# Patient Record
Sex: Male | Born: 1960 | Race: White | Hispanic: No | Smoking: Current some day smoker
Health system: Southern US, Community
[De-identification: ages and names within clinical notes are randomized; demographics above are authoritative.]

## PROBLEM LIST (undated history)

## (undated) DIAGNOSIS — J939 Pneumothorax, unspecified: Secondary | ICD-10-CM

## (undated) DIAGNOSIS — E039 Hypothyroidism, unspecified: Secondary | ICD-10-CM

## (undated) DIAGNOSIS — C3490 Malignant neoplasm of unspecified part of unspecified bronchus or lung: Secondary | ICD-10-CM

## (undated) DIAGNOSIS — E440 Moderate protein-calorie malnutrition: Secondary | ICD-10-CM

## (undated) DIAGNOSIS — F3181 Bipolar II disorder: Secondary | ICD-10-CM

## (undated) DIAGNOSIS — R0602 Shortness of breath: Secondary | ICD-10-CM

## (undated) HISTORY — PX: TURBINATE REDUCTION: SHX6157

## (undated) HISTORY — DX: Malignant neoplasm of unspecified part of unspecified bronchus or lung: C34.90

---

## 2005-11-12 ENCOUNTER — Emergency Department (HOSPITAL_COMMUNITY): Admission: EM | Admit: 2005-11-12 | Discharge: 2005-11-12 | Payer: Self-pay | Admitting: Family Medicine

## 2006-01-04 ENCOUNTER — Ambulatory Visit: Payer: Self-pay | Admitting: Family Medicine

## 2006-01-05 ENCOUNTER — Ambulatory Visit: Payer: Self-pay | Admitting: Nurse Practitioner

## 2006-01-05 ENCOUNTER — Ambulatory Visit: Payer: Self-pay | Admitting: *Deleted

## 2006-01-12 ENCOUNTER — Ambulatory Visit: Payer: Self-pay | Admitting: Family Medicine

## 2006-02-14 ENCOUNTER — Ambulatory Visit: Payer: Self-pay | Admitting: Family Medicine

## 2006-04-18 ENCOUNTER — Ambulatory Visit: Payer: Self-pay | Admitting: Family Medicine

## 2006-05-03 ENCOUNTER — Ambulatory Visit: Payer: Self-pay | Admitting: Family Medicine

## 2006-05-17 ENCOUNTER — Ambulatory Visit: Payer: Self-pay | Admitting: Family Medicine

## 2006-07-31 ENCOUNTER — Ambulatory Visit: Payer: Self-pay | Admitting: Family Medicine

## 2006-09-05 ENCOUNTER — Ambulatory Visit: Payer: Self-pay | Admitting: Family Medicine

## 2006-12-25 ENCOUNTER — Ambulatory Visit: Payer: Self-pay | Admitting: Family Medicine

## 2007-01-17 ENCOUNTER — Emergency Department (HOSPITAL_COMMUNITY): Admission: EM | Admit: 2007-01-17 | Discharge: 2007-01-17 | Payer: Self-pay | Admitting: Emergency Medicine

## 2007-09-05 ENCOUNTER — Encounter (INDEPENDENT_AMBULATORY_CARE_PROVIDER_SITE_OTHER): Payer: Self-pay | Admitting: *Deleted

## 2008-01-17 ENCOUNTER — Ambulatory Visit: Payer: Self-pay | Admitting: Internal Medicine

## 2012-11-25 ENCOUNTER — Inpatient Hospital Stay (HOSPITAL_COMMUNITY)
Admission: EM | Admit: 2012-11-25 | Discharge: 2012-12-01 | DRG: 181 | Disposition: A | Discharge status: Home Health | Payer: Medicaid Other | Source: Ambulatory Visit | Attending: Internal Medicine | Admitting: Internal Medicine

## 2012-11-25 ENCOUNTER — Emergency Department (HOSPITAL_COMMUNITY): Payer: Medicaid Other

## 2012-11-25 ENCOUNTER — Encounter (HOSPITAL_COMMUNITY): Payer: Self-pay | Admitting: Emergency Medicine

## 2012-11-25 DIAGNOSIS — J90 Pleural effusion, not elsewhere classified: Secondary | ICD-10-CM | POA: Diagnosis present

## 2012-11-25 DIAGNOSIS — F3181 Bipolar II disorder: Secondary | ICD-10-CM | POA: Diagnosis present

## 2012-11-25 DIAGNOSIS — Z720 Tobacco use: Secondary | ICD-10-CM | POA: Diagnosis present

## 2012-11-25 DIAGNOSIS — F17200 Nicotine dependence, unspecified, uncomplicated: Secondary | ICD-10-CM | POA: Diagnosis present

## 2012-11-25 DIAGNOSIS — F319 Bipolar disorder, unspecified: Secondary | ICD-10-CM | POA: Diagnosis present

## 2012-11-25 DIAGNOSIS — C3490 Malignant neoplasm of unspecified part of unspecified bronchus or lung: Secondary | ICD-10-CM | POA: Diagnosis present

## 2012-11-25 DIAGNOSIS — J910 Malignant pleural effusion: Principal | ICD-10-CM | POA: Diagnosis present

## 2012-11-25 DIAGNOSIS — K5900 Constipation, unspecified: Secondary | ICD-10-CM | POA: Diagnosis not present

## 2012-11-25 DIAGNOSIS — R0600 Dyspnea, unspecified: Secondary | ICD-10-CM | POA: Diagnosis present

## 2012-11-25 HISTORY — DX: Bipolar II disorder: F31.81

## 2012-11-25 LAB — COMPREHENSIVE METABOLIC PANEL
AST: 15 U/L (ref 0–37)
Albumin: 3.5 g/dL (ref 3.5–5.2)
Alkaline Phosphatase: 98 U/L (ref 39–117)
Chloride: 95 mEq/L — ABNORMAL LOW (ref 96–112)
Potassium: 4.3 mEq/L (ref 3.5–5.1)
Total Bilirubin: 0.4 mg/dL (ref 0.3–1.2)

## 2012-11-25 LAB — CBC
Platelets: 496 10*3/uL — ABNORMAL HIGH (ref 150–400)
RDW: 12.4 % (ref 11.5–15.5)
WBC: 9 10*3/uL (ref 4.0–10.5)

## 2012-11-25 LAB — APTT: aPTT: 29 seconds (ref 24–37)

## 2012-11-25 MED ORDER — ACETAMINOPHEN 650 MG RE SUPP: 650.0000 mg | Freq: Four times a day (QID) | RECTAL | Status: DC | PRN

## 2012-11-25 MED ORDER — SODIUM CHLORIDE 0.9 % IJ SOLN
3.0000 mL | Freq: Two times a day (BID) | INTRAMUSCULAR | Status: DC
  Administered 2012-11-25 – 2012-11-29 (×7): 3 mL via INTRAVENOUS

## 2012-11-25 MED ORDER — LAMOTRIGINE 150 MG PO TABS
150.0000 mg | ORAL_TABLET | Freq: Every day | ORAL | Status: DC
  Administered 2012-11-25 – 2012-11-30 (×6): 150 mg via ORAL
  Filled 2012-11-25 (×6): qty 1

## 2012-11-25 MED ORDER — KETOROLAC TROMETHAMINE 30 MG/ML IJ SOLN
10.0000 mg | Freq: Once | INTRAMUSCULAR | Status: AC
  Administered 2012-11-25: 9.9 mg via INTRAVENOUS
  Filled 2012-11-25: qty 1

## 2012-11-25 MED ORDER — PNEUMOCOCCAL VAC POLYVALENT 25 MCG/0.5ML IJ INJ
0.5000 mL | INJECTION | INTRAMUSCULAR | Status: AC
  Administered 2012-11-26: 0.5 mL via INTRAMUSCULAR
  Filled 2012-11-25: qty 0.5

## 2012-11-25 MED ORDER — PIPERACILLIN-TAZOBACTAM 3.375 G IVPB
3.3750 g | Freq: Three times a day (TID) | INTRAVENOUS | Status: DC
  Administered 2012-11-25: 3.375 g via INTRAVENOUS
  Filled 2012-11-25 (×3): qty 50

## 2012-11-25 MED ORDER — ONDANSETRON HCL 4 MG/2ML IJ SOLN: 4.0000 mg | Freq: Four times a day (QID) | INTRAMUSCULAR | Status: DC | PRN

## 2012-11-25 MED ORDER — NICOTINE 21 MG/24HR TD PT24
21.0000 mg | MEDICATED_PATCH | Freq: Every day | TRANSDERMAL | Status: DC
  Administered 2012-11-25 – 2012-11-28 (×4): 21 mg via TRANSDERMAL
  Filled 2012-11-25 (×6): qty 1

## 2012-11-25 MED ORDER — ALBUTEROL SULFATE (5 MG/ML) 0.5% IN NEBU: 2.5000 mg | INHALATION_SOLUTION | RESPIRATORY_TRACT | Status: DC | PRN

## 2012-11-25 MED ORDER — IOHEXOL 300 MG/ML  SOLN
100.0000 mL | Freq: Once | INTRAMUSCULAR | Status: AC | PRN
  Administered 2012-11-25: 100 mL via INTRAVENOUS

## 2012-11-25 MED ORDER — OXYCODONE HCL 5 MG PO TABS
5.0000 mg | ORAL_TABLET | ORAL | Status: DC | PRN
  Administered 2012-11-26 – 2012-12-01 (×10): 5 mg via ORAL
  Filled 2012-11-25 (×10): qty 1

## 2012-11-25 MED ORDER — IPRATROPIUM BROMIDE 0.02 % IN SOLN: 0.5000 mg | RESPIRATORY_TRACT | Status: DC | PRN

## 2012-11-25 MED ORDER — MIRTAZAPINE 45 MG PO TABS
45.0000 mg | ORAL_TABLET | Freq: Every day | ORAL | Status: DC
  Administered 2012-11-25 – 2012-11-30 (×6): 45 mg via ORAL
  Filled 2012-11-25 (×6): qty 1

## 2012-11-25 MED ORDER — ACETAMINOPHEN 325 MG PO TABS: 650.0000 mg | ORAL_TABLET | Freq: Four times a day (QID) | ORAL | Status: DC | PRN

## 2012-11-25 MED ORDER — MORPHINE SULFATE 2 MG/ML IJ SOLN: 2.0000 mg | INTRAMUSCULAR | Status: DC | PRN

## 2012-11-25 MED ORDER — ONDANSETRON HCL 4 MG PO TABS: 4.0000 mg | ORAL_TABLET | Freq: Four times a day (QID) | ORAL | Status: DC | PRN

## 2012-11-25 MED ORDER — SODIUM CHLORIDE 0.9 % IJ SOLN: 3.0000 mL | INTRAMUSCULAR | Status: DC | PRN

## 2012-11-25 MED ORDER — ASPIRIN 81 MG PO CHEW
324.0000 mg | CHEWABLE_TABLET | Freq: Once | ORAL | Status: AC
  Administered 2012-11-25: 324 mg via ORAL
  Filled 2012-11-25: qty 4

## 2012-11-25 MED ORDER — SODIUM CHLORIDE 0.9 % IV SOLN: 250.0000 mL | INTRAVENOUS | Status: DC | PRN

## 2012-11-25 NOTE — ED Notes (Signed)
Patient reports that he was trying to walk a short distance and became SOB, the chest pain has been around all this week but the associated SOB to day concerned him

## 2012-11-25 NOTE — H&P (Signed)
PCP:   No primary provider on file.   Chief Complaint:  Chest pain, cough, shortness of breath.   HPI: This is a 51 year old male, smoker, with history of recently diagnosed bipolar disorder, previous spontaneous right pneumothorax 04/2009, treated with chest tube at [facility], [place], [place], previous  ETOH abuse, quit about 4 years ago, presenting with intermittent right-sided chest pain, for the past week and a half, a cough, productive of yellowish phlegm, for one week, and progressive shortness of breath in the last week, which has got so bad, that since 11/23/12, he has had very poor effort tolerance, having to sit down frequently to rest, at work. He has become very fatigued, appetite has been poor in the last week, and he has lost about 4-5 lbs in that period of time. He denies fever or chills.   Allergies:  No Known Allergies    Past Medical History  Diagnosis Date  . Bipolar 2 disorder     No past surgical history on file.  Prior to Admission medications   Medication Sig Start Date End Date Taking? Authorizing Provider  lamoTRIgine (LAMICTAL) 150 MG tablet Take 150 mg by mouth at bedtime.   Yes Historical Provider, MD  mirtazapine (REMERON) 45 MG tablet Take 45 mg by mouth at bedtime.   Yes Historical Provider, MD  naproxen sodium (ANAPROX) 220 MG tablet Take 220 mg by mouth 2 (two) times daily with a meal.   Yes Historical Provider, MD    Social History: Patient is a Education administrator, single, has 2 offspring. He reports that he has been smoking.  He does not have any smokeless tobacco history on file. He reports that he does not drink alcohol or use illicit drugs.  Family History:  Mother is 22 years old, with breast cancer. Father is 64 years old with HTN.   Review of Systems:  As per HPI and chief complaint. Patent has fatigue, diminished appetite, weight loss, but no fever, chills, headache, blurred vision, difficulty in speaking, dysphagia. He has right-sided chest pain,  cough, shortness of breath, but denies orthopnea, paroxysmal nocturnal dyspnea, nausea, diaphoresis, abdominal pain, vomiting, diarrhea, belching, heartburn, hematemesis, melena, dysuria, nocturia, urinary frequency, hematochezia, lower extremity swelling, pain, or redness. The rest of the systems review is negative.  Physical Exam:  General:  Patient looks thin, does not appear to be in obvious acute distress. Alert, communicative, fully oriented, talking in complete sentences, not short of breath at rest.  HEENT:  No clinical pallor, no jaundice, no conjunctival injection or discharge. Hydration status is fair.  NECK:  Supple, JVP not seen, no carotid bruits, no palpable lymphadenopathy, no palpable goiter. CHEST:  Clinically clear to auscultation, no wheezes, no crackles. Breath sounds are absent in right base, and there is stony dullness to percussion.  HEART:  Sounds 1 and 2 heard, normal, regular, no murmurs. ABDOMEN:  Flat, soft, non-tender, no palpable organomegaly, no palpable masses, normal bowel sounds. GENITALIA:  Not examined. LOWER EXTREMITIES:  No pitting edema, palpable peripheral pulses. MUSCULOSKELETAL SYSTEM:  Unremarkable. No digital clubbing in upper extremities.  CENTRAL NERVOUS SYSTEM:  No focal neurologic deficit on gross examination.  Labs on Admission:  Results for orders placed during the hospital encounter of 11/25/12 (from the past 48 hour(s))  CBC     Status: Abnormal   Collection Time   11/25/12 10:01 AM      Component Value Range Comment   WBC 9.0  4.0 - 10.5 K/uL  RBC 4.88  4.22 - 5.81 MIL/uL    Hemoglobin 15.5  13.0 - 17.0 g/dL    HCT 16.1  09.6 - 04.5 %    MCV 92.4  78.0 - 100.0 fL    MCH 31.8  26.0 - 34.0 pg    MCHC 34.4  30.0 - 36.0 g/dL    RDW 40.9  81.1 - 91.4 %    Platelets 496 (*) 150 - 400 K/uL   COMPREHENSIVE METABOLIC PANEL     Status: Abnormal   Collection Time   11/25/12 10:01 AM      Component Value Range Comment   Sodium 134 (*) 135 -  145 mEq/L    Potassium 4.3  3.5 - 5.1 mEq/L    Chloride 95 (*) 96 - 112 mEq/L    CO2 26  19 - 32 mEq/L    Glucose, Bld 96  70 - 99 mg/dL    BUN 9  6 - 23 mg/dL    Creatinine, Ser 7.82  0.50 - 1.35 mg/dL    Calcium 9.5  8.4 - 95.6 mg/dL    Total Protein 7.2  6.0 - 8.3 g/dL    Albumin 3.5  3.5 - 5.2 g/dL    AST 15  0 - 37 U/L    ALT 12  0 - 53 U/L    Alkaline Phosphatase 98  39 - 117 U/L    Total Bilirubin 0.4  0.3 - 1.2 mg/dL    GFR calc non Af Amer >90  >90 mL/min    GFR calc Af Amer >90  >90 mL/min   PROTIME-INR     Status: Normal   Collection Time   11/25/12 10:01 AM      Component Value Range Comment   Prothrombin Time 12.4  11.6 - 15.2 seconds    INR 0.93  0.00 - 1.49   POCT I-STAT TROPONIN I     Status: Normal   Collection Time   11/25/12 10:42 AM      Component Value Range Comment   Troponin i, poc 0.01  0.00 - 0.08 ng/mL    Comment 3              Radiological Exams on Admission: *RADIOLOGY REPORT*  Clinical Data: Chest pain, shortness of breath  CHEST - 2 VIEW  Comparison: None.  Findings: Cardiomediastinal silhouette is unremarkable. There is  moderate to large right pleural effusion with right lower lobe  atelectasis atelectasis or infiltrate. Left lung is clear.  IMPRESSION:  Moderate to large right pleural effusion with right lower lobe  atelectasis or infiltrate.  Original Report Authenticated By: Natasha Mead, M.D.   *RADIOLOGY REPORT*  Clinical Data: Chest pain, shortness of breath  CT CHEST WITH CONTRAST  Technique: Multidetector CT imaging of the chest was performed  following the standard protocol during bolus administration of  intravenous contrast.  Contrast: OMNIPAQUE IOHEXOL 300 MG/ML SOLN  Comparison: Chest x-ray same day.  Findings: No destructive bony lesions are noted. Central airways  are patent. Bilateral apical large paraseptal emphysematous bullae  are noted. There is a large right pleural effusion. There is  significant atelectasis  in the right middle lobe partially right  upper lobe and right lower lobe.  There is some pleural thickening in the right midlung posteriorly  best seen in axial image 44. Malignant pleural effusion cannot be  excluded. Correlation with fluid analysis and cytology is  recommended. No definite enhancing lung mass is noted. There is  some low density in the right lower lobe centrally measures about 2  cm. Called to mass cannot be excluded. Further evaluation with  bronchoscopy or repeat CT examination after lung re-expansion is  recommended to exclude a subtle mass. A subcarinal lymph node  measures 2.4 x 1.7 cm. There is a right hilar lymph node measures  2.2 x 1.8 cm. Pretracheal lymph node measures 2.3 x 1.9 cm. There  is a right anterior mediastinal lymph node measures 1.9 x 1.5 cm.  A right paratracheal lymph node measures 1.6 x 1.4 cm. Small  pericardial effusion. There is a right anterior diaphragmatic  nodule or lymph node best seen in axial image 56 measures 1.1 cm.  Pathologic adenopathy or metastatic disease cannot be excluded.  No central pulmonary embolus is noted. Left lung is clear.  Minimal atelectasis noted in the left lower lobe.  IMPRESSION:  1. A large right pleural effusion is noted. There is some pleural  thickening in the right lung posteriorly. Highly suspicious for  malignant pleural effusion. Correlation with fluid analysis and  cytology is recommended. There is atelectasis of the right lower  lobe right middle lobe and partial atelectasis of the right upper  lobe.  2. There is mediastinal and right hilar adenopathy.  3. No definite enhancing lung mass is noted. There is some  central low density within the right lower lobe. A occult mass  cannot be excluded. Correlation with bronchoscopy or repeat CT scan  after lung re-expansion is recommended.  4. A right anterior diaphragmatic nodule or lymph node measures  1.1 cm.  Metastatic adenopathy cannot be  excluded.  Original Report Authenticated By: Natasha Mead, M.D.      Assessment/Plan Active Problems:    1. Pleural effusion, right: Etiology is unclear. Patient presented with a 10-day history of intermittent right sided chest pain, and progressive dyspnea, associated with a productive cough, fatigue, poor appetite and 4-5 lb weight loss. CXR demonstrated a large right pleural effusion, confirmed by follow up chest CT scan, and associated with pleural thickening in the right lung posteriorly, highly suspicious for malignant pleural effusion. There is atelectasis of the right lower Lobe, right middle lobe and partial atelectasis of the right upper Lobe, mediastinal and right hilar adenopathy and some central low density within the right lower lobe, although no enhancing lung mass is seen. Given patient's smoking history, there is a high index of suspicion for malignant disease. We shall consult interventional radiologist, to perform a diagnostic/therapeutic right thoracocentesis, with cell count, LDH, protein, Glucose, gram stain, as well as mycobacterial stain and cytology. Pulmonology consultation will be requested. Meanwhile, will cover patient empirically with Zosyn, in view of productive cough. 2. Tobacco abuse: Patient smokes about a pack of cigarettes per day. He has been counseled appropriately, and commenced on Nicoderm CQ patch.  3. Dyspnea: This is multi-factorial, secondary to right pleural effusion, possible chest infection and pre-existent COPD. Will manage as described in #1above, and utilize prn bronchodilators.  4. Bipolar 1 disorder: This appears stable at this time.   Further management will depend on clinical course.   Comment: Patient is FULL CODE.     Time Spent on Admission: 45 mins.   Jw Covin,CHRISTOPHER 11/25/2012, 3:09 PM

## 2012-11-25 NOTE — ED Provider Notes (Signed)
History     CSN: [id number]  Arrival date & time 11/25/12  1191   First MD Initiated Contact with Patient 11/25/12 0932      Chief Complaint  Patient presents with  . Chest Pain  . Shortness of Breath    (Consider location/radiation/quality/duration/timing/severity/associated sxs/prior treatment) HPI  The patient presents with concerns of chest pain, dyspnea, cough.  Symptoms actually began approximately one week ago with chest pain.  Since onset there has been pain intermittently in the right chest, right axilla.  The pain is sore dominant nonradiating.  There no clear alleviating or exacerbating factors.  Over the past 24 hours the patient has also developed dyspnea.  This is worse with exertion, better with rest.  He denies pleuritic pain.  He denies fever, chills.  Does endorse mild cough. The patient has a history of smoking.  He also has a history of prior spontaneous pneumothorax.  Past Medical History  Diagnosis Date  . Bipolar 2 disorder     No past surgical history on file.  No family history on file.  History  Substance Use Topics  . Smoking status: Current Every Day Smoker  . Smokeless tobacco: Not on file  . Alcohol Use:       Review of Systems  Constitutional:       Per HPI, otherwise negative  HENT:       Per HPI, otherwise negative  Eyes: Negative.   Respiratory:       Per HPI, otherwise negative  Cardiovascular:       Per HPI, otherwise negative  Gastrointestinal: Negative for vomiting.  Genitourinary: Negative.   Musculoskeletal:       Per HPI, otherwise negative  Skin: Negative.   Neurological: Negative for syncope.    Allergies  Review of patient's allergies indicates not on file.  Home Medications  No current outpatient prescriptions on file.  BP 107/77  Pulse 111  Temp 97.8 F (36.6 C) (Oral)  Resp 20  SpO2 100%  Physical Exam  Nursing note and vitals reviewed. Constitutional: He is oriented to person, place, and time. He  appears well-developed. No distress.  HENT:  Head: Normocephalic and atraumatic.  Eyes: Conjunctivae normal and EOM are normal.  Cardiovascular: Regular rhythm.  Tachycardia present.   Pulmonary/Chest: Effort normal. No stridor. No respiratory distress. He has decreased breath sounds in the right upper field, the right middle field and the right lower field.       Cigarette smell   Abdominal: He exhibits no distension.  Musculoskeletal: He exhibits no edema.  Neurological: He is alert and oriented to person, place, and time.  Skin: Skin is warm and dry.  Psychiatric: He has a normal mood and affect.    ED Course  Procedures (including critical care time)   Labs Reviewed  CBC  COMPREHENSIVE METABOLIC PANEL  PROTIME-INR   No results found.   No diagnosis found.  Cardiac: 121 - st, abnormal  O2- 96%ra, normal   Date: 11/25/2012  Rate: 113  Rhythm: sinus tachycardia  QRS Axis: normal  Intervals: normal  ST/T Wave abnormalities: nonspecific ST changes  Conduction Disutrbances:none  Narrative Interpretation:   Old EKG Reviewed: none available  ABNORMAL   I interpreted the CXR and discussed the findings with our radiologist.  Subsequent CT was also discussed.  I informed the patient of all results. MDM  This patient with a long smoking history presents with new chest pain, dyspnea.  On exam the patient is tachycardic, but  in no distress.  No after initial evaluation demonstrated a pleural effusion, subsequent CT was performed.  This was concerning for malignancy.  The patient was admitted for further E/M.  Gerhard Munch, MD 11/25/12 [phone]

## 2012-11-25 NOTE — Progress Notes (Signed)
ANTIBIOTIC CONSULT NOTE - INITIAL  Pharmacy Consult for Zosyn Indication: pneumonia  No Known Allergies  Patient Measurements: Height: 6\' 3"  (190.5 cm) (as reported to RN) Weight: 170 lb (77.111 kg) (as reported to RN) IBW/kg (Calculated) : 84.5    Vital Signs: Temp: 97.6 F (36.4 C) (12/08 1328) Temp src: Oral (12/08 1328) BP: 112/78 mmHg (12/08 1328) Pulse Rate: 94  (12/08 1328) Intake/Output from previous day:   Intake/Output from this shift:    Labs:  Basename 11/25/12 1001  WBC 9.0  HGB 15.5  PLT 496*  LABCREA --  CREATININE 0.90   Estimated Creatinine Clearance: 105.9 ml/min (by C-G formula based on Cr of 0.9). No results found for this basename: VANCOTROUGH:2,VANCOPEAK:2,VANCORANDOM:2,GENTTROUGH:2,GENTPEAK:2,GENTRANDOM:2,TOBRATROUGH:2,TOBRAPEAK:2,TOBRARND:2,AMIKACINPEAK:2,AMIKACINTROU:2,AMIKACIN:2, in the last 72 hours   Microbiology: No results found for this or any previous visit (from the past 720 hour(s)).  Medical History: Past Medical History  Diagnosis Date  . Bipolar 2 disorder     Medications:  Scheduled:    . [COMPLETED] aspirin  324 mg Oral Once  . [COMPLETED] ketorolac  9.9 mg Intravenous Once  . lamoTRIgine  150 mg Oral QHS  . mirtazapine  45 mg Oral QHS  . nicotine  21 mg Transdermal Daily  . sodium chloride  3 mL Intravenous Q12H   Infusions:   PRN: sodium chloride, acetaminophen, acetaminophen, albuterol, [COMPLETED] iohexol, ipratropium, morphine injection, ondansetron (ZOFRAN) IV, ondansetron, oxyCODONE, sodium chloride Assessment: .51 yo male presenting with intermittent right sided chest pain and productive cough x 1 week and progressive shortness of breath in the last week. Marland Kitchen Zosyn to be started empirically for pneumonia  Plan:  . Will start Zosyn 3.375Gm IV Q8h to be given over 4 hours . Will f/u lung fluid Cx and Scr  Dorethea Clan 11/25/2012,3:48 PM

## 2012-11-26 ENCOUNTER — Inpatient Hospital Stay (HOSPITAL_COMMUNITY): Payer: Medicaid Other

## 2012-11-26 DIAGNOSIS — R0989 Other specified symptoms and signs involving the circulatory and respiratory systems: Secondary | ICD-10-CM

## 2012-11-26 DIAGNOSIS — J90 Pleural effusion, not elsewhere classified: Secondary | ICD-10-CM

## 2012-11-26 DIAGNOSIS — F17200 Nicotine dependence, unspecified, uncomplicated: Secondary | ICD-10-CM

## 2012-11-26 LAB — COMPREHENSIVE METABOLIC PANEL
ALT: 9 U/L (ref 0–53)
BUN: 9 mg/dL (ref 6–23)
CO2: 27 mEq/L (ref 19–32)
Calcium: 9 mg/dL (ref 8.4–10.5)
Creatinine, Ser: 0.94 mg/dL (ref 0.50–1.35)
GFR calc Af Amer: >90 mL/min (ref >90)
GFR calc non Af Amer: >90 mL/min (ref >90)
Glucose, Bld: 89 mg/dL (ref 70–99)
Sodium: 138 mEq/L (ref 135–145)

## 2012-11-26 LAB — LACTATE DEHYDROGENASE, PLEURAL OR PERITONEAL FLUID: LD, Fluid: 377 U/L — ABNORMAL HIGH (ref 3–23)

## 2012-11-26 LAB — CBC
Hemoglobin: 15 g/dL (ref 13.0–17.0)
MCHC: 35 g/dL (ref 30.0–36.0)
WBC: 9.1 10*3/uL (ref 4.0–10.5)

## 2012-11-26 LAB — BODY FLUID CELL COUNT WITH DIFFERENTIAL
Eos, Fluid: 5 %
Lymphs, Fluid: 30 %
Neutrophil Count, Fluid: 4 % (ref 0–25)
Other Cells, Fluid: 0 %

## 2012-11-26 LAB — CHOLESTEROL, BODY FLUID

## 2012-11-26 LAB — GLUCOSE, SEROUS FLUID

## 2012-11-26 LAB — TSH: TSH: 0.826 u[IU]/mL (ref 0.350–4.500)

## 2012-11-26 MED ORDER — PIPERACILLIN-TAZOBACTAM 3.375 G IVPB
3.3750 g | Freq: Three times a day (TID) | INTRAVENOUS | Status: DC
  Administered 2012-11-26 – 2012-12-01 (×14): 3.375 g via INTRAVENOUS
  Filled 2012-11-26 (×15): qty 50

## 2012-11-26 NOTE — Progress Notes (Signed)
[facility] PROGRESS NOTE  Jay Watts UJW:[id number] DOB: July 22, 1961 DOA: 11/25/2012 PCP: No primary provider on file.  Assessment/Plan: Active Problems:  Pleural effusion, right colon for diagnostic/therapeutic thoracentesis today. Appreciate pulmonary assistance. Followup chest x-ray tomorrow to assess if further thoracentesis needed. Antibiotics continued for right now, although a lower suspicion for infectious with malignancy be more likely.  Tobacco abuse: Counseled  Bipolar 1 disorder: Stable  Dyspnea: Secondary to effusion   Code Status: Full Family Communication: Plan discussed with patient at the bedside Disposition Plan: Likely home possibly tomorrow   Consultants:  [facility]  Procedures:  Status post thoracentesis of right lung on 12/9 yielding 1.5 L of fluid  Antibiotics:  IV Zosyn day 2  HPI/Subjective: Patient seen prior to thoracentesis. He states his breathing is stable and he is comfortable on room air as long as he does not move. Even slight exertion such as replacing his SCDs leads to heavy wending when he is off of oxygen. No chest pain.  Objective: Filed Vitals:   11/25/12 1328 11/25/12 1547 11/25/12 1900 11/26/12 0500  BP: 112/78  103/71 101/72  Pulse: 94  89 87  Temp: 97.6 F (36.4 C)  98.1 F (36.7 C) 97.7 F (36.5 C)  TempSrc: Oral  Oral Oral  Resp: 20  18 18   Height:  6\' 3"  (1.905 m)    Weight:  77.111 kg (170 lb)    SpO2: 98%  97% 98%    Intake/Output Summary (Last 24 hours) at 11/26/12 1429 Last data filed at 11/26/12 0930  Gross per 24 hour  Intake    420 ml  Output    500 ml  Net    -80 ml   Filed Weights   11/25/12 1547  Weight: 77.111 kg (170 lb)    Exam:   General:  Alert and oriented x3, no acute distress, fatigue  Cardiovascular: Regular rate and rhythm, S1-S2  Respiratory: Clear to auscultation bilaterally with decreased breath sounds on right side  Abdomen: Soft, nontender,  nondistended, positive bowel sounds  Extremities: No clubbing or cyanosis or edema  Data Reviewed: Basic Metabolic Panel:  Lab 11/26/12 5621 11/25/12 1001  NA 138 134*  K 4.6 4.3  CL 101 95*  CO2 27 26  GLUCOSE 89 96  BUN 9 9  CREATININE 0.94 0.90  CALCIUM 9.0 9.5  MG -- --  PHOS -- --   Liver Function Tests:  Lab 11/26/12 0500 11/25/12 1001  AST 13 15  ALT 9 12  ALKPHOS 82 98  BILITOT 0.3 0.4  PROT 6.0 7.2  ALBUMIN 2.8* 3.5   CBC:  Lab 11/26/12 0500 11/25/12 1001  WBC 9.1 9.0  NEUTROABS -- --  HGB 15.0 15.5  HCT 42.8 45.1  MCV 92.4 92.4  PLT 448* 496*     Recent Results (from the past 240 hour(s))  MRSA PCR SCREENING     Status: Normal   Collection Time   11/25/12  6:31 PM      Component Value Range Status Comment   MRSA by PCR NEGATIVE  NEGATIVE Final      Studies: Dg Chest 2 View  11/25/2012    IMPRESSION: Moderate to large right pleural effusion with right lower lobe atelectasis or infiltrate.   Original Report Authenticated By: Natasha Mead, M.D.    Ct Chest W Contrast  11/25/2012  .  IMPRESSION:  1.  A large right pleural effusion is noted.  There is some pleural thickening in the right lung posteriorly.  Highly suspicious for malignant pleural effusion.  Correlation with fluid analysis and cytology is recommended.  There is atelectasis of the right lower lobe right middle lobe and partial atelectasis of the right upper lobe. 2.  There is mediastinal and right hilar adenopathy. 3.  No definite enhancing lung mass is noted.  There is some central low density within the right lower lobe.  A occult mass cannot be excluded. Correlation with bronchoscopy or repeat CT scan after lung re-expansion is recommended. 4.  A right anterior diaphragmatic nodule or lymph node measures 1.1 cm. Metastatic adenopathy cannot be excluded.   Original Report Authenticated By: Natasha Mead, M.D.     Scheduled Meds:   . lamoTRIgine  150 mg Oral QHS  . mirtazapine  45 mg Oral QHS   . nicotine  21 mg Transdermal Daily  . piperacillin-tazobactam (ZOSYN)  IV  3.375 g Intravenous Q8H  . [COMPLETED] pneumococcal 23 valent vaccine  0.5 mL Intramuscular Tomorrow-1000  . sodium chloride  3 mL Intravenous Q12H  . [DISCONTINUED] piperacillin-tazobactam (ZOSYN)  IV  3.375 g Intravenous Q8H   Continuous Infusions:   Active Problems:  Pleural effusion, right  Tobacco abuse  Bipolar 1 disorder  Dyspnea    Time spent: 20 minutes    [name]  [facility] Pager [phone]. If 8PM-8AM, please contact night-coverage at [email or web address], password [facility] 11/26/2012, 2:29 PM  LOS: 1 day

## 2012-11-26 NOTE — Procedures (Signed)
Thoracentesis Procedure Note  Pre-operative Diagnosis: Right sided pleural effusion  Post-operative Diagnosis: normal  Indications: Right sided pleural effusion  Procedure Details  Consent: Informed consent was obtained. Risks of the procedure were discussed including: infection, bleeding, pain, pneumothorax.  Under sterile conditions the patient was positioned. Betadine solution and sterile drapes were utilized.  1% plain lidocaine was used to anesthetize the 7 rib space. Fluid was obtained without any difficulties and minimal blood loss.  A dressing was applied to the wound and wound care instructions were provided.   Findings 1500 ml of clear pleural fluid was obtained. A sample was sent to Pathology for cytogenetics, flow, and cell counts, as well as for infection analysis.  Complications:  None; patient tolerated the procedure well.          Condition: stable  Plan A follow up chest x-ray was ordered. Bed Rest for 2 hours. Tylenol 650 mg. for pain.  U/S used in procedure.  Attending Attestation: I performed the procedure.  Alyson Reedy, M.D. [facility]monary[facility] Medicine. Pager: [phone]. After hours pager: [phone].

## 2012-11-26 NOTE — Consult Note (Signed)
PULMONARY  / CRITICAL CARE MEDICINE  Name: Jay Watts MRN: [id number] DOB: 1961/06/19    LOS: 1  REFERRING PROVIDER:  TR[facility]  CHIEF COMPLAINT:  Chest Pain  BRIEF PATIENT DESCRIPTION: 51 year old male smoker with PMH of spontaneous PTX in 2010 treated with a  Chest tube that was attributed to bleb rupture, presenting with two week history of CP on the right.  In the ED a CXR was performed followed by a chest CT that revealed a large right sided pleural effusion with compressive atelectasis of the right lung.  PCCM was called for a thora and evaluation of pleural fluid.  Chest pain was pleuritic in nature with no exertional component and was positional.  Minimal SOB.  LINES / TUBES: PIV  CULTURES: Pleural Fluid 12/9>>> Blood 12/8>>>  ANTIBIOTICS: Zosyn 12/8>>>  SIGNIFICANT EVENTS:  12/8>>>Chest CT with a large right sided pleural effusion.  PAST MEDICAL HISTORY :  Past Medical History  Diagnosis Date  . Bipolar 2 disorder    History reviewed. No pertinent past surgical history. Prior to Admission medications   Medication Sig Start Date End Date Taking? Authorizing Provider  lamoTRIgine (LAMICTAL) 150 MG tablet Take 150 mg by mouth at bedtime.   Yes Historical Provider, MD  mirtazapine (REMERON) 45 MG tablet Take 45 mg by mouth at bedtime.   Yes Historical Provider, MD  naproxen sodium (ANAPROX) 220 MG tablet Take 220 mg by mouth 2 (two) times daily with a meal.   Yes Historical Provider, MD   No Known Allergies  FAMILY HISTORY:  History reviewed. No pertinent family history. SOCIAL HISTORY:  reports that he has been smoking Cigarettes.  He has been smoking about 1 pack per day. He does not have any smokeless tobacco history on file. He reports that he does not drink alcohol or use illicit drugs.  REVIEW OF SYSTEMS:   Constitutional: Negative for fever, chills, weight loss, malaise/fatigue and diaphoresis.  HENT: Negative for hearing loss, ear pain, nosebleeds,  congestion, sore throat, neck pain, tinnitus and ear discharge.   Eyes: Negative for blurred vision, double vision, photophobia, pain, discharge and redness.  Respiratory: Negative for cough, hemoptysis, sputum production, shortness of breath, wheezing and stridor.   Cardiovascular: Negative for chest pain, palpitations, orthopnea, claudication, leg swelling and PND.  Gastrointestinal: Negative for heartburn, nausea, vomiting, abdominal pain, diarrhea, constipation, blood in stool and melena.  Genitourinary: Negative for dysuria, urgency, frequency, hematuria and flank pain.  Musculoskeletal: Negative for myalgias, back pain, joint pain and falls.  Skin: Negative for itching and rash.  Neurological: Negative for dizziness, tingling, tremors, sensory change, speech change, focal weakness, seizures, loss of consciousness, weakness and headaches.  Endo/Heme/Allergies: Negative for environmental allergies and polydipsia. Does not bruise/bleed easily.  INTERVAL HISTORY:   VITAL SIGNS: Temp:  [97.7 F (36.5 C)-98.1 F (36.7 C)] 97.7 F (36.5 C) (12/09 0500) Pulse Rate:  [87-89] 87  (12/09 0500) Resp:  [18] 18  (12/09 0500) BP: (101-103)/(71-72) 101/72 mmHg (12/09 0500) SpO2:  [97 %-98 %] 98 % (12/09 0500) Weight:  [77.111 kg (170 lb)] 77.111 kg (170 lb) (12/08 1547)  PHYSICAL EXAMINATION: General:  Thin tall, not chronically ill appearing. Neuro:  Alert, oriented and interactive. HEENT:  [place]/AT, PERRL, EOM-I and MMM. Neck:  Supple, -LAN and -thyromegally and -JVD. Cardiovascular:  RRR, Nl S1/S2, -M/R/G. Lungs:  No audible BS on the right, left lung with normal BS. Abdomen:  Soft, NT, ND and +BS. Musculoskeletal:  -edema and -tenderness. Skin:  Intact.  Lab 11/26/12 0500 11/25/12 1001  NA 138 134*  K 4.6 4.3  CL 101 95*  CO2 27 26  BUN 9 9  CREATININE 0.94 0.90  GLUCOSE 89 96    Lab 11/26/12 0500 11/25/12 1001  HGB 15.0 15.5  HCT 42.8 45.1  WBC 9.1 9.0  PLT 448* 496*   Dg  Chest 2 View  11/25/2012  *RADIOLOGY REPORT*  Clinical Data: Chest pain, shortness of breath  CHEST - 2 VIEW  Comparison: None.  Findings: Cardiomediastinal silhouette is unremarkable.  There is moderate to large right pleural effusion with right lower lobe atelectasis atelectasis or infiltrate.  Left lung is clear.  IMPRESSION: Moderate to large right pleural effusion with right lower lobe atelectasis or infiltrate.   Original Report Authenticated By: Natasha Mead, M.D.    Ct Chest W Contrast  11/25/2012  *RADIOLOGY REPORT*  Clinical Data: Chest pain, shortness of breath  CT CHEST WITH CONTRAST  Technique:  Multidetector CT imaging of the chest was performed following the standard protocol during bolus administration of intravenous contrast.  Contrast: OMNIPAQUE IOHEXOL 300 MG/ML  SOLN  Comparison: Chest x-ray same day.  Findings: No destructive bony lesions are noted.  Central airways are patent.  Bilateral apical large paraseptal emphysematous bullae are noted.  There is a large right pleural effusion.  There is significant atelectasis in the right middle lobe partially right upper lobe and right lower lobe.  There is some pleural thickening in the right midlung posteriorly best seen in axial image 44.  Malignant pleural effusion cannot be excluded.  Correlation with fluid analysis and cytology is recommended.  No definite enhancing lung mass is noted.  There is some low density in the right lower lobe centrally measures about 2 cm.  Called to mass cannot be excluded.  Further evaluation with bronchoscopy or repeat CT examination after lung re-expansion is recommended to exclude a subtle mass.  A subcarinal lymph node measures 2.4 x 1.7 cm.  There is a right hilar lymph node measures 2.2 x 1.8 cm.  Pretracheal lymph node measures 2.3 x 1.9 cm.  There is a right anterior mediastinal lymph node measures 1.9 x 1.5 cm. A right paratracheal lymph node measures 1.6 x 1.4 cm.  Small pericardial effusion.  There  is a right anterior diaphragmatic nodule or lymph node best seen in axial image 56 measures 1.1 cm. Pathologic adenopathy or metastatic disease cannot be excluded.  No central pulmonary embolus is noted.  Left lung is clear. Minimal atelectasis noted in the left lower lobe.  IMPRESSION:  1.  A large right pleural effusion is noted.  There is some pleural thickening in the right lung posteriorly.  Highly suspicious for malignant pleural effusion.  Correlation with fluid analysis and cytology is recommended.  There is atelectasis of the right lower lobe right middle lobe and partial atelectasis of the right upper lobe. 2.  There is mediastinal and right hilar adenopathy. 3.  No definite enhancing lung mass is noted.  There is some central low density within the right lower lobe.  A occult mass cannot be excluded. Correlation with bronchoscopy or repeat CT scan after lung re-expansion is recommended. 4.  A right anterior diaphragmatic nodule or lymph node measures 1.1 cm. Metastatic adenopathy cannot be excluded.   Original Report Authenticated By: Natasha Mead, M.D.     ASSESSMENT / PLAN:  51 year old male smoker with a large right sided pleural effusion with some evidence of loculation on the  anterior upper part of the the right chest cavity.  There are some nodes on CT that are suspicious for malignancy specially with the history of smoking.  The patient has a large effusion and will perform a thora today.  Will send for cytology but will likely need multiple thoras.  Will f/u in AM to decide if a repeat thora is needed.  Would be a therapeutic rather than a diagnostic thora.  Abx choice I agree with even though I doubt that this is infectious but at least until ruled out.  O2 as needed.  Pulmonary hygienes.  Alyson Reedy, M.D. [facility] Pulmonary/Critical Care Medicine. Pager: [phone]. After hours pager: [phone].

## 2012-11-27 ENCOUNTER — Inpatient Hospital Stay (HOSPITAL_COMMUNITY): Payer: Medicaid Other

## 2012-11-27 DIAGNOSIS — K5900 Constipation, unspecified: Secondary | ICD-10-CM | POA: Diagnosis not present

## 2012-11-27 LAB — FUNGAL STAIN: Fungal Smear: NONE SEEN

## 2012-11-27 MED ORDER — POLYETHYLENE GLYCOL 3350 17 G PO PACK
17.0000 g | PACK | Freq: Every day | ORAL | Status: DC | PRN
  Administered 2012-11-27 – 2012-12-01 (×3): 17 g via ORAL
  Filled 2012-11-27 (×2): qty 1

## 2012-11-27 NOTE — Progress Notes (Signed)
[facility] PROGRESS NOTE  Jay Watts YNW:[id number] DOB: 06-09-1961 DOA: 11/25/2012 PCP: No primary provider on file.  Interim summary: 51 year old white male with past medical history tobacco abuse and bipolar disorder who presented with several days of worsening shortness of breath. His son have a large right pleural effusion concerning for malignancy. Patient was started on antibiotics and taste that this was infectious. Pulmonary was consulted who performed a thoracentesis on 12/9. Fluid was sent for cytology is still pending. 1.5 L were removed the patient had some improvement. Repeat x-rays note still persistence of significant amount of pleural effusion. Mary followed up on 12/10 with plans to check on 12/11 if cytology is back. If it is not, plan is for a repeat thoracentesis and followup CT scan.   Assessment/Plan: Active Problems:  Pleural effusion, right: Status post thoracentesis done 12/9 with cytology pending. If it is not back by 12/11 morning, repeat thoracentesis planned with followup CT after. High suspicion for malignancy.    Tobacco abuse: Counseled   Bipolar 1 disorder: Stable   Dyspnea: Secondary to effusion  Constipation: Prn miralax.   Code Status: Full Family Communication: Plan discussed with patient at the bedside Disposition Plan:  home hopefully in the next few days   Consultants:  *Dr.Yakoub-Pulmonary Medicine  Procedures:  Status post thoracentesis of right lung on 12/9 yielding 1.5 L of fluid  Antibiotics:  IV Zosyn day 3  HPI/Subjective: Patient doing okay. Breathing little bit easier after first thoracentesis. No chest pain or shortness of breath.  Mildly constipated.  Objective: Filed Vitals:   11/26/12 1446 11/26/12 2126 11/27/12 0531 11/27/12 1325  BP: 117/72 107/76 99/69 106/61  Pulse: 86 103 93 90  Temp: 97.2 F (36.2 C) 98.1 F (36.7 C) 98.4 F (36.9 C)   TempSrc: Oral Oral Oral Oral  Resp: 20 20 20 20   Height:       Weight:      SpO2: 97% 94% 91% 94%    Intake/Output Summary (Last 24 hours) at 11/27/12 1803 Last data filed at 11/27/12 1247  Gross per 24 hour  Intake    510 ml  Output      2 ml  Net    508 ml   Filed Weights   11/25/12 1547  Weight: 77.111 kg (170 lb)    Exam:   General:  Alert and oriented x3, no acute distress, fatigue  Cardiovascular: Regular rate and rhythm, S1-S2  Respiratory: Clear to auscultation bilaterally with decreased breath sounds on right side  Abdomen: Soft, nontender, nondistended, positive bowel sounds  Extremities: No clubbing or cyanosis or edema  Data Reviewed: Basic Metabolic Panel:  Lab 11/26/12 6578 11/25/12 1001  NA 138 134*  K 4.6 4.3  CL 101 95*  CO2 27 26  GLUCOSE 89 96  BUN 9 9  CREATININE 0.94 0.90  CALCIUM 9.0 9.5  MG -- --  PHOS -- --   Liver Function Tests:  Lab 11/27/12 1635 11/26/12 0500 11/25/12 1001  AST -- 13 15  ALT -- 9 12  ALKPHOS -- 82 98  BILITOT -- 0.3 0.4  PROT 6.6 6.0 7.2  ALBUMIN -- 2.8* 3.5   CBC:  Lab 11/26/12 0500 11/25/12 1001  WBC 9.1 9.0  NEUTROABS -- --  HGB 15.0 15.5  HCT 42.8 45.1  MCV 92.4 92.4  PLT 448* 496*     Recent Results (from the past 240 hour(s))  MRSA PCR SCREENING     Status: Normal   Collection Time  11/25/12  6:31 PM      Component Value Range Status Comment   MRSA by PCR NEGATIVE  NEGATIVE Final   AFB CULTURE WITH SMEAR     Status: Normal (Preliminary result)   Collection Time   11/26/12  2:17 PM      Component Value Range Status Comment   Specimen Description LUNG   Final    Special Requests Normal   Final    ACID FAST SMEAR NO ACID FAST BACILLI SEEN   Final    Culture     Final    Value: CULTURE WILL BE EXAMINED FOR 6 WEEKS BEFORE ISSUING A FINAL REPORT   Report Status PENDING   Incomplete   BODY FLUID CULTURE     Status: Normal (Preliminary result)   Collection Time   11/26/12  2:17 PM      Component Value Range Status Comment   Specimen Description LUNG    Final    Special Requests Normal   Final    Gram Stain     Final    Value: WBC PRESENT,BOTH PMN AND MONONUCLEAR     NO ORGANISMS SEEN   Culture NO GROWTH   Final    Report Status PENDING   Incomplete   FUNGAL STAIN     Status: Normal   Collection Time   11/26/12  2:18 PM      Component Value Range Status Comment   Specimen Description FLUID PLEURAL   Final    Special Requests Normal   Final    Fungal Smear NO YEAST OR FUNGAL ELEMENTS SEEN   Final    Report Status 11/27/2012 FINAL   Final      Studies: Dg Chest 2 View  11/25/2012    IMPRESSION: Moderate to large right pleural effusion with right lower lobe atelectasis or infiltrate.   Original Report Authenticated By: Natasha Mead, M.D.    Ct Chest W Contrast  11/25/2012  .  IMPRESSION:  1.  A large right pleural effusion is noted.  There is some pleural thickening in the right lung posteriorly.  Highly suspicious for malignant pleural effusion.  Correlation with fluid analysis and cytology is recommended.  There is atelectasis of the right lower lobe right middle lobe and partial atelectasis of the right upper lobe. 2.  There is mediastinal and right hilar adenopathy. 3.  No definite enhancing lung mass is noted.  There is some central low density within the right lower lobe.  A occult mass cannot be excluded. Correlation with bronchoscopy or repeat CT scan after lung re-expansion is recommended. 4.  A right anterior diaphragmatic nodule or lymph node measures 1.1 cm. Metastatic adenopathy cannot be excluded.   Original Report Authenticated By: Natasha Mead, M.D.     Scheduled Meds:    . lamoTRIgine  150 mg Oral QHS  . mirtazapine  45 mg Oral QHS  . nicotine  21 mg Transdermal Daily  . piperacillin-tazobactam (ZOSYN)  IV  3.375 g Intravenous Q8H  . sodium chloride  3 mL Intravenous Q12H   Continuous Infusions:   Active Problems:  Pleural effusion, right  Tobacco abuse  Bipolar 1 disorder  Dyspnea    Time spent: 20  minutes    [name]  [facility] Pager [phone]. If 8PM-8AM, please contact night-coverage at [email or web address], password [facility] 11/27/2012, 6:03 PM  LOS: 2 days

## 2012-11-27 NOTE — Consult Note (Signed)
PULMONARY  / CRITICAL CARE MEDICINE  Name: Trig Mcbryar MRN: [id number] DOB: [birth year]    LOS: 2  REFERRING PROVIDER:  TRH  CHIEF COMPLAINT:  Chest Pain  BRIEF PATIENT DESCRIPTION: 51 year old male smoker with PMH of spontaneous PTX in 2010 treated with a  Chest tube that was attributed to bleb rupture, presenting with two week history of CP on the right.  In the ED a CXR was performed followed by a chest CT that revealed a large right sided pleural effusion with compressive atelectasis of the right lung.  PCCM was called for a thora and evaluation of pleural fluid.  Chest pain was pleuritic in nature with no exertional component and was positional.  Minimal SOB.  LINES / TUBES: PIV  CULTURES: Pleural Fluid 12/9>>> Blood 12/8>>>  ANTIBIOTICS: Zosyn 12/8>>>  SIGNIFICANT EVENTS:  12/8>>>Chest CT with a large right sided pleural effusion. 12/9>>>Thora with 1.6 liters out of straw colored fluid.  VITAL SIGNS: Temp:  [98.1 F (36.7 C)-98.4 F (36.9 C)] 98.4 F (36.9 C) (12/10 0531) Pulse Rate:  [90-103] 90  (12/10 1325) Resp:  [20] 20  (12/10 1325) BP: (99-107)/(61-76) 106/61 mmHg (12/10 1325) SpO2:  [91 %-94 %] 94 % (12/10 1325)  PHYSICAL EXAMINATION: General:  Thin tall, not chronically ill appearing. Neuro:  Alert, oriented and interactive. HEENT:  [place]/AT, PERRL, EOM-I and MMM. Neck:  Supple, -LAN and -thyromegally and -JVD. Cardiovascular:  RRR, Nl S1/S2, -M/R/G. Lungs:  No audible BS on the right, left lung with normal BS. Abdomen:  Soft, NT, ND and +BS. Musculoskeletal:  -edema and -tenderness. Skin:  Intact.  Lab 11/26/12 0500 11/25/12 1001  NA 138 134*  K 4.6 4.3  CL 101 95*  CO2 27 26  BUN 9 9  CREATININE 0.94 0.90  GLUCOSE 89 96    Lab 11/26/12 0500 11/25/12 1001  HGB 15.0 15.5  HCT 42.8 45.1  WBC 9.1 9.0  PLT 448* 496*   Dg Chest 2 View  11/27/2012  *RADIOLOGY REPORT*  Clinical Data: Short of breath  CHEST - 2 VIEW  Comparison: 11/26/2012   Findings: Left lung remains hyperaerated with chronic changes at the left lung apex.  Large right pleural effusion is stable.  No pneumothorax.  Cardiac silhouette obscured. Areas of volume loss in the right upper lung zone have improved.  IMPRESSION: Stable large right pleural effusion.  No pneumothorax. Improved volume loss in the aerated right upper lung zone.   Original Report Authenticated By: Jolaine Click, M.D.    Dg Chest Port 1 View  11/26/2012  *RADIOLOGY REPORT*  Clinical Data: Thoracentesis  PORTABLE CHEST - 1 VIEW  Comparison: Yesterday  Findings: No pneumothorax post right thoracentesis.  Right pleural effusion is improved but remains prominent. Left lung remains hyperaerated.  Shift of the mediastinum to the left has normalized and is now midline.  IMPRESSION: No pneumothorax post thoracentesis.   Original Report Authenticated By: Jolaine Click, M.D.     ASSESSMENT / PLAN:  51 year old male smoker with a large right sided pleural effusion with some evidence of loculation on the anterior upper part of the the right chest cavity.  There are some nodes on CT that are suspicious for malignancy specially with the history of smoking.  The patient has a large effusion and even with 1.6 liters out from the pleural effusion there is still quite a bit lef.  I have sent the pleural fluid for cytology but will likely need multiple thoras.   -  Abx choice I agree with even though I doubt that this is infectious but at least until ruled out.   - O2 as needed.  Pulmonary hygienes. - If cytology is negative in AM then will need to tap dry then perform a CT of the chest with contrast looking for a mass.  I am still having a difficult time believing that this fluid is anything but an oncologic process specially that we know now it is not infected and lung cancer is the most likely possibility.  Would like to know that the cytology there is negative first however prior to looking for a different source for  malignancy. - Send serum LDH and protein today.  Alyson Reedy, M.D. [facility] Pulmonary/Critical Care Medicine. Pager: [phone]. After hours pager: [phone].

## 2012-11-28 ENCOUNTER — Encounter (HOSPITAL_COMMUNITY): Payer: Self-pay | Admitting: Pharmacy Technician

## 2012-11-28 ENCOUNTER — Encounter (HOSPITAL_COMMUNITY): Payer: Self-pay | Admitting: *Deleted

## 2012-11-28 DIAGNOSIS — K5900 Constipation, unspecified: Secondary | ICD-10-CM

## 2012-11-28 DIAGNOSIS — J910 Malignant pleural effusion: Secondary | ICD-10-CM

## 2012-11-28 LAB — CBC
Hemoglobin: 13.9 g/dL (ref 13.0–17.0)
MCHC: 35 g/dL (ref 30.0–36.0)
WBC: 9.8 10*3/uL (ref 4.0–10.5)

## 2012-11-28 LAB — SURGICAL PCR SCREEN
MRSA, PCR: NEGATIVE
Staphylococcus aureus: NEGATIVE

## 2012-11-28 NOTE — Consult Note (Signed)
PULMONARY  / CRITICAL CARE MEDICINE  Name: Jay Watts MRN: [id number] DOB: 06-20-1961    LOS: 3  REFERRING PROVIDER:  TRH  CHIEF COMPLAINT:  Chest Pain  BRIEF PATIENT DESCRIPTION: 51 year old male smoker with PMH of spontaneous PTX in 2010 treated with a  Chest tube that was attributed to bleb rupture, presenting with two week history of CP on the right.  In the ED a CXR was performed followed by a chest CT that revealed a large right sided pleural effusion with compressive atelectasis of the right lung.  PCCM was called for a thora and evaluation of pleural fluid.  Chest pain was pleuritic in nature with no exertional component and was positional.  Minimal SOB.  LINES / TUBES: PIV  CULTURES: Pleural Fluid 12/9>>> Blood 12/8>>>  ANTIBIOTICS: Zosyn 12/8>>>  SIGNIFICANT EVENTS:  12/8>>>Chest CT with a large right sided pleural effusion. 12/9>>>Thora with 1.6 liters out of straw colored fluid.  VITAL SIGNS: Temp:  [97.8 F (36.6 C)-98.7 F (37.1 C)] 98.7 F (37.1 C) (12/11 0510) Pulse Rate:  [88-90] 88  (12/11 0510) Resp:  [18-20] 18  (12/11 0510) BP: (99-107)/(61-71) 107/71 mmHg (12/11 0510) SpO2:  [91 %-95 %] 95 % (12/11 0510)  PHYSICAL EXAMINATION: General:  Thin tall, not chronically ill appearing. Neuro:  Alert, oriented and interactive. HEENT:  [place]/AT, PERRL, EOM-I and MMM. Neck:  Supple, -LAN and -thyromegally and -JVD. Cardiovascular:  RRR, Nl S1/S2, -M/R/G. Lungs:  No audible BS on the right, left lung with normal BS. Abdomen:  Soft, NT, ND and +BS. Musculoskeletal:  -edema and -tenderness. Skin:  Intact.  Lab 11/26/12 0500 11/25/12 1001  NA 138 134*  K 4.6 4.3  CL 101 95*  CO2 27 26  BUN 9 9  CREATININE 0.94 0.90  GLUCOSE 89 96    Lab 11/26/12 0500 11/25/12 1001  HGB 15.0 15.5  HCT 42.8 45.1  WBC 9.1 9.0  PLT 448* 496*   Dg Chest 2 View  11/27/2012  *RADIOLOGY REPORT*  Clinical Data: Short of breath  CHEST - 2 VIEW  Comparison: 11/26/2012   Findings: Left lung remains hyperaerated with chronic changes at the left lung apex.  Large right pleural effusion is stable.  No pneumothorax.  Cardiac silhouette obscured. Areas of volume loss in the right upper lung zone have improved.  IMPRESSION: Stable large right pleural effusion.  No pneumothorax. Improved volume loss in the aerated right upper lung zone.   Original Report Authenticated By: Jolaine Click, M.D.    Dg Chest Port 1 View  11/26/2012  *RADIOLOGY REPORT*  Clinical Data: Thoracentesis  PORTABLE CHEST - 1 VIEW  Comparison: Yesterday  Findings: No pneumothorax post right thoracentesis.  Right pleural effusion is improved but remains prominent. Left lung remains hyperaerated.  Shift of the mediastinum to the left has normalized and is now midline.  IMPRESSION: No pneumothorax post thoracentesis.   Original Report Authenticated By: Jolaine Click, M.D.     ASSESSMENT / PLAN:  51 year old male smoker with a large right sided pleural effusion with some evidence of loculation on the anterior upper part of the the right chest cavity.  There are some nodes on CT that are suspicious for malignancy specially with the history of smoking.  The patient has a large effusion and even with 1.6 liters out from the pleural effusion there is still quite a bit lef.  I have sent the pleural fluid for cytology and it is ++. Needs CVTS consult for  pleurx catheter as he will need multiple thoras without chest tube and may need talc pleurodesis. - Abx choice I agree with even though I doubt that this is infectious but at least until ruled out.   - O2 as needed.  Pulmonary hygienes. - .  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 - Needs Oncology consult.  - Send serum LDH and protein 12-10 -12-11 PCCM will sign off.  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  Oncology and CVTS to be called.  Contacted primary.  PCCM will sign off, please call back if needed.  Patient seen and examined, agree with above note.  I dictated the care and orders written for this patient under my direction.  Koren Bound, M.D. [phone]

## 2012-11-28 NOTE — Consult Note (Signed)
[facility], [facility]  [phone]     ONCOLOGY  HOSPITAL CONSULTATION NOTE  Jay Watts                                MR#: [id number]  DOB: 10-May-1961                       CSN#: [id number]  Referring MD: Dr. Emmie Niemann [facility]   Reason for Consult: Lung Cancer   Jay Watts is a 51 y.o.  white male recently relocated from [place], [place], with a long history of tobacco abuse as well as a history of right pneumothorax in May of 2010 requiring chest tube insertion,  admitted on 12/8 with one week history of sternal chest pain radiating to the right,yellow productive cough and shortness of breath. He was increasingly fatigued during that time period.   CT of the  Chest with contrast on  12 / 09 /2013 revealed  a large right pleural effusion with some pleural thickening in the right lung posteriorly, highly suspicious for malignant pleural effusion.Mediastinal and right hilar adenopathy was seen.  No definite enhancing lung mass is noted but there is some central low density within the right lower lobe  A right anterior diaphragmatic nodule or lymph node measuring 1.1 cm was seen CT of the abdomen and pelvis with contrast and CT of the head are pending.   On 12/9 he underwent R thoracentesis yielding 1500 ml of clear pleural fluid. Pathology report case number [id number] , Dr. Dierdre Searles is consistent with adenocarcinoma of lung primary, with immunohistochemical stains strongly positive for CK7, TTF-1, negative for napsin A, CK20 and CDX-2. EGFR and ALK are pending. LDH is 174 in serum, and 377 in fluid. He may require a chest tube placement while in hospital due to residual fluid accumulation. CVTS is involved.   We were kindly requested to see this patient with recommendations.        PMH:  Past Medical History  Diagnosis Date  . Bipolar 2 disorder   Prior ETOH Tobacco habituation Hx MRSA ~ 5 years ago Surgeries: S/p chest tube insertion for spontaneous  pneumothorax         04/2009 in [place]. Nasal turbinate surgery 20 yrs ago Allergies: No Known Allergies  Medications:   . lamoTRIgine  150 mg Oral QHS  . mirtazapine  45 mg Oral QHS  . nicotine  21 mg Transdermal Daily  . piperacillin-tazobactam (ZOSYN)  IV  3.375 g Intravenous Q8H  . sodium chloride  3 mL Intravenous Q12H     LOV:FIEPPI chloride, acetaminophen, acetaminophen, albuterol, ipratropium, morphine injection, ondansetron (ZOFRAN) IV, ondansetron, oxyCODONE, polyethylene glycol, sodium chloride  ROS: Constitutional: Positive for  5 lb weight loss over the last 10 days without significant loss of appetite. . Negative for fever, chills or  night sweats.  Eyes: Negative for blurred vision and double vision. Gppd acuity with corrective lenses Respiratory: Negative for hemoptysis.No hoarseness.No neck swelling.  Positive for shortness of breath, especially  on exertion. No pleuritic chest pain. He does complain of sternal chest pain with radiation to the right, consistent with pleural fluid accumulation.  Cardiovascular: No cardiac  chest pain. No palpitations.  GI: Negative for  nausea, vomiting, diarrhea or constipation. No change in bowel caliber. No  Melena or hematochezia. No abdominal pain.  GU: Negative for hematuria. No loss of urinary control. No urinary retention. Skin:  Negative for itching. No rash. No petechia. No easy  Bruising. Musculoskeletal: Denies back , arm pain.  Neurological: No headaches.No confusion. No motor or sensory deficits.  Family History:    Mother alive,72 years old, with breast cancer. Father is 80 years old with HTN. One sister in good health. Maternal grandfather died at 35 with lung cancer. Two daughters ages 47 and 43 healthy   Social History: Single, 2 daughters. Originally from [place], moved to [place] and then to [place] 4 years ago. 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 He had been homeless for a brief period of time then. Decided  to move to [place] where he has work opportunities. He smoked 1ppd since age 37, trying to quit. No ETOH at this time, but had trouble with alcohol in the past, during the homeless years. NO recreational drug history. Daughters live in [place], oldest just completed Masters.   Physical Exam    Filed Vitals:   11/28/12 0510  BP: 107/71  Pulse: 88  Temp: 98.7 F (37.1 C)  Resp: 18     Filed Weights   11/25/12 1547  Weight: 170 lb (77.111 kg)   General:  51 year- old white male   in no acute distress A. and O. x3  well-developed, thin HEENT: Normocephalic, atraumatic, PERRLA. Sclerae anicteric. Oral cavity without thrush or lesions. NECK:supple. no thyromegaly, no cervical or supraclavicular adenopathy  LUNGS:absent breath sounds on the right lung, clear on the left.dullness to percussion on the right. No wheezing, rhonchi or rales. No axillary masses. BREASTS: not examined. CARDIOVASCULAR: regular rate and rhythm, no murmur , rubs or gallops ABDOMEN: soft nontender , bowel sounds x4. No HSM. No masses palpable.  GU/rectal: deferred. EXTREMITIES: no clubbing cyanosis or edema. No bruising or petechial rash MUSCULOSKELETAL: no spinal tenderness.  NEURO: Non Focal. No Horner's.   Labs:  CBC   Lab 11/26/12 0500 11/25/12 1001  WBC 9.1 9.0  HGB 15.0 15.5  HCT 42.8 45.1  PLT 448* 496*  MCV 92.4 92.4  MCH 32.4 31.8  MCHC 35.0 34.4  RDW 12.5 12.4  LYMPHSABS -- --  MONOABS -- --  EOSABS -- --  BASOSABS -- --  BANDABS -- --     CMP    Lab 11/26/12 0500 11/25/12 1001  NA 138 134*  K 4.6 4.3  CL 101 95*  CO2 27 26  GLUCOSE 89 96  BUN 9 9  CREATININE 0.94 0.90  CALCIUM 9.0 9.5  MG -- --  AST 13 15  ALT 9 12  ALKPHOS 82 98  BILITOT 0.3 0.4        Component Value Date/Time   BILITOT 0.3 11/26/2012 0500      Lab 11/25/12 1600 11/25/12 1001  INR 0.96 0.93  PROTIME -- --     Imaging Studies:   Ct Chest W Contrast  11/25/2012   Comparison: Chest x-ray  same day.  Findings: No destructive bony lesions are noted.  Central airways are patent.  Bilateral apical large paraseptal emphysematous bullae are noted.  There is a large right pleural effusion.  There is significant atelectasis in the right middle lobe partially right upper lobe and right lower lobe.  There is some pleural thickening in the right midlung posteriorly best seen in axial image 44.  Malignant pleural effusion cannot be excluded.  Correlation with fluid analysis and cytology is recommended.  No definite enhancing lung mass is noted.  There is some low density in the right lower  lobe centrally measures about 2 cm.  Called to mass cannot be excluded.  Further evaluation with bronchoscopy or repeat CT examination after lung re-expansion is recommended to exclude a subtle mass.  A subcarinal lymph node measures 2.4 x 1.7 cm.  There is a right hilar lymph node measures 2.2 x 1.8 cm.  Pretracheal lymph node measures 2.3 x 1.9 cm.  There is a right anterior mediastinal lymph node measures 1.9 x 1.5 cm. A right paratracheal lymph node measures 1.6 x 1.4 cm.  Small pericardial effusion.  There is a right anterior diaphragmatic nodule or lymph node best seen in axial image 56 measures 1.1 cm. Pathologic adenopathy or metastatic disease cannot be excluded.  No central pulmonary embolus is noted.  Left lung is clear. Minimal atelectasis noted in the left lower lobe.  IMPRESSION:  1.  A large right pleural effusion is noted.  There is some pleural thickening in the right lung posteriorly.  Highly suspicious for malignant pleural effusion.  Correlation with fluid analysis and cytology is recommended.  There is atelectasis of the right lower lobe right middle lobe and partial atelectasis of the right upper lobe. 2.  There is mediastinal and right hilar adenopathy. 3.  No definite enhancing lung mass is noted.  There is some central low density within the right lower lobe.  A occult mass cannot be excluded.  Correlation with bronchoscopy or repeat CT scan after lung re-expansion is recommended. 4.  A right anterior diaphragmatic nodule or lymph node measures 1.1 cm. Metastatic adenopathy cannot be excluded.   Original Report Authenticated By: Natasha Mead, M.D.    Dg Chest Port 1 View  11/26/2012  *RADIOLOGY REPORT*  Clinical Data: Thoracentesis  PORTABLE CHEST - 1 VIEW  Comparison: Yesterday  Findings: No pneumothorax post right thoracentesis.  Right pleural effusion is improved but remains prominent. Left lung remains hyperaerated.  Shift of the mediastinum to the left has normalized and is now midline.  IMPRESSION: No pneumothorax post thoracentesis.   Original Report Authenticated By: Jolaine Click, M.D.       A/P: 51 y.o. male admitted on 12/8 with R chest pain and shortness of breath, found to have a large R pleural effusion, s/p R thoracentesis, with pathology consistent with adenocarcinoma of lung primary. Due to continued presence of pleural fluid, he may undergo chest tube placement during this hospitalization. CT of the abdomen and pelvis as well as CT of the head are currently pending to evaluate metastatic disease.  We were kindly requested to evaluate the patient.  Dr.   Darrold Span  is to see the patient following this consult with recommendations regarding diagnosis, treatment options and further workup studies. An addendum to this note is to be written.  Thank you for the referral.  [facility] 11/28/2012 12:32 PM  Agree with above note as fully reviewed and edited now. See my separate addendum from 11-28-12 also L.Darrold Span, MD [phone]

## 2012-11-28 NOTE — Progress Notes (Signed)
Medical Oncology  Information reviewed in EMR, patient seen and examined, with all history reviewed. See also full consult note by S.Wertman this date.  Briefly, 51 yo with 35+ pack year smoking just DCd (tho he has stopped for up to a year at a time previously), admitted with ~ 2 weeks SOB with exertion such as walking to mailbox and some right chest discomfort, admitted 11-25-12. Workup to date includes CT chest with large right pleural effusion and mediastinal and right hilar adenopathy. He had thoracentesis for 1500cc clear fluid by Dr Molli Knock on 11-26-12, path reportedly showing adenocarcinoma tho that information is not yet finalized in EMR. He still has significant pleural fluid on right, with chest tube or VATS (?) being considered. He has some minimal weight loss, no fever, cough improved since thoracentesis, no other pain, no new or different neurologic symptoms, LFTs ok, appetite better today. He needs completion of staging, with CT AP, head and bone scan all ordered now. Will follow up pathology when final.   Note he refuses flu vaccine, tho I have tried to encourage this.  Thank you for the consult and our service will be involved as appropriate.  L.Caroline Matters, MD [phone]

## 2012-11-28 NOTE — Consult Note (Signed)
[address]            [place] [address]          [phone]      Reason for Consult: Malignant right pleural effusion  Referring Physician:  Dr. Lucie Leather is an 51 y.o. male.  HPI:   He has a long smoking hx and still smokes 1 ppd. He presented with 2 week hx of progressive shortness of breath and right chest pain, some cough. CT shows large right pleural effusion with significant mediastinal and right hilar adenopathy. Right lung is compressed. He had right thoracentesis with 1.5 liters of fluid removed, positive for adenocarcinoma. Follow-up cxr shows persistent large right pleural effusion.  Past Medical History  Diagnosis Date  . Bipolar 2 disorder   . Shortness of breath     with exertion  . Pneumothorax     right side- spring 2013    Past Surgical History  Procedure Date  . Turbinate reduction     History reviewed. No pertinent family history.  Social History:  reports that he has been smoking Cigarettes.  He has a 35 pack-year smoking history. He does not have any smokeless tobacco history on file. He reports that he does not drink alcohol or use illicit drugs.  Allergies: No Known Allergies  Medications:  I have reviewed the patient's current medications. Prior to Admission:  Prescriptions prior to admission  Medication Sig Dispense Refill  . lamoTRIgine (LAMICTAL) 150 MG tablet Take 150 mg by mouth at bedtime.      . mirtazapine (REMERON) 45 MG tablet Take 45 mg by mouth at bedtime.      . naproxen sodium (ANAPROX) 220 MG tablet Take 220 mg by mouth 2 (two) times daily with a meal.       Scheduled:   . lamoTRIgine  150 mg Oral QHS  . mirtazapine  45 mg Oral QHS  . nicotine  21 mg Transdermal Daily  . piperacillin-tazobactam (ZOSYN)  IV  3.375 g Intravenous Q8H  . sodium chloride  3 mL Intravenous Q12H   Continuous:  BJY:NWGNFA chloride, acetaminophen, acetaminophen, albuterol, ipratropium, morphine  injection, ondansetron (ZOFRAN) IV, ondansetron, oxyCODONE, polyethylene glycol, sodium chloride  Results for orders placed during the hospital encounter of 11/25/12 (from the past 48 hour(s))  LACTATE DEHYDROGENASE     Status: Normal   Collection Time   11/27/12  4:35 PM      Component Value Range Comment   LDH 174  94 - 250 U/L   PROTEIN, TOTAL     Status: Normal   Collection Time   11/27/12  4:35 PM      Component Value Range Comment   Total Protein 6.6  6.0 - 8.3 g/dL   CBC     Status: Abnormal   Collection Time   11/28/12  2:25 PM      Component Value Range Comment   WBC 9.8  4.0 - 10.5 K/uL    RBC 4.27  4.22 - 5.81 MIL/uL    Hemoglobin 13.9  13.0 - 17.0 g/dL    HCT 21.3  08.6 - 57.8 %    MCV 93.0  78.0 - 100.0 fL    MCH 32.6  26.0 - 34.0 pg    MCHC 35.0  30.0 - 36.0 g/dL    RDW 46.9  62.9 - 52.8 %  Platelets 433 (*) 150 - 400 K/uL   SURGICAL PCR SCREEN     Status: Normal   Collection Time   11/28/12  4:58 PM      Component Value Range Comment   MRSA, PCR NEGATIVE  NEGATIVE    Staphylococcus aureus NEGATIVE  NEGATIVE     Dg Chest 2 View  11/27/2012  *RADIOLOGY REPORT*  Clinical Data: Short of breath  CHEST - 2 VIEW  Comparison: 11/26/2012  Findings: Left lung remains hyperaerated with chronic changes at the left lung apex.  Large right pleural effusion is stable.  No pneumothorax.  Cardiac silhouette obscured. Areas of volume loss in the right upper lung zone have improved.  IMPRESSION: Stable large right pleural effusion.  No pneumothorax. Improved volume loss in the aerated right upper lung zone.   Original Report Authenticated By: Jolaine Click, M.D.     Review of Systems  Constitutional: Positive for malaise/fatigue. Negative for fever, chills and weight loss.  HENT: Negative.   Eyes: Negative.   Respiratory: Positive for cough and shortness of breath. Negative for hemoptysis and sputum production.   Cardiovascular: Positive for chest pain and orthopnea. Negative  for leg swelling and PND.       Substernal to around right side.  Gastrointestinal: Negative.   Genitourinary: Negative.   Musculoskeletal: Negative.   Skin: Negative.   Neurological: Negative.  Negative for weakness.  Endo/Heme/Allergies: Negative.   Psychiatric/Behavioral:       Hx of bipolar disorder.   Blood pressure 107/71, pulse 88, temperature 98.7 F (37.1 C), temperature source Oral, resp. rate 18, height 6\' 3"  (1.905 m), weight 77.111 kg (170 lb), SpO2 95.00%. Physical Exam  Constitutional: He is oriented to person, place, and time. He appears well-developed and well-nourished. No distress.  HENT:  Head: Normocephalic and atraumatic.  Mouth/Throat: Oropharynx is clear and moist. No oropharyngeal exudate.  Eyes: Conjunctivae normal and EOM are normal. Pupils are equal, round, and reactive to light.  Neck: Neck supple. No JVD present. No tracheal deviation present. No thyromegaly present.  Cardiovascular: Normal rate, normal heart sounds and intact distal pulses.  Exam reveals no gallop and no friction rub.   No murmur heard. Respiratory: Effort normal. He has wheezes. He exhibits no tenderness.       Decreased breath sounds over right lower lobe  GI: Soft. Bowel sounds are normal. He exhibits no distension and no mass. There is no tenderness.  Musculoskeletal: He exhibits no edema.  Lymphadenopathy:    He has no cervical adenopathy.  Neurological: He is alert and oriented to person, place, and time. He has normal strength. No cranial nerve deficit or sensory deficit.  Skin: Skin is warm and dry.  Psychiatric: He has a normal mood and affect.   *RADIOLOGY REPORT*   Clinical Data: Chest pain, shortness of breath   CT CHEST WITH CONTRAST   Technique:  Multidetector CT imaging of the chest was performed following the standard protocol during bolus administration of intravenous contrast.   Contrast: OMNIPAQUE IOHEXOL 300 MG/ML  SOLN   Comparison: Chest x-ray same  day.   Findings: No destructive bony lesions are noted.  Central airways are patent.  Bilateral apical large paraseptal emphysematous bullae are noted.  There is a large right pleural effusion.  There is significant atelectasis in the right middle lobe partially right upper lobe and right lower lobe.   There is some pleural thickening in the right midlung posteriorly best seen in axial image 44.  Malignant pleural effusion cannot be excluded.  Correlation with fluid analysis and cytology is recommended.  No definite enhancing lung mass is noted.  There is some low density in the right lower lobe centrally measures about 2 cm.  Called to mass cannot be excluded.  Further evaluation with bronchoscopy or repeat CT examination after lung re-expansion is recommended to exclude a subtle mass.  A subcarinal lymph node measures 2.4 x 1.7 cm.  There is a right hilar lymph node measures 2.2 x 1.8 cm.  Pretracheal lymph node measures 2.3 x 1.9 cm.  There is a right anterior mediastinal lymph node measures 1.9 x 1.5 cm. A right paratracheal lymph node measures 1.6 x 1.4 cm.  Small pericardial effusion.  There is a right anterior diaphragmatic nodule or lymph node best seen in axial image 56 measures 1.1 cm. Pathologic adenopathy or metastatic disease cannot be excluded.   No central pulmonary embolus is noted.  Left lung is clear. Minimal atelectasis noted in the left lower lobe.   IMPRESSION:   1.  A large right pleural effusion is noted.  There is some pleural thickening in the right lung posteriorly.  Highly suspicious for malignant pleural effusion.  Correlation with fluid analysis and cytology is recommended.  There is atelectasis of the right lower lobe right middle lobe and partial atelectasis of the right upper lobe. 2.  There is mediastinal and right hilar adenopathy. 3.  No definite enhancing lung mass is noted.  There is some central low density within the right lower lobe.  A  occult mass cannot be excluded. Correlation with bronchoscopy or repeat CT scan after lung re-expansion is recommended. 4.  A right anterior diaphragmatic nodule or lymph node measures 1.1 cm. Metastatic adenopathy cannot be excluded.     Original Report Authenticated By: Natasha Mead, M.D.  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 He needs a pleurx catheter to completely drain this and manage it as an outpatient. I discussed the procedure, alternatives, benefits and risks with him. He understands and agrees to proceed. He will be transferred to [facility] in the am for the procedure since we do not have operative time here.  [name] 11/28/2012, 7:29 PM

## 2012-11-28 NOTE — Progress Notes (Signed)
[facility] PROGRESS NOTE  Jay Watts ZOX:[id number] DOB: [birth year] DOA: 11/25/2012 PCP: No primary provider on file.  Interim summary: 51 year old white male with past medical history tobacco abuse and bipolar disorder who presented with several days of worsening shortness of breath. His son have a large right pleural effusion concerning for malignancy. Patient was started on antibiotics and taste that this was infectious. Pulmonary was consulted who performed a thoracentesis on 12/9. Fluid was sent for cytology is preliminarily positive for adenocarcinoma. 1.5 L were removed the patient had some improvement. Repeat x-rays note still persistence of significant amount of pleural effusion.  Called oncology and CT surgery this morning.      Assessment/Plan: Active Problems:   Pleural effusion, right: Status post thoracentesis done 12/9 preliminarily positive for adenocarcinoma.  -  Medical oncology consultation -  CT surgery consultation -  Appreciate pulmonlogy recommendations   Tobacco abuse: Counseled   Bipolar 1 disorder: Stable.     Dyspnea: Secondary to effusion and improved with initial thora.   -  CT surgery to consider pleurex/VATS  Constipation: Prn miralax.  Code Status: Full Family Communication: Results and plan discussed with patient at the bedside. Disposition Plan:  home hopefully in the next few days   Consultants:  *Dr.Yacoub-Pulmonary Medicine  Procedures:  Status post thoracentesis of right lung on 12/9 yielding 1.5 L of fluid  Antibiotics:  IV Zosyn day 4  HPI/Subjective: Patient breathing better after thoracentesis and feels less pressure on that side, but still has some pressure.  Denies nausea, eating well.  Denies diarrhea and constipation.  Objective: Filed Vitals:   11/27/12 0531 11/27/12 1325 11/27/12 2128 11/28/12 0510  BP: 99/69 106/61 99/65 107/71  Pulse: 93 90 89 88  Temp: 98.4 F (36.9 C)  97.8 F (36.6 C) 98.7 F (37.1  C)  TempSrc: Oral Oral Oral Oral  Resp: 20 20 18 18   Height:      Weight:      SpO2: 91% 94% 91% 95%    Intake/Output Summary (Last 24 hours) at 11/28/12 1156 Last data filed at 11/28/12 0842  Gross per 24 hour  Intake   1660 ml  Output   1550 ml  Net    110 ml   Filed Weights   11/25/12 1547  Weight: 77.111 kg (170 lb)    Exam:   General:  Tall thin, marfanoid CM, no acute distress  Cardiovascular: Regular rate and rhythm, S1-S2  Respiratory: Decreased breath sounds on right side to just below the apex and dull to percussion below  Abdomen:  Soft, nontender, nondistended, positive bowel sounds  Extremities: No clubbing or cyanosis or edema  Data Reviewed: Basic Metabolic Panel:  Lab 11/26/12 8119 11/25/12 1001  NA 138 134*  K 4.6 4.3  CL 101 95*  CO2 27 26  GLUCOSE 89 96  BUN 9 9  CREATININE 0.94 0.90  CALCIUM 9.0 9.5  MG -- --  PHOS -- --   Liver Function Tests:  Lab 11/27/12 1635 11/26/12 0500 11/25/12 1001  AST -- 13 15  ALT -- 9 12  ALKPHOS -- 82 98  BILITOT -- 0.3 0.4  PROT 6.6 6.0 7.2  ALBUMIN -- 2.8* 3.5   CBC:  Lab 11/26/12 0500 11/25/12 1001  WBC 9.1 9.0  NEUTROABS -- --  HGB 15.0 15.5  HCT 42.8 45.1  MCV 92.4 92.4  PLT 448* 496*     Recent Results (from the past 240 hour(s))  MRSA PCR SCREENING  Status: Normal   Collection Time   11/25/12  6:31 PM      Component Value Range Status Comment   MRSA by PCR NEGATIVE  NEGATIVE Final   AFB CULTURE WITH SMEAR     Status: Normal (Preliminary result)   Collection Time   11/26/12  2:17 PM      Component Value Range Status Comment   Specimen Description LUNG   Final    Special Requests Normal   Final    ACID FAST SMEAR NO ACID FAST BACILLI SEEN   Final    Culture     Final    Value: CULTURE WILL BE EXAMINED FOR 6 WEEKS BEFORE ISSUING A FINAL REPORT   Report Status PENDING   Incomplete   BODY FLUID CULTURE     Status: Normal (Preliminary result)   Collection Time   11/26/12  2:17  PM      Component Value Range Status Comment   Specimen Description LUNG   Final    Special Requests Normal   Final    Gram Stain     Final    Value: WBC PRESENT,BOTH PMN AND MONONUCLEAR     NO ORGANISMS SEEN   Culture NO GROWTH 1 DAY   Final    Report Status PENDING   Incomplete   FUNGAL STAIN     Status: Normal   Collection Time   11/26/12  2:18 PM      Component Value Range Status Comment   Specimen Description FLUID PLEURAL   Final    Special Requests Normal   Final    Fungal Smear NO YEAST OR FUNGAL ELEMENTS SEEN   Final    Report Status 11/27/2012 FINAL   Final      Studies: Dg Chest 2 View  11/25/2012    IMPRESSION: Moderate to large right pleural effusion with right lower lobe atelectasis or infiltrate.   Original Report Authenticated By: Natasha Mead, M.D.    Ct Chest W Contrast  11/25/2012  .  IMPRESSION:  1.  A large right pleural effusion is noted.  There is some pleural thickening in the right lung posteriorly.  Highly suspicious for malignant pleural effusion.  Correlation with fluid analysis and cytology is recommended.  There is atelectasis of the right lower lobe right middle lobe and partial atelectasis of the right upper lobe. 2.  There is mediastinal and right hilar adenopathy. 3.  No definite enhancing lung mass is noted.  There is some central low density within the right lower lobe.  A occult mass cannot be excluded. Correlation with bronchoscopy or repeat CT scan after lung re-expansion is recommended. 4.  A right anterior diaphragmatic nodule or lymph node measures 1.1 cm. Metastatic adenopathy cannot be excluded.   Original Report Authenticated By: Natasha Mead, M.D.     Scheduled Meds:    . lamoTRIgine  150 mg Oral QHS  . mirtazapine  45 mg Oral QHS  . nicotine  21 mg Transdermal Daily  . piperacillin-tazobactam (ZOSYN)  IV  3.375 g Intravenous Q8H  . sodium chloride  3 mL Intravenous Q12H   Continuous Infusions:   Active Problems:  Pleural effusion, right   Tobacco abuse  Bipolar 1 disorder  Dyspnea  Constipation    Time spent: 20 minutes    Jay Watts, [facility]  [facility] Pager [phone]. If 8PM-8AM, please contact night-coverage at [email or web address], password [facility] 11/28/2012, 11:56 AM  LOS: 3 days

## 2012-11-28 NOTE — Progress Notes (Signed)
I spoke with Jay Watts, ts nurse at [facility] ,pt is not on telemetry at this time.  I spoke with patient and did STOP BANG Tool.  Pt to arrive at [facility] at 0530.

## 2012-11-28 NOTE — Progress Notes (Signed)
ANTIBIOTIC CONSULT NOTE - F/U Consult  Pharmacy Consult for Zosyn Indication: pneumonia  No Known Allergies  Patient Measurements: Height: 6\' 3"  (190.5 cm) (as reported to RN) Weight: 170 lb (77.111 kg) (as reported to RN) IBW/kg (Calculated) : 84.5    Vital Signs: Temp: 98.7 F (37.1 C) (12/11 0510) Temp src: Oral (12/11 0510) BP: 107/71 mmHg (12/11 0510) Pulse Rate: 88  (12/11 0510) Intake/Output from previous day: 12/10 0701 - 12/11 0700 In: 1470 [P.O.:1320; IV Piggyback:150] Out: 1250 [Urine:1250]  Labs:  Basename 11/26/12 0500  WBC 9.1  HGB 15.0  PLT 448*  LABCREA --  CREATININE 0.94   Estimated Creatinine Clearance: 101.4 ml/min (by C-G formula based on Cr of 0.94).   Microbiology: Recent Results (from the past 720 hour(s))  MRSA PCR SCREENING     Status: Normal   Collection Time   11/25/12  6:31 PM      Component Value Range Status Comment   MRSA by PCR NEGATIVE  NEGATIVE Final   AFB CULTURE WITH SMEAR     Status: Normal (Preliminary result)   Collection Time   11/26/12  2:17 PM      Component Value Range Status Comment   Specimen Description LUNG   Final    Special Requests Normal   Final    ACID FAST SMEAR NO ACID FAST BACILLI SEEN   Final    Culture     Final    Value: CULTURE WILL BE EXAMINED FOR 6 WEEKS BEFORE ISSUING A FINAL REPORT   Report Status PENDING   Incomplete   BODY FLUID CULTURE     Status: Normal (Preliminary result)   Collection Time   11/26/12  2:17 PM      Component Value Range Status Comment   Specimen Description LUNG   Final    Special Requests Normal   Final    Gram Stain     Final    Value: WBC PRESENT,BOTH PMN AND MONONUCLEAR     NO ORGANISMS SEEN   Culture NO GROWTH 1 DAY   Final    Report Status PENDING   Incomplete   FUNGAL STAIN     Status: Normal   Collection Time   11/26/12  2:18 PM      Component Value Range Status Comment   Specimen Description FLUID PLEURAL   Final    Special Requests Normal   Final    Fungal  Smear NO YEAST OR FUNGAL ELEMENTS SEEN   Final    Report Status 11/27/2012 FINAL   Final    Medications:  Anti-infectives     Start     Dose/Rate Route Frequency Ordered Stop   11/26/12 0300  piperacillin-tazobactam (ZOSYN) IVPB 3.375 g       3.375 g 12.5 mL/hr over 240 Minutes Intravenous Every 8 hours 11/26/12 0001     11/25/12 1600   piperacillin-tazobactam (ZOSYN) IVPB 3.375 g  Status:  Discontinued        3.375 g 12.5 mL/hr over 240 Minutes Intravenous Every 8 hours 11/25/12 1552 11/26/12 0001           Assessment:  51 yo male presenting with intermittent right sided chest pain and productive cough x 1 week and progressive shortness of breath in the last week.  S/p thoracentesis 12/9, follow up pathology results  Day #4 Zosyn empirically for pneumonia  Plan:   Continue Zosyn 3.375g IV Q8H infused over 4hrs.  Follow up renal function and cultures.  Lynann Beaver  PharmD, BCPS Pager [phone] 11/28/2012 2:47 PM

## 2012-11-28 NOTE — Progress Notes (Signed)
Pt stated that he had an EKG at [facility] in [place], [place] this year.  I faxed a request to that facility for a copy of the EKG and last office note.

## 2012-11-29 ENCOUNTER — Inpatient Hospital Stay (HOSPITAL_COMMUNITY): Payer: Medicaid Other | Admitting: Anesthesiology

## 2012-11-29 ENCOUNTER — Encounter (HOSPITAL_COMMUNITY): Payer: Self-pay | Admitting: Anesthesiology

## 2012-11-29 ENCOUNTER — Ambulatory Visit (HOSPITAL_COMMUNITY): Payer: Medicaid Other

## 2012-11-29 ENCOUNTER — Inpatient Hospital Stay (HOSPITAL_COMMUNITY): Payer: Medicaid Other

## 2012-11-29 ENCOUNTER — Encounter (HOSPITAL_COMMUNITY)
Admission: EM | Disposition: A | Discharge status: Home Health | Payer: Self-pay | Source: Ambulatory Visit | Attending: Internal Medicine

## 2012-11-29 ENCOUNTER — Encounter (HOSPITAL_COMMUNITY): Payer: Self-pay | Admitting: *Deleted

## 2012-11-29 ENCOUNTER — Ambulatory Visit (HOSPITAL_COMMUNITY): Admission: RE | Admit: 2012-11-29 | Payer: MEDICAID | Source: Ambulatory Visit | Admitting: Surgery

## 2012-11-29 ENCOUNTER — Ambulatory Visit (HOSPITAL_COMMUNITY): Payer: MEDICAID

## 2012-11-29 DIAGNOSIS — J90 Pleural effusion, not elsewhere classified: Secondary | ICD-10-CM

## 2012-11-29 HISTORY — PX: CHEST TUBE INSERTION: SHX231

## 2012-11-29 HISTORY — DX: Shortness of breath: R06.02

## 2012-11-29 HISTORY — DX: Pneumothorax, unspecified: J93.9

## 2012-11-29 SURGERY — INSERTION, PLEURAL DRAINAGE CATHETER
Anesthesia: Monitor Anesthesia Care | Site: Chest | Laterality: Right | Wound class: Clean Contaminated

## 2012-11-29 MED ORDER — LACTATED RINGERS IV SOLN
INTRAVENOUS | Status: DC | PRN
  Administered 2012-11-29: 16:00:00 via INTRAVENOUS

## 2012-11-29 MED ORDER — PHENYLEPHRINE HCL 10 MG/ML IJ SOLN
INTRAMUSCULAR | Status: DC | PRN
  Administered 2012-11-29: 20 ug via INTRAVENOUS
  Administered 2012-11-29: 40 ug via INTRAVENOUS

## 2012-11-29 MED ORDER — OXYCODONE HCL 5 MG/5ML PO SOLN: 5.0000 mg | Freq: Once | ORAL | Status: DC | PRN

## 2012-11-29 MED ORDER — CEFAZOLIN SODIUM-DEXTROSE 2-3 GM-% IV SOLR
2.0000 g | INTRAVENOUS | Status: DC | PRN
Start: 2012-11-29 — End: 2012-11-29

## 2012-11-29 MED ORDER — HYDROMORPHONE HCL PF 1 MG/ML IJ SOLN
INTRAMUSCULAR | Status: AC
  Filled 2012-11-29: qty 1

## 2012-11-29 MED ORDER — LIDOCAINE 1 % OPTIME INJ - NO CHARGE
INTRAMUSCULAR | Status: DC | PRN
  Administered 2012-11-29: 7 mL

## 2012-11-29 MED ORDER — MIDAZOLAM HCL 5 MG/5ML IJ SOLN
INTRAMUSCULAR | Status: DC | PRN
  Administered 2012-11-29: 2 mg via INTRAVENOUS

## 2012-11-29 MED ORDER — CEFAZOLIN SODIUM 1-5 GM-% IV SOLN
INTRAVENOUS | Status: DC | PRN
  Administered 2012-11-29: 2 g via INTRAVENOUS

## 2012-11-29 MED ORDER — CEFAZOLIN SODIUM-DEXTROSE 2-3 GM-% IV SOLR
INTRAVENOUS | Status: AC
  Filled 2012-11-29: qty 50

## 2012-11-29 MED ORDER — PROPOFOL INFUSION 10 MG/ML OPTIME
INTRAVENOUS | Status: DC | PRN
  Administered 2012-11-29: 25 ug/kg/min via INTRAVENOUS

## 2012-11-29 MED ORDER — OXYCODONE HCL 5 MG PO TABS: 5.0000 mg | ORAL_TABLET | Freq: Once | ORAL | Status: DC | PRN

## 2012-11-29 MED ORDER — FENTANYL CITRATE 0.05 MG/ML IJ SOLN
INTRAMUSCULAR | Status: DC | PRN
  Administered 2012-11-29 (×2): 50 ug via INTRAVENOUS

## 2012-11-29 MED ORDER — ONDANSETRON HCL 4 MG/2ML IJ SOLN
INTRAMUSCULAR | Status: DC | PRN
  Administered 2012-11-29: 4 mg via INTRAVENOUS

## 2012-11-29 MED ORDER — MEPERIDINE HCL 25 MG/ML IJ SOLN: 6.2500 mg | INTRAMUSCULAR | Status: DC | PRN

## 2012-11-29 MED ORDER — HYDROMORPHONE HCL PF 1 MG/ML IJ SOLN
0.2500 mg | INTRAMUSCULAR | Status: DC | PRN
  Administered 2012-11-29 (×2): 0.5 mg via INTRAVENOUS

## 2012-11-29 MED ORDER — PROPOFOL 10 MG/ML IV BOLUS
INTRAVENOUS | Status: DC | PRN
  Administered 2012-11-29 (×2): 30 mg via INTRAVENOUS

## 2012-11-29 MED ORDER — PROMETHAZINE HCL 25 MG/ML IJ SOLN: 6.2500 mg | INTRAMUSCULAR | Status: DC | PRN

## 2012-11-29 SURGICAL SUPPLY — 33 items
ADH SKN CLS APL DERMABOND .7 (GAUZE/BANDAGES/DRESSINGS) ×1
BRUSH SCRUB EZ PLAIN DRY (MISCELLANEOUS) ×4 IMPLANT
CANISTER SUCTION 2500CC (MISCELLANEOUS) ×2 IMPLANT
CLOTH BEACON ORANGE TIMEOUT ST (SAFETY) ×2 IMPLANT
COVER SURGICAL LIGHT HANDLE (MISCELLANEOUS) ×2 IMPLANT
DERMABOND ADVANCED (GAUZE/BANDAGES/DRESSINGS) ×1
DERMABOND ADVANCED .7 DNX12 (GAUZE/BANDAGES/DRESSINGS) ×1 IMPLANT
DRAPE C-ARM 42X72 X-RAY (DRAPES) ×2 IMPLANT
DRAPE LAPAROSCOPIC ABDOMINAL (DRAPES) ×2 IMPLANT
GLOVE BIO SURGEON STRL SZ 6 (GLOVE) ×2 IMPLANT
GLOVE BIOGEL PI IND STRL 6.5 (GLOVE) ×1 IMPLANT
GLOVE BIOGEL PI INDICATOR 6.5 (GLOVE) ×1
GLOVE EUDERMIC 7 POWDERFREE (GLOVE) ×2 IMPLANT
GOWN PREVENTION PLUS XLARGE (GOWN DISPOSABLE) ×2 IMPLANT
GOWN STRL NON-REIN LRG LVL3 (GOWN DISPOSABLE) ×4 IMPLANT
KIT BASIN OR (CUSTOM PROCEDURE TRAY) ×2 IMPLANT
KIT PLEURX DRAIN CATH 1000ML (MISCELLANEOUS) ×4 IMPLANT
KIT PLEURX DRAIN CATH 15.5FR (DRAIN) ×2 IMPLANT
KIT ROOM TURNOVER OR (KITS) ×2 IMPLANT
NS IRRIG 1000ML POUR BTL (IV SOLUTION) ×2 IMPLANT
PACK GENERAL/GYN (CUSTOM PROCEDURE TRAY) ×2 IMPLANT
PAD ARMBOARD 7.5X6 YLW CONV (MISCELLANEOUS) ×4 IMPLANT
SCRUB BETADINE 4OZ XXX (MISCELLANEOUS) ×2 IMPLANT
SET DRAINAGE LINE (MISCELLANEOUS) IMPLANT
SOLUTION BETADINE 4OZ (MISCELLANEOUS) ×2 IMPLANT
SPONGE GAUZE 4X4 12PLY (GAUZE/BANDAGES/DRESSINGS) ×2 IMPLANT
STOPCOCK 4 WAY LG BORE MALE ST (IV SETS) ×2 IMPLANT
SUT ETHILON 3 0 PS 1 (SUTURE) ×2 IMPLANT
SUT VIC AB 3-0 X1 27 (SUTURE) ×2 IMPLANT
SYR 20CC LL (SYRINGE) ×2 IMPLANT
TOWEL OR 17X24 6PK STRL BLUE (TOWEL DISPOSABLE) ×2 IMPLANT
TOWEL OR 17X26 10 PK STRL BLUE (TOWEL DISPOSABLE) ×2 IMPLANT
WATER STERILE IRR 1000ML POUR (IV SOLUTION) ×2 IMPLANT

## 2012-11-29 NOTE — Anesthesia Postprocedure Evaluation (Signed)
Anesthesia Post Note  Patient: Jay Watts  Procedure(s) Performed: Procedure(s) (LRB): INSERTION PLEURAL DRAINAGE CATHETER (Right)  Anesthesia type: MAC  Patient location: PACU  Post pain: Pain level controlled and Adequate analgesia  Post assessment: Post-op Vital signs reviewed, Patient's Cardiovascular Status Stable and Respiratory Function Stable  Last Vitals:  Filed Vitals:   11/29/12 1712  BP:   Pulse:   Temp: 36.1 C  Resp:     Post vital signs: Reviewed and stable  Level of consciousness: awake, alert  and oriented  Complications: No apparent anesthesia complications

## 2012-11-29 NOTE — Preoperative (Signed)
Beta Blockers   Reason not to administer Beta Blockers:Not Applicable 

## 2012-11-29 NOTE — Brief Op Note (Signed)
11/25/2012 - 11/29/2012  5:08 PM  PATIENT:  Jay Watts  51 y.o. male  PRE-OPERATIVE DIAGNOSIS:  Malignant right pleural effusion  POST-OPERATIVE DIAGNOSIS:  Same  PROCEDURE:  Procedure(s) (LRB) with comments: INSERTION PLEURAL DRAINAGE CATHETER (Right)  SURGEON:  Surgeon(s) and Role:    * [name], MD - Primary  PHYSICIAN ASSISTANT: none  ASSISTANTS: none   ANESTHESIA:   local and MAC  EBL:     BLOOD ADMINISTERED:none  DRAINS: none   LOCAL MEDICATIONS USED:  LIDOCAINE   SPECIMEN:  No Specimen  DISPOSITION OF SPECIMEN:  N/A  COUNTS:  YES  PATIENT DISPOSITION:  PACU - hemodynamically stable.   Delay start of Pharmacological VTE agent (>24hrs) due to surgical blood loss or risk of bleeding: not applicable

## 2012-11-29 NOTE — Anesthesia Preprocedure Evaluation (Addendum)
Anesthesia Evaluation  Patient identified by MRN, date of birth, ID band Patient awake    Reviewed: Allergy & Precautions, H&P , NPO status , Patient's Chart, lab work & pertinent test results  History of Anesthesia Complications Negative for: history of anesthetic complications  Airway Mallampati: II TM Distance: >3 FB Neck ROM: Full    Dental No notable dental hx. (+) Teeth Intact and Dental Advisory Given   Pulmonary shortness of breath and with exertion, Current Smoker,  History of R pneumothorax 3 years ago with chest tube.  L side clear; Diminished RLL.  + decreased breath sounds      Cardiovascular negative cardio ROS  Rhythm:Regular Rate:Normal     Neuro/Psych PSYCHIATRIC DISORDERS Bipolar Disorder    GI/Hepatic negative GI ROS, Neg liver ROS,   Endo/Other  negative endocrine ROS  Renal/GU negative Renal ROS  negative genitourinary   Musculoskeletal negative musculoskeletal ROS (+)   Abdominal Normal abdominal exam  (+)   Peds  Hematology negative hematology ROS (+)   Anesthesia Other Findings   Reproductive/Obstetrics                           Anesthesia Physical Anesthesia Plan  ASA: II  Anesthesia Plan: MAC   Post-op Pain Management:    Induction:   Airway Management Planned: Simple Face Mask  Additional Equipment:   Intra-op Plan:   Post-operative Plan:   Informed Consent: I have reviewed the patients History and Physical, chart, labs and discussed the procedure including the risks, benefits and alternatives for the proposed anesthesia with the patient or authorized representative who has indicated his/her understanding and acceptance.   Dental advisory given  Plan Discussed with: CRNA, Anesthesiologist and Surgeon  Anesthesia Plan Comments:         Anesthesia Quick Evaluation

## 2012-11-29 NOTE — Transfer of Care (Signed)
Immediate Anesthesia Transfer of Care Note  Patient: Jay Watts  Procedure(s) Performed: Procedure(s) (LRB) with comments: INSERTION PLEURAL DRAINAGE CATHETER (Right)  Patient Location: PACU  Anesthesia Type:MAC  Level of Consciousness: awake, alert  and oriented  Airway & Oxygen Therapy: Patient Spontanous Breathing and Patient connected to nasal cannula oxygen  Post-op Assessment: Report given to PACU RN and Post -op Vital signs reviewed and stable  Post vital signs: Reviewed and stable  Complications: No apparent anesthesia complications

## 2012-11-29 NOTE — Progress Notes (Signed)
  Postop CXR shows catheter in good position and all of effusion is drained. There is a persistent basilar space because right lower lobe is not fully expanded. It is either trapped due to chronic compression by effusion or there is bronchial obstruction. There is nothing to do about this. Continue daily catheter drainage. Follow up cxr in am.

## 2012-11-30 ENCOUNTER — Encounter (HOSPITAL_COMMUNITY): Payer: Self-pay | Admitting: Surgery

## 2012-11-30 ENCOUNTER — Inpatient Hospital Stay (HOSPITAL_COMMUNITY): Payer: Medicaid Other

## 2012-11-30 ENCOUNTER — Encounter (HOSPITAL_COMMUNITY): Payer: Self-pay

## 2012-11-30 ENCOUNTER — Ambulatory Visit (HOSPITAL_COMMUNITY): Payer: Medicaid Other

## 2012-11-30 ENCOUNTER — Ambulatory Visit (HOSPITAL_COMMUNITY)
Admit: 2012-11-30 | Discharge: 2012-11-30 | Disposition: A | Discharge status: Home / Self Care | Payer: Medicaid Other | Attending: Oncology | Admitting: Oncology

## 2012-11-30 DIAGNOSIS — Z87891 Personal history of nicotine dependence: Secondary | ICD-10-CM

## 2012-11-30 DIAGNOSIS — C3490 Malignant neoplasm of unspecified part of unspecified bronchus or lung: Secondary | ICD-10-CM | POA: Diagnosis present

## 2012-11-30 DIAGNOSIS — J910 Malignant pleural effusion: Principal | ICD-10-CM

## 2012-11-30 HISTORY — DX: Malignant neoplasm of unspecified part of unspecified bronchus or lung: C34.90

## 2012-11-30 MED ORDER — IOHEXOL 300 MG/ML  SOLN
100.0000 mL | Freq: Once | INTRAMUSCULAR | Status: AC | PRN
  Administered 2012-11-30: 100 mL via INTRAVENOUS

## 2012-11-30 MED ORDER — TECHNETIUM TC 99M MEDRONATE IV KIT
25.0000 | PACK | Freq: Once | INTRAVENOUS | Status: AC | PRN
  Administered 2012-11-30: 25 via INTRAVENOUS

## 2012-11-30 MED ORDER — ENSURE COMPLETE PO LIQD
237.0000 mL | Freq: Three times a day (TID) | ORAL | Status: DC
  Administered 2012-11-30: 237 mL via ORAL

## 2012-11-30 NOTE — Evaluation (Signed)
Physical Therapy One Time Evaluation Patient Details Name: Jay Watts MRN: [id number] DOB: 1961-07-13 Today's Date: 11/30/2012 Time: 1478-2956 PT Time Calculation (min): 12 min  PT Assessment / Plan / Recommendation Clinical Impression  Pt admitted with pleural effusion, right: Status post thoracentesis done 12/9 preliminarily positive for adenocarcinoma and now with pleural drainage catheter since 12/12.  Pt in good spirits and reports feeling much better since thoracentesis.  Pt is independent with all mobility and denies SOB with activity.      PT Assessment  Patent does not need any further PT services    Follow Up Recommendations  No PT follow up    Does the patient have the potential to tolerate intense rehabilitation      Barriers to Discharge        Equipment Recommendations  None recommended by PT    Recommendations for Other Services     Frequency      Precautions / Restrictions Precautions Precaution Comments: pleurX chest catheter   Pertinent Vitals/Pain Pt reports no pain at rest and with ambulation, only pain at chest catheter site with transitional movements, reports premedicated.      Mobility  Bed Mobility Bed Mobility: Supine to Sit;Sit to Supine Supine to Sit: 7: Independent Sit to Supine: 7: Independent Details for Bed Mobility Assistance: reports pain at pleurX catheter site only with transitional movements Transfers Transfers: Sit to Stand;Stand to Sit Sit to Stand: 7: Independent Stand to Sit: 7: Independent Ambulation/Gait Ambulation/Gait Assistance: 6: Modified independent (Device/Increase time) Ambulation Distance (Feet): 400 Feet Assistive device: None Ambulation/Gait Assistance Details: pt reports no pain with ambulation and denies any SOB, reports feeling and breathing much better since thoracentesis Gait Pattern: Within Functional Limits    Shoulder Instructions     Exercises     PT Diagnosis:    PT Problem List:   PT  Treatment Interventions:     PT Goals    Visit Information  Last PT Received On: 11/30/12 Assistance Needed: +1    Subjective Data  Subjective: I had a pleural catheter before when I had a collapsed lung. 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  Reports home is in [place]. Prior Function Level of Independence: Independent Driving: Yes Vocation: Full time employment Communication Communication: No difficulties    Cognition  Overall Cognitive Status: Appears within functional limits for tasks assessed/performed Arousal/Alertness: Awake/alert Orientation Level: Appears intact for tasks assessed Behavior During Session: [facility] for tasks performed    Extremity/Trunk Assessment Right Upper Extremity Assessment RUE ROM/Strength/Tone: [facility] for tasks assessed Left Upper Extremity Assessment LUE ROM/Strength/Tone: [facility] for tasks assessed Right Lower Extremity Assessment RLE ROM/Strength/Tone: [facility] for tasks assessed Left Lower Extremity Assessment LLE ROM/Strength/Tone: [facility] for tasks assessed   Balance    End of Session PT - End of Session Activity Tolerance: Patient tolerated treatment well Patient left: in bed;with call bell/phone within reach;with nursing in room  GP     [name],[name] 11/30/2012, 2:25 PM Pager: [phone]

## 2012-11-30 NOTE — Progress Notes (Signed)
11/30/2012, 6:20 PM  Hospital day: 6 Antibiotics: zosyn day 6   EMR information including pleurex placement 11-29-12 by Dr Laneta Simmers and CT AP, head and bone scans reviewed. Pathology report is available, EGFR and ALK testing still in process.  Subjective: Breathing much better since drainage of 2900cc with the pleurex placement yesterday, and not too uncomfortable with this in place. Slept fairly well last pm, walked in hall, is eating, does have some constipation with pain meds.  We have discussed imaging information from standpoint of the adenocarcinoma lung. He hopes to be discharged tomorrow and is glad to be followed up at [facility] as outpatient. Lung coordinator Willette Pa at our office is aware and will set up appointment either with Dr Arbutus Ped or with myself.  Patient's cell is [phone] and alternate contact is his friend Midge Aver [phone].  Objective: Vital signs in last 24 hours: Blood pressure 111/61, pulse 90, temperature 97.9 F (36.6 C), temperature source Oral, resp. rate 18, height 6\' 3"  (1.905 m), weight 170 lb (77.111 kg), SpO2 96.00%.   Intake/Output from previous day: 12/12 0701 - 12/13 0700 In: 1140 [P.O.:240; I.V.:800; IV Piggyback:100] Out: 400 [Urine:400] Intake/Output this shift: Total I/O In: 720 [P.O.:720] Out: 703 [Urine:700; Stool:3]  Physical exam: sitting in bed at 45 degrees, still but looks more comfortable than when I saw him on 11-28-12. Respirations not labored RA. No apparent drainage around pleurex now. Breath sounds heard lower in right anterior chest, no wheeze or rales. Abdomen soft, not distended, few bowel sounds. LE no edema, cords, tenderness.   Lab Results:  Basename 11/28/12 1425  WBC 9.8  HGB 13.9  HCT 39.7  PLT 433*   BMET No results found for this basename: NA:2,K:2,CL:2,CO2:2,GLUCOSE:2,BUN:2,CREATININE:2,CALCIUM:2 in the last 72 hours  Studies/Results: Dg Chest 2 View  11/30/2012  *RADIOLOGY REPORT*  Clinical  Data: Right-sided Pleurx catheter for malignant effusions. Short of breath.  CHEST - 2 VIEW  Comparison: 11/29/2012  Findings: Right-sided hydropneumothorax.  The degree of lung collapse is stable from the previous day's study.  Fluid on the right there are layers in the right lower hemithorax with an air- fluid level.  This difference is likely due to the more erect position on the current exam.  The right lower hemithorax thorax catheter is stable.  There is some increased opacity in the right lung mostly in the lower lobe that is more collapsed consistent with atelectasis/edema.  The left lung is hyperexpanded with some changes of emphysema at the apex but otherwise clear.  No left pneumothorax.  IMPRESSION: Right-sided hydropneumothorax.  The amount of right lung collapse is similar to the previous day's study allowing for differences in patient positioning.   Original Report Authenticated By: Amie Portland, M.D.    Ct Head W Wo Contrast  11/30/2012  *RADIOLOGY REPORT*  Clinical Data: Lung cancer staging  CT HEAD WITHOUT AND WITH CONTRAST  Technique:  Contiguous axial images were obtained from the base of the skull through the vertex without and with intravenous contrast.  Contrast: OMNIPAQUE IOHEXOL 300 MG/ML  SOLN  Comparison: None.  Findings: Ventricle size is normal.  Negative for acute infarct. Small chronic infarct left internal capsule.  Negative for hemorrhage or mass.  No edema in the brain.  Postcontrast images reveal no enhancing lesions.  Calvarium is intact.  Sinuses are clear.  IMPRESSION: Chronic lacunar infarct left internal capsule.  Negative for metastatic disease.   Original Report Authenticated By: Janeece Riggers, M.D.    Nm Bone  Scan Whole Body  11/30/2012  *RADIOLOGY REPORT*  Clinical Data: Diagnosis of lung cancer.  NUCLEAR MEDICINE WHOLE BODY BONE SCINTIGRAPHY  Technique:  Whole body anterior and posterior images were obtained approximately 3 hours after intravenous injection of  radiopharmaceutical.  Radiopharmaceutical: CURIE TC-MDP TECHNETIUM TC 41M MEDRONATE IV KIT  Comparison: None.  Findings: There are no areas of abnormal activity in the skeleton. The patient has a slight thoracolumbar scoliosis.  IMPRESSION: No evidence of metastatic disease to the skeleton.   Original Report Authenticated By: Francene Boyers, M.D.    Ct Abdomen Pelvis W Contrast  11/30/2012  *RADIOLOGY REPORT*  Clinical Data: Lung cancer  CT ABDOMEN AND PELVIS WITH CONTRAST  Technique:  Multidetector CT imaging of the abdomen and pelvis was performed following the standard protocol during bolus administration of intravenous contrast.  Contrast: OMNIPAQUE IOHEXOL 300 MG/ML  SOLN  Comparison: CT chest from 11/25/2012  Findings: Images which include the lower chest show a large right basilar hydropneumothorax with a small bore chest tube in place.  No focal abnormalities seen in the liver or spleen.  The liver measures 18.9 cm in cranial caudal length, enlarged.  The stomach, duodenum, pancreas, gallbladder, and adrenal glands have normal imaging features.  Kidneys are unremarkable.  No abdominal aortic aneurysm.  Lymph nodes are identified in the gastrohepatic ligament measuring up to 13 mm in short axis.  Some of these have central low attenuation raising concern for necrosis.  Imaging through the pelvis shows no free intraperitoneal fluid.  No pelvic sidewall lymphadenopathy.  Prostate gland is unremarkable. Bladder has normal imaging features.  Terminal ileum is normal. The appendix is normal.  Bone windows reveal no worrisome lytic or sclerotic osseous lesions.  IMPRESSION: Hepatomegaly.  Lymph nodes are identified in the gastrohepatic ligament some of which appear to have central necrosis.  Although these lymph nodes are not markedly enlarged, the central low attenuation is concerning for metastatic involvement.  PET CT may provide additional characterization as clinically warranted.  Large  basilar hydropneumothorax.   Original Report Authenticated By: Kennith Center, M.D.    Dg Chest Port 1 View  11/29/2012  *RADIOLOGY REPORT*  Clinical Data: Status post right chest tube placement.  PORTABLE CHEST - 1 VIEW  Comparison: 11/27/2012.  Findings: Interval placement of a small caliber chest tube at the right lung base with evacuation of the previously demonstrated right pleural fluid.  There is an approximately 30% right pneumothorax.  No mediastinal shift.  Mild atelectasis in the right lung.  The left lung is hyperexpanded with stable minimally prominent interstitial markings.  Normal sized heart.  Minimal scoliosis.  IMPRESSION:  1.  Approximately 30% right pneumothorax without mediastinal shift following right chest tube placement. 2.  Mild changes of COPD. 3.  Mild right lung atelectasis.   Original Report Authenticated By: Beckie Salts, M.D.      Assessment/Plan: 1. 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 Pleurex catheter in by Dr Laneta Simmers. Fine from our standpoint to DC this weekend if stable, for outpatient follow up as above. 2.bipolar disorder on medication 3. History of spontaneous pneumothorax 2010  Teirra Carapia P [phone]

## 2012-11-30 NOTE — Op Note (Signed)
Jay Watts, Jay Watts            ACCOUNT NO.:  [id number]  MEDICAL RECORD NO.:  [id number]  LOCATION:  [facility]                         FACILITY:  [facility]  PHYSICIAN:  Evelene Croon, M.D.     DATE OF BIRTH:  [birth year]  DATE OF PROCEDURE:  11/29/2012 DATE OF DISCHARGE:                              OPERATIVE REPORT   PREOPERATIVE DIAGNOSIS:  Malignant right pleural effusion.  POSTOPERATIVE DIAGNOSIS:  Malignant right pleural effusion.  OPERATIVE PROCEDURE:  Insertion of right PleurX catheter.  ATTENDING SURGEON:  Evelene Croon, M.D.  ANESTHESIA:  MAC with local.  CLINICAL HISTORY:  This patient is a 51 year old smoker who was recently diagnosed with a malignant right pleural effusion after presenting with a several week history of progressive shortness of breath and CT scan showed a large right pleural effusion with compressive atelectasis of the right lung.  Thoracentesis was performed by Pulmonary Medicine and removed about 1.5 L of fluid.  Cytology was positive for adenocarcinoma. The chest x-ray after the procedure showed that he still had a large right pleural effusion remaining and it was felt that the PleurX catheter was needed for management of this.  I have discussed the operative procedure with the patient including alternatives, benefits, and risks including, but not limited to, bleeding, infection, injury to lung, pneumothorax, persistent drainage from the catheter requiring pleurodesis, and recurrence of the effusion after the catheter was removed.  He understood all this and agreed to proceed.  OPERATIVE PROCEDURE:  The patient was seen in the preoperative holding area, and the proper patient, proper operation, proper operative site were confirmed after reviewing his CT scan.  The right site of the chest was signed by me.  Preoperative intravenous Ancef was given.  He was taken back to the operating room, placed on the table in supine position.  After intravenous  sedation by Anesthesia, the chest was prepped with Betadine soap and solution and draped in usual sterile manner.  Time-out was taken.  Proper patient, proper operation, proper operative site were confirmed with nursing and anesthesia staff.  Then, 1% lidocaine local anesthesia was used to anesthetize the skin and subcutaneous tissue in the midaxillary line on the right side about the 8th intercostal space.  Local anesthesia was infiltrated down to the pleura, and there was return of serous pleural fluid.  Then, a small stab incision was positioned here, and the needle and catheter were inserted into the pleural space until pleural fluid was removed.  The needle was removed and through the catheter, a guidewire was placed. The catheter was removed.  Then, the skin exit site was chosen in the anterior axillary line just above the right costal margin.  The site was anesthetized with 1% lidocaine local anesthesia, and a small stab incision was made here.  A tunnel was formed between the 2 incisions and this was anesthetized with 1% lidocaine local anesthesia.  Then, the catheter was cut to the appropriate length and was brought through the subcutaneous tunnel until the cuff was lined just inside the skin exit site.  Then, the introducer and sheath were inserted over the guidewire and advanced into the pleural space.  The guidewire and introducer were removed, and through  the peel-away sheath, the catheter was inserted into the pleural space.  The sheath was removed.  Catheter was connected to vacuum bottle suction and about 2900 mL of serous fluid was removed. This was not sent for any studies since it had already been sent for cytology.  The catheter was then capped.  The catheter was fixed to the skin with a nylon suture.  The chest wall entry site was closed with a 3- 0 Vicryl subcuticular skin stitch.  The sponge, needle, and instrument counts were correct according to the scrub nurse.   Dry sterile dressing was applied around the catheter.  The catheter was then coiled, on top of this, another dressing placed followed by a waterproof covering over both the sites.  The patient tolerated the procedure well and was transported to the post anesthesia care unit in satisfactory and stable condition.     Evelene Croon, M.D.     BB/MEDQ  D:  11/29/2012  T:  11/30/2012  Job:  [id number]

## 2012-11-30 NOTE — Progress Notes (Signed)
[facility] PROGRESS NOTE  Rodney Yera ZOX:[id number] DOB: 1961/12/18 DOA: 11/25/2012 PCP: No primary provider on file.  Assessment/Plan: 51 year old white male with past medical history tobacco abuse and bipolar disorder who presented with several days of worsening shortness of breath. His son have a large right pleural effusion concerning for malignancy. Patient was started on antibiotics and taste that this was infectious. Pulmonary was consulted who performed a thoracentesis on 12/9. Fluid was sent for cytology is preliminarily positive for adenocarcinoma. 1.5 L were removed the patient had some improvement. Repeat x-rays note still persistence of significant amount of pleural effusion.   Assessment/Plan:  Active Problems:  Pleural effusion, right: Status post thoracentesis done 12/9 preliminarily positive for adenocarcinoma. Now with pleural drainage catheter since 12/12 by Dr. Laneta Simmers.  - Medical oncology consultation  - CT surgery consultation  - Appreciate pulmonlogy recommendations  - Plueral fluid studies ngtd - pleural fluid showed 61% mono, LDH 377, ph 7.5, wbc 889, lymphs 30%, eos 5%, neutro 4%  Adenocarcinoma of lung : CT AP and head pending. Bone scan negative for cancer.   Tobacco abuse: Counseled   Bipolar 1 disorder: Stable. On lamotrigine, mirtazapine   Dyspnea: Secondary to effusion and improved with initial thoracentesis and now with drainage catheter. Cont alb/ipra.   Constipation: Prn miralax.   Mild PCM, encourage supplements  Code Status: Full  Family Communication: Results and plan discussed with patient at the bedside.  Disposition Plan: home hopefully in the next few days   Consultants:  *Dr.Yacoub-Pulmonary Medicine Dr. Laneta Simmers - CTS Dr. Darrold Span - Oncology  Procedures:  Status post thoracentesis of right lung on 12/9 yielding 1.5 L of fluid Pleural drainage catheter on 12/12  Antibiotics:  IV Zosyn day 4  HPI/Subjective:  Patient  breathing better after thoracentesis and feels less pressure on that side, but still has some pressure. Denies nausea, eating well. Denies diarrhea and constipation.   Objective: Filed Vitals:   11/29/12 1845 11/29/12 1846 11/29/12 2137 11/30/12 0612  BP:  103/64 107/78 99/68  Pulse: 72 72 89 82  Temp:    98.1 F (36.7 C)  TempSrc:    Oral  Resp: 19 17 18 20   Height:      Weight:      SpO2: 98% 95% 96% 91%    Intake/Output Summary (Last 24 hours) at 11/30/12 1246 Last data filed at 11/30/12 0900  Gross per 24 hour  Intake   1380 ml  Output    700 ml  Net    680 ml   Filed Weights   11/25/12 1547  Weight: 77.111 kg (170 lb)    Exam: General: Tall thin, marfanoid CM, NAD  Cardiovascular: RRR, no m/r/g Respiratory: Decreased BS on right side, catheter noted Abdomen: Soft, NT, ND, BS+ Extremities: No clubbing or cyanosis or edema   Data Reviewed: Basic Metabolic Panel:  Lab 11/26/12 8119 11/25/12 1001  NA 138 134*  K 4.6 4.3  CL 101 95*  CO2 27 26  GLUCOSE 89 96  BUN 9 9  CREATININE 0.94 0.90  CALCIUM 9.0 9.5  MG -- --  PHOS -- --   Liver Function Tests:  Lab 11/27/12 1635 11/26/12 0500 11/25/12 1001  AST -- 13 15  ALT -- 9 12  ALKPHOS -- 82 98  BILITOT -- 0.3 0.4  PROT 6.6 6.0 7.2  ALBUMIN -- 2.8* 3.5   No results found for this basename: LIPASE:5,AMYLASE:5 in the last 168 hours No results found for this basename: AMMONIA:5  in the last 168 hours CBC:  Lab 11/28/12 1425 11/26/12 0500 11/25/12 1001  WBC 9.8 9.1 9.0  NEUTROABS -- -- --  HGB 13.9 15.0 15.5  HCT 39.7 42.8 45.1  MCV 93.0 92.4 92.4  PLT 433* 448* 496*   Cardiac Enzymes: No results found for this basename: CKTOTAL:5,CKMB:5,CKMBINDEX:5,TROPONINI:5 in the last 168 hours BNP (last 3 results) No results found for this basename: PROBNP:3 in the last 8760 hours CBG: No results found for this basename: GLUCAP:5 in the last 168 hours  Recent Results (from the past 240 hour(s))  MRSA PCR  SCREENING     Status: Normal   Collection Time   11/25/12  6:31 PM      Component Value Range Status Comment   MRSA by PCR NEGATIVE  NEGATIVE Final   AFB CULTURE WITH SMEAR     Status: Normal (Preliminary result)   Collection Time   11/26/12  2:17 PM      Component Value Range Status Comment   Specimen Description LUNG   Final    Special Requests Normal   Final    ACID FAST SMEAR NO ACID FAST BACILLI SEEN   Final    Culture     Final    Value: CULTURE WILL BE EXAMINED FOR 6 WEEKS BEFORE ISSUING A FINAL REPORT   Report Status PENDING   Incomplete   BODY FLUID CULTURE     Status: Normal   Collection Time   11/26/12  2:17 PM      Component Value Range Status Comment   Specimen Description LUNG   Final    Special Requests Normal   Final    Gram Stain     Final    Value: WBC PRESENT,BOTH PMN AND MONONUCLEAR     NO ORGANISMS SEEN   Culture NO GROWTH 3 DAYS   Final    Report Status 11/30/2012 FINAL   Final   FUNGAL STAIN     Status: Normal   Collection Time   11/26/12  2:18 PM      Component Value Range Status Comment   Specimen Description FLUID PLEURAL   Final    Special Requests Normal   Final    Fungal Smear NO YEAST OR FUNGAL ELEMENTS SEEN   Final    Report Status 11/27/2012 FINAL   Final   SURGICAL PCR SCREEN     Status: Normal   Collection Time   11/28/12  4:58 PM      Component Value Range Status Comment   MRSA, PCR NEGATIVE  NEGATIVE Final    Staphylococcus aureus NEGATIVE  NEGATIVE Final      Studies: Dg Chest 2 View  11/30/2012  *RADIOLOGY REPORT*  Clinical Data: Right-sided Pleurx catheter for malignant effusions. Short of breath.  CHEST - 2 VIEW  Comparison: 11/29/2012  Findings: Right-sided hydropneumothorax.  The degree of lung collapse is stable from the previous day's study.  Fluid on the right there are layers in the right lower hemithorax with an air- fluid level.  This difference is likely due to the more erect position on the current exam.  The right lower  hemithorax thorax catheter is stable.  There is some increased opacity in the right lung mostly in the lower lobe that is more collapsed consistent with atelectasis/edema.  The left lung is hyperexpanded with some changes of emphysema at the apex but otherwise clear.  No left pneumothorax.  IMPRESSION: Right-sided hydropneumothorax.  The amount of right lung collapse is similar  to the previous day's study allowing for differences in patient positioning.   Original Report Authenticated By: Amie Portland, M.D.    Nm Bone Scan Whole Body  11/30/2012  *RADIOLOGY REPORT*  Clinical Data: Diagnosis of lung cancer.  NUCLEAR MEDICINE WHOLE BODY BONE SCINTIGRAPHY  Technique:  Whole body anterior and posterior images were obtained approximately 3 hours after intravenous injection of radiopharmaceutical.  Radiopharmaceutical: CURIE TC-MDP TECHNETIUM TC 64M MEDRONATE IV KIT  Comparison: None.  Findings: There are no areas of abnormal activity in the skeleton. The patient has a slight thoracolumbar scoliosis.  IMPRESSION: No evidence of metastatic disease to the skeleton.   Original Report Authenticated By: Francene Boyers, M.D.    Dg Chest Port 1 View  11/29/2012  *RADIOLOGY REPORT*  Clinical Data: Status post right chest tube placement.  PORTABLE CHEST - 1 VIEW  Comparison: 11/27/2012.  Findings: Interval placement of a small caliber chest tube at the right lung base with evacuation of the previously demonstrated right pleural fluid.  There is an approximately 30% right pneumothorax.  No mediastinal shift.  Mild atelectasis in the right lung.  The left lung is hyperexpanded with stable minimally prominent interstitial markings.  Normal sized heart.  Minimal scoliosis.  IMPRESSION:  1.  Approximately 30% right pneumothorax without mediastinal shift following right chest tube placement. 2.  Mild changes of COPD. 3.  Mild right lung atelectasis.   Original Report Authenticated By: Beckie Salts, M.D.     Scheduled  Meds:   . lamoTRIgine  150 mg Oral QHS  . mirtazapine  45 mg Oral QHS  . nicotine  21 mg Transdermal Daily  . piperacillin-tazobactam (ZOSYN)  IV  3.375 g Intravenous Q8H  . sodium chloride  3 mL Intravenous Q12H   Continuous Infusions:   Active Problems:  Pleural effusion, right  Tobacco abuse  Bipolar 1 disorder  Dyspnea  Constipation  Time spent: 45 min  Ambria Mayfield V.  [facility] Pager [phone]. If 8PM-8AM, please contact night-coverage at [email or web address], password [facility] 11/30/2012, 12:46 PM  LOS: 5 days

## 2012-12-01 DIAGNOSIS — C3490 Malignant neoplasm of unspecified part of unspecified bronchus or lung: Secondary | ICD-10-CM

## 2012-12-01 LAB — CREATININE, SERUM
GFR calc Af Amer: >90 mL/min (ref >90)
GFR calc non Af Amer: >90 mL/min (ref >90)

## 2012-12-01 MED ORDER — ONDANSETRON HCL 4 MG PO TABS: 4.0000 mg | ORAL_TABLET | Freq: Four times a day (QID) | ORAL | Status: DC | PRN

## 2012-12-01 MED ORDER — HYDROCODONE-ACETAMINOPHEN 5-500 MG PO CAPS: 1.0000 | ORAL_CAPSULE | Freq: Four times a day (QID) | ORAL | Status: DC | PRN

## 2012-12-01 NOTE — Progress Notes (Signed)
OT Note:  Pt was independent with PT yesterday.  Checked with pt and he has no OT needs.  Screen only.  [place], OTR/L [phone] 12/01/2012

## 2012-12-01 NOTE — Discharge Summary (Signed)
Physician Discharge Summary  Sylvester Minton NWG:[id number] DOB: [birth year] DOA: 11/25/2012  PCP: No primary provider on file.  Admit date: 11/25/2012 Discharge date: 12/01/2012  Recommendations for Outpatient Follow-up:  Oncology as planned with patient.  HH care nurse to drain pleurax catheter daily starting from Monday.   Discharge Diagnoses:  Principal Problem:  *Adenocarcinoma, lung Active Problems:  Pleural effusion, right  Dyspnea  Tobacco abuse  Bipolar 1 disorder  Constipation   Discharge Condition: stable  Diet recommendation: regular  Filed Weights   11/25/12 1547  Weight: 77.111 kg (170 lb)    History of present illness:  This is a 51 year old male, smoker, with history of recently diagnosed bipolar disorder, previous spontaneous right pneumothorax 04/2009, treated with chest tube at [facility], [place], [place], previous ETOH abuse, quit about 4 years ago, presenting with intermittent right-sided chest pain, for the past week and a half, a cough, productive of yellowish phlegm, for one week, and progressive shortness of breath in the last week, which has got so bad, that since 11/23/12, he has had very poor effort tolerance, having to sit down frequently to rest, at work. He has become very fatigued, appetite has been poor in the last week, and he has lost about 4-5 lbs in that period of time. He denies fever or chills   Hospital Course:  51 year old white male with past medical history tobacco abuse and bipolar disorder who presented with several days of worsening shortness of breath. He had a large right pleural effusion concerning for malignancy. Patient was started on antibiotics.. Pulmonary was consulted who performed a thoracentesis on 12/9. Fluid was sent for cytology is preliminarily positive for adenocarcinoma. 1.5 L were removed the patient had some improvement. Repeat x-rays note still persistence of significant amount of pleural effusion. Now with pleural  drainage catheter since 12/12 by Dr. Laneta Simmers. Medical oncology consultated also. They will f/u with patient outpatient. Plueral fluid studies were ngtd. CT AP with possible LN involvement. CT head and bone scan were negative for cancer. Tobacco abuse: He was counseled. Bipolar 1 disorder was stable on lamotrigine, mirtazapine  Dyspnea: Secondary to effusion and improved with initial thoracentesis and now with drainage catheter. Cont alb/ipra on discharge.    Consultants:  *Dr.Yacoub-Pulmonary Medicine  Dr. Laneta Simmers - CTS  Dr. Darrold Span - Oncology  Procedures:  Status post thoracentesis of right lung on 12/9 yielding 1.5 L of fluid  Pleural drainage catheter on 12/12  CT head, chest and abdomen/pelvis Bone scan Antibiotics:  IV Zosyn day 4    Discharge Exam: Filed Vitals:   11/30/12 0612 11/30/12 1403 11/30/12 2045 12/01/12 0559  BP: 99/68 111/61 109/70 102/64  Pulse: 82 90 85 86  Temp: 98.1 F (36.7 C) 97.9 F (36.6 C) 98.2 F (36.8 C) 98 F (36.7 C)  TempSrc: Oral Oral Oral Oral  Resp: 20 18 18 18   Height:      Weight:      SpO2: 91% 96% 96% 96%    General: A, O x 3, NAD Cardiovascular: S1S2 reg, no m/r/g Respiratory: CTAB, no w/r/c, cath ok  Discharge Instructions     Medication List     As of 12/01/2012  3:52 PM    TAKE these medications         hydrocodone-acetaminophen 5-500 MG per capsule   Commonly known as: LORCET-HD   Take 1 capsule by mouth every 6 (six) hours as needed for pain.      lamoTRIgine 150 MG tablet  Commonly known as: LAMICTAL   Take 150 mg by mouth at bedtime.      mirtazapine 45 MG tablet   Commonly known as: REMERON   Take 45 mg by mouth at bedtime.      naproxen sodium 220 MG tablet   Commonly known as: ANAPROX   Take 220 mg by mouth 2 (two) times daily with a meal.      ondansetron 4 MG tablet   Commonly known as: ZOFRAN   Take 1 tablet (4 mg total) by mouth every 6 (six) hours as needed for nausea.           Follow-up  Information    Follow up with LIVESAY,LENNIS P, MD. Schedule an appointment as soon as possible for a visit in 1 week.   Contact information:   [address] [place] [address] [phone]           The results of significant diagnostics from this hospitalization (including imaging, microbiology, ancillary and laboratory) are listed below for reference.    Significant Diagnostic Studies: Dg Chest 2 View  11/30/2012  *RADIOLOGY REPORT*  Clinical Data: Right-sided Pleurx catheter for malignant effusions. Short of breath.  CHEST - 2 VIEW  Comparison: 11/29/2012  Findings: Right-sided hydropneumothorax.  The degree of lung collapse is stable from the previous day's study.  Fluid on the right there are layers in the right lower hemithorax with an air- fluid level.  This difference is likely due to the more erect position on the current exam.  The right lower hemithorax thorax catheter is stable.  There is some increased opacity in the right lung mostly in the lower lobe that is more collapsed consistent with atelectasis/edema.  The left lung is hyperexpanded with some changes of emphysema at the apex but otherwise clear.  No left pneumothorax.  IMPRESSION: Right-sided hydropneumothorax.  The amount of right lung collapse is similar to the previous day's study allowing for differences in patient positioning.   Original Report Authenticated By: Amie Portland, M.D.    Dg Chest 2 View  11/27/2012  *RADIOLOGY REPORT*  Clinical Data: Short of breath  CHEST - 2 VIEW  Comparison: 11/26/2012  Findings: Left lung remains hyperaerated with chronic changes at the left lung apex.  Large right pleural effusion is stable.  No pneumothorax.  Cardiac silhouette obscured. Areas of volume loss in the right upper lung zone have improved.  IMPRESSION: Stable large right pleural effusion.  No pneumothorax. Improved volume loss in the aerated right upper lung zone.   Original Report Authenticated By: Jolaine Click,  M.D.    Dg Chest 2 View  11/25/2012  *RADIOLOGY REPORT*  Clinical Data: Chest pain, shortness of breath  CHEST - 2 VIEW  Comparison: None.  Findings: Cardiomediastinal silhouette is unremarkable.  There is moderate to large right pleural effusion with right lower lobe atelectasis atelectasis or infiltrate.  Left lung is clear.  IMPRESSION: Moderate to large right pleural effusion with right lower lobe atelectasis or infiltrate.   Original Report Authenticated By: Natasha Mead, M.D.    Ct Head W Wo Contrast  11/30/2012  *RADIOLOGY REPORT*  Clinical Data: Lung cancer staging  CT HEAD WITHOUT AND WITH CONTRAST  Technique:  Contiguous axial images were obtained from the base of the skull through the vertex without and with intravenous contrast.  Contrast: OMNIPAQUE IOHEXOL 300 MG/ML  SOLN  Comparison: None.  Findings: Ventricle size is normal.  Negative for acute infarct. Small chronic infarct left internal  capsule.  Negative for hemorrhage or mass.  No edema in the brain.  Postcontrast images reveal no enhancing lesions.  Calvarium is intact.  Sinuses are clear.  IMPRESSION: Chronic lacunar infarct left internal capsule.  Negative for metastatic disease.   Original Report Authenticated By: Janeece Riggers, M.D.    Ct Chest W Contrast  11/25/2012  *RADIOLOGY REPORT*  Clinical Data: Chest pain, shortness of breath  CT CHEST WITH CONTRAST  Technique:  Multidetector CT imaging of the chest was performed following the standard protocol during bolus administration of intravenous contrast.  Contrast: OMNIPAQUE IOHEXOL 300 MG/ML  SOLN  Comparison: Chest x-ray same day.  Findings: No destructive bony lesions are noted.  Central airways are patent.  Bilateral apical large paraseptal emphysematous bullae are noted.  There is a large right pleural effusion.  There is significant atelectasis in the right middle lobe partially right upper lobe and right lower lobe.  There is some pleural thickening in the right  midlung posteriorly best seen in axial image 44.  Malignant pleural effusion cannot be excluded.  Correlation with fluid analysis and cytology is recommended.  No definite enhancing lung mass is noted.  There is some low density in the right lower lobe centrally measures about 2 cm.  Called to mass cannot be excluded.  Further evaluation with bronchoscopy or repeat CT examination after lung re-expansion is recommended to exclude a subtle mass.  A subcarinal lymph node measures 2.4 x 1.7 cm.  There is a right hilar lymph node measures 2.2 x 1.8 cm.  Pretracheal lymph node measures 2.3 x 1.9 cm.  There is a right anterior mediastinal lymph node measures 1.9 x 1.5 cm. A right paratracheal lymph node measures 1.6 x 1.4 cm.  Small pericardial effusion.  There is a right anterior diaphragmatic nodule or lymph node best seen in axial image 56 measures 1.1 cm. Pathologic adenopathy or metastatic disease cannot be excluded.  No central pulmonary embolus is noted.  Left lung is clear. Minimal atelectasis noted in the left lower lobe.  IMPRESSION:  1.  A large right pleural effusion is noted.  There is some pleural thickening in the right lung posteriorly.  Highly suspicious for malignant pleural effusion.  Correlation with fluid analysis and cytology is recommended.  There is atelectasis of the right lower lobe right middle lobe and partial atelectasis of the right upper lobe. 2.  There is mediastinal and right hilar adenopathy. 3.  No definite enhancing lung mass is noted.  There is some central low density within the right lower lobe.  A occult mass cannot be excluded. Correlation with bronchoscopy or repeat CT scan after lung re-expansion is recommended. 4.  A right anterior diaphragmatic nodule or lymph node measures 1.1 cm. Metastatic adenopathy cannot be excluded.   Original Report Authenticated By: Natasha Mead, M.D.    Nm Bone Scan Whole Body  11/30/2012  *RADIOLOGY REPORT*  Clinical Data: Diagnosis of lung cancer.   NUCLEAR MEDICINE WHOLE BODY BONE SCINTIGRAPHY  Technique:  Whole body anterior and posterior images were obtained approximately 3 hours after intravenous injection of radiopharmaceutical.  Radiopharmaceutical: CURIE TC-MDP TECHNETIUM TC 2M MEDRONATE IV KIT  Comparison: None.  Findings: There are no areas of abnormal activity in the skeleton. The patient has a slight thoracolumbar scoliosis.  IMPRESSION: No evidence of metastatic disease to the skeleton.   Original Report Authenticated By: Francene Boyers, M.D.    Ct Abdomen Pelvis W Contrast  11/30/2012  *RADIOLOGY REPORT*  Clinical  Data: Lung cancer  CT ABDOMEN AND PELVIS WITH CONTRAST  Technique:  Multidetector CT imaging of the abdomen and pelvis was performed following the standard protocol during bolus administration of intravenous contrast.  Contrast: OMNIPAQUE IOHEXOL 300 MG/ML  SOLN  Comparison: CT chest from 11/25/2012  Findings: Images which include the lower chest show a large right basilar hydropneumothorax with a small bore chest tube in place.  No focal abnormalities seen in the liver or spleen.  The liver measures 18.9 cm in cranial caudal length, enlarged.  The stomach, duodenum, pancreas, gallbladder, and adrenal glands have normal imaging features.  Kidneys are unremarkable.  No abdominal aortic aneurysm.  Lymph nodes are identified in the gastrohepatic ligament measuring up to 13 mm in short axis.  Some of these have central low attenuation raising concern for necrosis.  Imaging through the pelvis shows no free intraperitoneal fluid.  No pelvic sidewall lymphadenopathy.  Prostate gland is unremarkable. Bladder has normal imaging features.  Terminal ileum is normal. The appendix is normal.  Bone windows reveal no worrisome lytic or sclerotic osseous lesions.  IMPRESSION: Hepatomegaly.  Lymph nodes are identified in the gastrohepatic ligament some of which appear to have central necrosis.  Although these lymph nodes are not markedly  enlarged, the central low attenuation is concerning for metastatic involvement.  PET CT may provide additional characterization as clinically warranted.  Large basilar hydropneumothorax.   Original Report Authenticated By: Kennith Center, M.D.    Dg Chest Port 1 View  11/29/2012  *RADIOLOGY REPORT*  Clinical Data: Status post right chest tube placement.  PORTABLE CHEST - 1 VIEW  Comparison: 11/27/2012.  Findings: Interval placement of a small caliber chest tube at the right lung base with evacuation of the previously demonstrated right pleural fluid.  There is an approximately 30% right pneumothorax.  No mediastinal shift.  Mild atelectasis in the right lung.  The left lung is hyperexpanded with stable minimally prominent interstitial markings.  Normal sized heart.  Minimal scoliosis.  IMPRESSION:  1.  Approximately 30% right pneumothorax without mediastinal shift following right chest tube placement. 2.  Mild changes of COPD. 3.  Mild right lung atelectasis.   Original Report Authenticated By: Beckie Salts, M.D.    Dg Chest Port 1 View  11/26/2012  *RADIOLOGY REPORT*  Clinical Data: Thoracentesis  PORTABLE CHEST - 1 VIEW  Comparison: Yesterday  Findings: No pneumothorax post right thoracentesis.  Right pleural effusion is improved but remains prominent. Left lung remains hyperaerated.  Shift of the mediastinum to the left has normalized and is now midline.  IMPRESSION: No pneumothorax post thoracentesis.   Original Report Authenticated By: Jolaine Click, M.D.     Microbiology: Recent Results (from the past 240 hour(s))  MRSA PCR SCREENING     Status: Normal   Collection Time   11/25/12  6:31 PM      Component Value Range Status Comment   MRSA by PCR NEGATIVE  NEGATIVE Final   AFB CULTURE WITH SMEAR     Status: Normal (Preliminary result)   Collection Time   11/26/12  2:17 PM      Component Value Range Status Comment   Specimen Description LUNG   Final    Special Requests Normal   Final    ACID FAST  SMEAR NO ACID FAST BACILLI SEEN   Final    Culture     Final    Value: CULTURE WILL BE EXAMINED FOR 6 WEEKS BEFORE ISSUING A FINAL REPORT   Report  Status PENDING   Incomplete   BODY FLUID CULTURE     Status: Normal   Collection Time   11/26/12  2:17 PM      Component Value Range Status Comment   Specimen Description LUNG   Final    Special Requests Normal   Final    Gram Stain     Final    Value: WBC PRESENT,BOTH PMN AND MONONUCLEAR     NO ORGANISMS SEEN   Culture NO GROWTH 3 DAYS   Final    Report Status 11/30/2012 FINAL   Final   FUNGAL STAIN     Status: Normal   Collection Time   11/26/12  2:18 PM      Component Value Range Status Comment   Specimen Description FLUID PLEURAL   Final    Special Requests Normal   Final    Fungal Smear NO YEAST OR FUNGAL ELEMENTS SEEN   Final    Report Status 11/27/2012 FINAL   Final   SURGICAL PCR SCREEN     Status: Normal   Collection Time   11/28/12  4:58 PM      Component Value Range Status Comment   MRSA, PCR NEGATIVE  NEGATIVE Final    Staphylococcus aureus NEGATIVE  NEGATIVE Final      Labs: Basic Metabolic Panel:  Lab 12/01/12 1610 11/26/12 0500 11/25/12 1001  NA -- 138 134*  K -- 4.6 4.3  CL -- 101 95*  CO2 -- 27 26  GLUCOSE -- 89 96  BUN -- 9 9  CREATININE 0.87 0.94 0.90  CALCIUM -- 9.0 9.5  MG -- -- --  PHOS -- -- --   Liver Function Tests:  Lab 11/27/12 1635 11/26/12 0500 11/25/12 1001  AST -- 13 15  ALT -- 9 12  ALKPHOS -- 82 98  BILITOT -- 0.3 0.4  PROT 6.6 6.0 7.2  ALBUMIN -- 2.8* 3.5   No results found for this basename: LIPASE:5,AMYLASE:5 in the last 168 hours No results found for this basename: AMMONIA:5 in the last 168 hours CBC:  Lab 11/28/12 1425 11/26/12 0500 11/25/12 1001  WBC 9.8 9.1 9.0  NEUTROABS -- -- --  HGB 13.9 15.0 15.5  HCT 39.7 42.8 45.1  MCV 93.0 92.4 92.4  PLT 433* 448* 496*   Cardiac Enzymes: No results found for this basename: CKTOTAL:5,CKMB:5,CKMBINDEX:5,TROPONINI:5 in the last  168 hours BNP: BNP (last 3 results) No results found for this basename: PROBNP:3 in the last 8760 hours CBG: No results found for this basename: GLUCAP:5 in the last 168 hours  Time coordinating discharge: 45 minutes  Signed:  Laportia Carley V.  [facility] 12/01/2012, 3:52 PM

## 2012-12-01 NOTE — Care Management Note (Signed)
    Page 1 of 2   12/01/2012     4:16:35 PM   CARE MANAGEMENT NOTE 12/01/2012  Patient:  Jay Watts, Jay Watts   Account Number:  [id number]  Date Initiated:  11/26/2012  Documentation initiated by:  Jiles Crocker  Subjective/Objective Assessment:   ADMITTED WITH PLEURAL EFFUSION     Action/Plan:   INDEPENDENT PRIOR TO ADMISSION   Anticipated DC Date:     Anticipated DC Plan:        DC Planning Services  CM consult      Choice offered to / List presented to:          [facility] arranged  HH-1 RN      [facility] agency  [facility]   Status of service:  Completed, signed off Medicare Important Message given?   (If response is "NO", the following Medicare IM given date fields will be blank) Date Medicare IM given:   Date Additional Medicare IM given:    Discharge Disposition:  HOME W HOME HEALTH SERVICES  Per UR Regulation:  Reviewed for med. necessity/level of care/duration of stay  If discussed at Long Length of Stay Meetings, dates discussed:    Comments:  12/01/12 1400 Spoke with Dr. Allena Katz about discharging pt. today.  Pt. had PleurX catheter placed on 11/29/12, however did not have the case of pleurx catheters to take home with him.  Called [employer] management to order 1 case of Pleurx catheters for pt. and had Security to bring to pt.'s room.  TC to [facility] to give referral for [facility] RN to drain pleurx catheters.  Pt. will be discharged home to address:  [address] [place], [place] [address].  Pt. contact info:  [phone]. Faxed paperwork to order 3 months of Pleurx catheters to [facility] [phone]).  In addition, VM was left at [facility] [phone]) to order the 29month supply of catheters.  Gave pt. original copies of faxed paperwork for pleurx catheter supplies. Tera Mater, RN, BSN NCM

## 2012-12-03 ENCOUNTER — Telehealth: Payer: Self-pay | Admitting: Internal Medicine

## 2012-12-03 NOTE — Telephone Encounter (Addendum)
Pt aware of his np appt 12/05/12 @ 1:30

## 2012-12-03 NOTE — Telephone Encounter (Signed)
C/D 12/03/12 for appt.12/05/12

## 2012-12-05 ENCOUNTER — Telehealth: Payer: Self-pay | Admitting: Internal Medicine

## 2012-12-05 ENCOUNTER — Ambulatory Visit: Payer: Self-pay

## 2012-12-05 ENCOUNTER — Other Ambulatory Visit (HOSPITAL_BASED_OUTPATIENT_CLINIC_OR_DEPARTMENT_OTHER): Payer: Self-pay | Admitting: Lab

## 2012-12-05 ENCOUNTER — Encounter: Payer: Self-pay | Admitting: Internal Medicine

## 2012-12-05 ENCOUNTER — Ambulatory Visit (HOSPITAL_BASED_OUTPATIENT_CLINIC_OR_DEPARTMENT_OTHER): Payer: Self-pay | Admitting: Internal Medicine

## 2012-12-05 VITALS — BP 123/74 | HR 94 | Temp 97.1°F | Resp 20 | Ht 74.0 in | Wt 153.9 lb

## 2012-12-05 DIAGNOSIS — R0602 Shortness of breath: Secondary | ICD-10-CM

## 2012-12-05 DIAGNOSIS — R634 Abnormal weight loss: Secondary | ICD-10-CM

## 2012-12-05 DIAGNOSIS — C3490 Malignant neoplasm of unspecified part of unspecified bronchus or lung: Secondary | ICD-10-CM

## 2012-12-05 DIAGNOSIS — C3430 Malignant neoplasm of lower lobe, unspecified bronchus or lung: Secondary | ICD-10-CM

## 2012-12-05 DIAGNOSIS — C779 Secondary and unspecified malignant neoplasm of lymph node, unspecified: Secondary | ICD-10-CM

## 2012-12-05 LAB — CBC WITH DIFFERENTIAL/PLATELET
BASO%: 0.2 % (ref 0.0–2.0)
HCT: 43.3 % (ref 38.4–49.9)
MCHC: 33.8 g/dL (ref 32.0–36.0)
MONO#: 0.6 10*3/uL (ref 0.1–0.9)
NEUT%: 70.6 % (ref 39.0–75.0)
WBC: 9.8 10*3/uL (ref 4.0–10.3)
lymph#: 2 10*3/uL (ref 0.9–3.3)

## 2012-12-05 LAB — COMPREHENSIVE METABOLIC PANEL (CC13)
ALT: 14 U/L (ref 0–55)
Albumin: 3.1 g/dL — ABNORMAL LOW (ref 3.5–5.0)
CO2: 32 mEq/L — ABNORMAL HIGH (ref 22–29)
Calcium: 9.3 mg/dL (ref 8.4–10.4)
Chloride: 97 mEq/L — ABNORMAL LOW (ref 98–107)
Creatinine: 0.8 mg/dL (ref 0.7–1.3)
Sodium: 144 mEq/L (ref 136–145)
Total Protein: 6.3 g/dL — ABNORMAL LOW (ref 6.4–8.3)

## 2012-12-05 NOTE — Progress Notes (Signed)
[facility] [phone]   [phone]  OFFICE PROGRESS NOTE   DIAGNOSIS: Metastatic non-small cell lung cancer, adenocarcinoma with negative ALK gene translocation diagnosed in December of 2013.  EGFR mutation status is still pending.  PRIOR THERAPY: Status post Pleurx catheter placement for drainage of right-sided pleural effusion under the care of Dr. Laneta Simmers on 11/29/2012.  CURRENT THERAPY: None  INTERVAL HISTORY: Jay Watts 51 y.o. male returns to the clinic today for hospital followup visit. The patient was seen for initial consultation by my partner Dr. Darrold Span after he was diagnosed with metastatic lung cancer. She arranged for a followup visit for him with me today for evaluation and recommendation regarding treatment of his condition. The patient mentions that in early December of 2013 he started complaining of increasing shortness of breath, cough as well as weight loss and pain on the right side of the chest. He was seen at the emergency Department at [facility] and chest x-ray on 11/25/2012 showed moderate to large right pleural effusion with right lower lobe atelectasis or infiltrate. This was followed by CT scan of the chest on the same day and it showed There is some pleural thickening in the right midlung posteriorly best seen in axial image 44. Malignant pleural effusion cannot be excluded. Correlation with fluid analysis and cytology is recommended. No definite enhancing lung mass is noted. There is some low density in the right lower lobe centrally measures about 2 cm. mass cannot be excluded. Further evaluation with bronchoscopy or repeat CT examination after lung re-expansion is recommended to exclude a subtle mass. A subcarinal lymph node measures 2.4 x 1.7 cm. There is a right hilar lymph node measures 2.2 x 1.8 cm. Pretracheal lymph node measures 2.3 x 1.9 cm. There is a right anterior mediastinal lymph node measures 1.9 x  1.5 cm. A right paratracheal lymph node measures 1.6 x 1.4 cm. Small pericardial effusion. There is a right anterior diaphragmatic nodule or lymph node best seen in axial image 56 measures 1.1 cm. Pathologic adenopathy or metastatic disease cannot be excluded. CT of the head as well as whole body bone scan were negative for metastatic disease. CT of the abdomen on 11/30/2012 showed hepatomegaly as well as lymph nodes identified in the gastrohepatic ligament, some of which appears to have central necrosis concerning for metastatic involvement. On 11/29/2012, the patient underwent insertion of right Pleurx catheter by Dr. Laneta Simmers. The cytology of the pleural fluid showed malignant cells consistent with metastatic adenocarcinoma. Immunostains were performed and the tumor cells are strongly positive for CK7, TTF-1, negative for napsin A, CK20 and CDX-2. The overall findings are diagnostic for adenocarcinoma of lung primary. The cell block material was sent out for EGFR and ALK testing. ALK gene translocation was negative but the EGFR mutation status is still pending.  The patient is here today for evaluation and recommendation regarding treatment of his recently diagnosed metastatic lung cancer. He still complaining of fatigue and lack of appetite. He has shortness of breath with exertion. He denied having any significant chest pain, cough or hemoptysis. He lost several pounds recently.   MEDICAL HISTORY: Past Medical History  Diagnosis Date  . Bipolar 2 disorder   . Shortness of breath     with exertion  . Pneumothorax     right side- spring 2013    ALLERGIES:   has no known allergies.  MEDICATIONS:  Current Outpatient Prescriptions  Medication Sig Dispense Refill  .  lamoTRIgine (LAMICTAL) 150 MG tablet Take 150 mg by mouth at bedtime.      . mirtazapine (REMERON) 45 MG tablet Take 45 mg by mouth at bedtime.      . naproxen sodium (ANAPROX) 220 MG tablet Take 220 mg by mouth 2 (two) times daily  with a meal.      . hydrocodone-acetaminophen (LORCET-HD) 5-500 MG per capsule Take 1 capsule by mouth every 6 (six) hours as needed for pain.  30 capsule  0  . ondansetron (ZOFRAN) 4 MG tablet Take 1 tablet (4 mg total) by mouth every 6 (six) hours as needed for nausea.  20 tablet  2    SURGICAL HISTORY:  Past Surgical History  Procedure Date  . Turbinate reduction   . Chest tube insertion 11/29/2012    Procedure: INSERTION PLEURAL DRAINAGE CATHETER;  Surgeon: [name], MD;  Location: [facility] OR;  Service: Thoracic;  Laterality: Right;    REVIEW OF SYSTEMS:  A comprehensive review of systems was negative except for: Constitutional: positive for fatigue and weight loss Respiratory: positive for dyspnea on exertion   PHYSICAL EXAMINATION: General appearance: alert, cooperative, fatigued and no distress Head: Normocephalic, without obvious abnormality, atraumatic Neck: no adenopathy Lymph nodes: Cervical, supraclavicular, and axillary nodes normal. Resp: clear to auscultation bilaterally Back: negative, symmetric, no curvature. ROM normal. No CVA tenderness. Cardio: regular rate and rhythm, S1, S2 normal, no murmur, click, rub or gallop GI: soft, non-tender; bowel sounds normal; no masses,  no organomegaly Extremities: extremities normal, atraumatic, no cyanosis or edema Neurologic: Alert and oriented X 3, normal strength and tone. Normal symmetric reflexes. Normal coordination and gait  ECOG PERFORMANCE STATUS: 1 - Symptomatic but completely ambulatory  Blood pressure 123/74, pulse 94, temperature 97.1 F (36.2 C), temperature source Oral, resp. rate 20, height 6\' 2"  (1.88 m), weight 153 lb 14.4 oz (69.809 kg).  LABORATORY DATA: Lab Results  Component Value Date   WBC 9.8 12/05/2012   HGB 14.7 12/05/2012   HCT 43.3 12/05/2012   MCV 96.7 12/05/2012   PLT 532* 12/05/2012      Chemistry      Component Value Date/Time   NA 144 12/05/2012 1348   NA 138 11/26/2012 0500   K 4.6  12/05/2012 1348   K 4.6 11/26/2012 0500   CL 97* 12/05/2012 1348   CL 101 11/26/2012 0500   CO2 32* 12/05/2012 1348   CO2 27 11/26/2012 0500   BUN 15.0 12/05/2012 1348   BUN 9 11/26/2012 0500   CREATININE 0.8 12/05/2012 1348   CREATININE 0.87 12/01/2012 0545      Component Value Date/Time   CALCIUM 9.3 12/05/2012 1348   CALCIUM 9.0 11/26/2012 0500   ALKPHOS 105 12/05/2012 1348   ALKPHOS 82 11/26/2012 0500   AST 13 12/05/2012 1348   AST 13 11/26/2012 0500   ALT 14 12/05/2012 1348   ALT 9 11/26/2012 0500   BILITOT 0.26 12/05/2012 1348   BILITOT 0.3 11/26/2012 0500       RADIOGRAPHIC STUDIES: Dg Chest 2 View  11/30/2012  *RADIOLOGY REPORT*  Clinical Data: Right-sided Pleurx catheter for malignant effusions. Short of breath.  CHEST - 2 VIEW  Comparison: 11/29/2012  Findings: Right-sided hydropneumothorax.  The degree of lung collapse is stable from the previous day's study.  Fluid on the right there are layers in the right lower hemithorax with an air- fluid level.  This difference is likely due to the more erect position on the current exam.  The  right lower hemithorax thorax catheter is stable.  There is some increased opacity in the right lung mostly in the lower lobe that is more collapsed consistent with atelectasis/edema.  The left lung is hyperexpanded with some changes of emphysema at the apex but otherwise clear.  No left pneumothorax.  IMPRESSION: Right-sided hydropneumothorax.  The amount of right lung collapse is similar to the previous day's study allowing for differences in patient positioning.   Original Report Authenticated By: Amie Portland, M.D.    Dg Chest 2 View  11/27/2012  *RADIOLOGY REPORT*  Clinical Data: Short of breath  CHEST - 2 VIEW  Comparison: 11/26/2012  Findings: Left lung remains hyperaerated with chronic changes at the left lung apex.  Large right pleural effusion is stable.  No pneumothorax.  Cardiac silhouette obscured. Areas of volume loss in the right upper  lung zone have improved.  IMPRESSION: Stable large right pleural effusion.  No pneumothorax. Improved volume loss in the aerated right upper lung zone.   Original Report Authenticated By: Jolaine Click, M.D.    Dg Chest 2 View  11/25/2012  *RADIOLOGY REPORT*  Clinical Data: Chest pain, shortness of breath  CHEST - 2 VIEW  Comparison: None.  Findings: Cardiomediastinal silhouette is unremarkable.  There is moderate to large right pleural effusion with right lower lobe atelectasis atelectasis or infiltrate.  Left lung is clear.  IMPRESSION: Moderate to large right pleural effusion with right lower lobe atelectasis or infiltrate.   Original Report Authenticated By: Natasha Mead, M.D.    Ct Head W Wo Contrast  11/30/2012  *RADIOLOGY REPORT*  Clinical Data: Lung cancer staging  CT HEAD WITHOUT AND WITH CONTRAST  Technique:  Contiguous axial images were obtained from the base of the skull through the vertex without and with intravenous contrast.  Contrast: OMNIPAQUE IOHEXOL 300 MG/ML  SOLN  Comparison: None.  Findings: Ventricle size is normal.  Negative for acute infarct. Small chronic infarct left internal capsule.  Negative for hemorrhage or mass.  No edema in the brain.  Postcontrast images reveal no enhancing lesions.  Calvarium is intact.  Sinuses are clear.  IMPRESSION: Chronic lacunar infarct left internal capsule.  Negative for metastatic disease.   Original Report Authenticated By: Janeece Riggers, M.D.    Ct Chest W Contrast  11/25/2012  *RADIOLOGY REPORT*  Clinical Data: Chest pain, shortness of breath  CT CHEST WITH CONTRAST  Technique:  Multidetector CT imaging of the chest was performed following the standard protocol during bolus administration of intravenous contrast.  Contrast: OMNIPAQUE IOHEXOL 300 MG/ML  SOLN  Comparison: Chest x-ray same day.  Findings: No destructive bony lesions are noted.  Central airways are patent.  Bilateral apical large paraseptal emphysematous bullae are noted.   There is a large right pleural effusion.  There is significant atelectasis in the right middle lobe partially right upper lobe and right lower lobe.  There is some pleural thickening in the right midlung posteriorly best seen in axial image 44.  Malignant pleural effusion cannot be excluded.  Correlation with fluid analysis and cytology is recommended.  No definite enhancing lung mass is noted.  There is some low density in the right lower lobe centrally measures about 2 cm.  Called to mass cannot be excluded.  Further evaluation with bronchoscopy or repeat CT examination after lung re-expansion is recommended to exclude a subtle mass.  A subcarinal lymph node measures 2.4 x 1.7 cm.  There is a right hilar lymph node measures 2.2 x 1.8  cm.  Pretracheal lymph node measures 2.3 x 1.9 cm.  There is a right anterior mediastinal lymph node measures 1.9 x 1.5 cm. A right paratracheal lymph node measures 1.6 x 1.4 cm.  Small pericardial effusion.  There is a right anterior diaphragmatic nodule or lymph node best seen in axial image 56 measures 1.1 cm. Pathologic adenopathy or metastatic disease cannot be excluded.  No central pulmonary embolus is noted.  Left lung is clear. Minimal atelectasis noted in the left lower lobe.  IMPRESSION:  1.  A large right pleural effusion is noted.  There is some pleural thickening in the right lung posteriorly.  Highly suspicious for malignant pleural effusion.  Correlation with fluid analysis and cytology is recommended.  There is atelectasis of the right lower lobe right middle lobe and partial atelectasis of the right upper lobe. 2.  There is mediastinal and right hilar adenopathy. 3.  No definite enhancing lung mass is noted.  There is some central low density within the right lower lobe.  A occult mass cannot be excluded. Correlation with bronchoscopy or repeat CT scan after lung re-expansion is recommended. 4.  A right anterior diaphragmatic nodule or lymph node measures 1.1 cm.  Metastatic adenopathy cannot be excluded.   Original Report Authenticated By: Natasha Mead, M.D.    Nm Bone Scan Whole Body  11/30/2012  *RADIOLOGY REPORT*  Clinical Data: Diagnosis of lung cancer.  NUCLEAR MEDICINE WHOLE BODY BONE SCINTIGRAPHY  Technique:  Whole body anterior and posterior images were obtained approximately 3 hours after intravenous injection of radiopharmaceutical.  Radiopharmaceutical: CURIE TC-MDP TECHNETIUM TC 22M MEDRONATE IV KIT  Comparison: None.  Findings: There are no areas of abnormal activity in the skeleton. The patient has a slight thoracolumbar scoliosis.  IMPRESSION: No evidence of metastatic disease to the skeleton.   Original Report Authenticated By: Francene Boyers, M.D.    Ct Abdomen Pelvis W Contrast  11/30/2012  *RADIOLOGY REPORT*  Clinical Data: Lung cancer  CT ABDOMEN AND PELVIS WITH CONTRAST  Technique:  Multidetector CT imaging of the abdomen and pelvis was performed following the standard protocol during bolus administration of intravenous contrast.  Contrast: OMNIPAQUE IOHEXOL 300 MG/ML  SOLN  Comparison: CT chest from 11/25/2012  Findings: Images which include the lower chest show a large right basilar hydropneumothorax with a small bore chest tube in place.  No focal abnormalities seen in the liver or spleen.  The liver measures 18.9 cm in cranial caudal length, enlarged.  The stomach, duodenum, pancreas, gallbladder, and adrenal glands have normal imaging features.  Kidneys are unremarkable.  No abdominal aortic aneurysm.  Lymph nodes are identified in the gastrohepatic ligament measuring up to 13 mm in short axis.  Some of these have central low attenuation raising concern for necrosis.  Imaging through the pelvis shows no free intraperitoneal fluid.  No pelvic sidewall lymphadenopathy.  Prostate gland is unremarkable. Bladder has normal imaging features.  Terminal ileum is normal. The appendix is normal.  Bone windows reveal no worrisome lytic or  sclerotic osseous lesions.  IMPRESSION: Hepatomegaly.  Lymph nodes are identified in the gastrohepatic ligament some of which appear to have central necrosis.  Although these lymph nodes are not markedly enlarged, the central low attenuation is concerning for metastatic involvement.  PET CT may provide additional characterization as clinically warranted.  Large basilar hydropneumothorax.   Original Report Authenticated By: Kennith Center, M.D.    Dg Chest Port 1 View  11/29/2012  *RADIOLOGY REPORT*  Clinical Data:  Status post right chest tube placement.  PORTABLE CHEST - 1 VIEW  Comparison: 11/27/2012.  Findings: Interval placement of a small caliber chest tube at the right lung base with evacuation of the previously demonstrated right pleural fluid.  There is an approximately 30% right pneumothorax.  No mediastinal shift.  Mild atelectasis in the right lung.  The left lung is hyperexpanded with stable minimally prominent interstitial markings.  Normal sized heart.  Minimal scoliosis.  IMPRESSION:  1.  Approximately 30% right pneumothorax without mediastinal shift following right chest tube placement. 2.  Mild changes of COPD. 3.  Mild right lung atelectasis.   Original Report Authenticated By: Beckie Salts, M.D.    Dg Chest Port 1 View  11/26/2012  *RADIOLOGY REPORT*  Clinical Data: Thoracentesis  PORTABLE CHEST - 1 VIEW  Comparison: Yesterday  Findings: No pneumothorax post right thoracentesis.  Right pleural effusion is improved but remains prominent. Left lung remains hyperaerated.  Shift of the mediastinum to the left has normalized and is now midline.  IMPRESSION: No pneumothorax post thoracentesis.   Original Report Authenticated By: Jolaine Click, M.D.     ASSESSMENT: This is a very pleasant 51 years old white male recently diagnosed with metastatic non-small cell lung cancer, adenocarcinoma with negative ALK gene translocation but the status of the EGFR mutation is still pending.  PLAN: I have a  lengthy discussion with the patient today about his disease stage, prognosis and treatment options. I recommended for the patient the following: 1) I will complete the staging workup by ordering a PET scan. 2) I would wait for the final results of the EGFR mutation status. If the patient has EGFR positive mutation, I would consider him for treatment with oral Tarceva or Afatinib. 3) if the EGFR mutation is negative, I will discuss with the patient systemic chemotherapy either with a clinical trial according to the protocol ECoG 5508 versus standard chemotherapy with carboplatin and Alimta. I discussed with the patient briefly the adverse effects of the chemotherapy including but not limited to alopecia, myelosuppression, nausea and vomiting, peripheral neuropathy, liver or renal dysfunction. The patient agreed to the current plan. He would come back for followup visit in one week for reevaluation and more detailed discussion of his treatment. He was advised to call me immediately if he has any concerning symptoms in the interval.  All questions were answered. The patient knows to call the clinic with any problems, questions or concerns. We can certainly see the patient much sooner if necessary.  I spent 30 minutes counseling the patient face to face. The total time spent in the appointment was 55 minutes.

## 2012-12-05 NOTE — Patient Instructions (Addendum)
Your recently diagnosed with stage IV non-small cell lung cancer. ALK gene translocation was negative but the EGFR mutation still pending. I will complete the staging workup by ordering a PET scan Followup in 1 week

## 2012-12-05 NOTE — Telephone Encounter (Signed)
gv and printed appt schedule to pt for Dec..Marland Kitchen

## 2012-12-05 NOTE — Progress Notes (Signed)
Checked in new pt with no ins.  Pt has applied for Medicaid and it's in a pending status.  I gave pt an EPP application and explained how the program works.  I will process application when he returns it with the needed info.

## 2012-12-06 ENCOUNTER — Encounter: Payer: Self-pay | Admitting: Internal Medicine

## 2012-12-06 NOTE — Progress Notes (Signed)
Rcvd pt's completed EPP application w/ 1 check stub.  I left pt a msg stating I need another check stub.  I indicated if he gets paid every week, I need 4 ck stubs, if he gets paid every other week, I need 2 ck stubs. Asked pt to return call.

## 2012-12-11 ENCOUNTER — Encounter (HOSPITAL_COMMUNITY): Payer: Self-pay

## 2012-12-11 ENCOUNTER — Encounter (HOSPITAL_COMMUNITY)
Admission: RE | Admit: 2012-12-11 | Discharge: 2012-12-11 | Disposition: A | Discharge status: Home / Self Care | Payer: Medicaid Other | Source: Ambulatory Visit | Attending: Internal Medicine | Admitting: Internal Medicine

## 2012-12-11 DIAGNOSIS — C3490 Malignant neoplasm of unspecified part of unspecified bronchus or lung: Secondary | ICD-10-CM | POA: Insufficient documentation

## 2012-12-11 HISTORY — DX: Malignant neoplasm of unspecified part of unspecified bronchus or lung: C34.90

## 2012-12-11 LAB — GLUCOSE, CAPILLARY: Glucose-Capillary: 80 mg/dL (ref 70–99)

## 2012-12-11 MED ORDER — FLUDEOXYGLUCOSE F - 18 (FDG) INJECTION
19.6000 | Freq: Once | INTRAVENOUS | Status: AC | PRN
  Administered 2012-12-11: 19.6 via INTRAVENOUS

## 2012-12-13 ENCOUNTER — Ambulatory Visit (HOSPITAL_BASED_OUTPATIENT_CLINIC_OR_DEPARTMENT_OTHER): Payer: Self-pay | Admitting: Internal Medicine

## 2012-12-13 ENCOUNTER — Ambulatory Visit (HOSPITAL_BASED_OUTPATIENT_CLINIC_OR_DEPARTMENT_OTHER): Payer: Self-pay | Admitting: Lab

## 2012-12-13 ENCOUNTER — Encounter: Payer: Self-pay | Admitting: Internal Medicine

## 2012-12-13 ENCOUNTER — Other Ambulatory Visit: Payer: Self-pay | Admitting: *Deleted

## 2012-12-13 ENCOUNTER — Encounter: Payer: Self-pay | Admitting: *Deleted

## 2012-12-13 ENCOUNTER — Telehealth: Payer: Self-pay | Admitting: *Deleted

## 2012-12-13 ENCOUNTER — Telehealth: Payer: Self-pay | Admitting: Internal Medicine

## 2012-12-13 VITALS — BP 120/79 | HR 92 | Temp 97.4°F | Resp 18 | Ht 74.0 in | Wt 152.0 lb

## 2012-12-13 DIAGNOSIS — C3490 Malignant neoplasm of unspecified part of unspecified bronchus or lung: Secondary | ICD-10-CM

## 2012-12-13 DIAGNOSIS — C778 Secondary and unspecified malignant neoplasm of lymph nodes of multiple regions: Secondary | ICD-10-CM

## 2012-12-13 DIAGNOSIS — Z5111 Encounter for antineoplastic chemotherapy: Secondary | ICD-10-CM

## 2012-12-13 DIAGNOSIS — J90 Pleural effusion, not elsewhere classified: Secondary | ICD-10-CM

## 2012-12-13 DIAGNOSIS — C3430 Malignant neoplasm of lower lobe, unspecified bronchus or lung: Secondary | ICD-10-CM

## 2012-12-13 DIAGNOSIS — R630 Anorexia: Secondary | ICD-10-CM

## 2012-12-13 LAB — URINALYSIS, MICROSCOPIC - CHCC
Ketones: NEGATIVE mg/dL
Protein: NEGATIVE mg/dL
Specific Gravity, Urine: 1.01 (ref 1.003–1.035)
pH: 6 (ref 4.6–8.0)

## 2012-12-13 LAB — LACTATE DEHYDROGENASE (CC13): LDH: 148 U/L (ref 125–245)

## 2012-12-13 MED ORDER — METHYLPREDNISOLONE 4 MG PO KIT: PACK | ORAL | Status: DC

## 2012-12-13 NOTE — Progress Notes (Signed)
Pt is approved for 100% financial assistance effective 12/13/12 - 06/13/13.  I gave pt his approval letter and green card today at his appointment.

## 2012-12-13 NOTE — Progress Notes (Signed)
[facility] [phone]   [phone]  OFFICE PROGRESS NOTE  DIAGNOSIS: Metastatic non-small cell lung cancer, adenocarcinoma with negative EGFR mutation and negative ALK gene translocation diagnosed in December of 2013.    PRIOR THERAPY: Status post Pleurx catheter placement for drainage of right-sided pleural effusion under the care of Dr. Laneta Simmers on 11/29/2012.   CURRENT THERAPY: The patient expected to start the first cycle of systemic chemotherapy with carboplatin for AUC of 6, paclitaxel 200 mg/M2 and Avastin 15 mg/kg every 3 weeks on 12/24/2012 according to the ECoG protocol 5508.  INTERVAL HISTORY: Jay Watts 51 y.o. male returns to the clinic today for followup visit. The patient is feeling fine today except for lack of appetite and mild fatigue. He still have some pain on the right side of his chest. He denied having any significant shortness of breath cough or hemoptysis. He denied having any recent weight loss or night sweats. The patient had a PET scan performed recently and he is here today for evaluation and discussion of his scan results as well as recommendation regarding treatment of his condition. The molecular study for EGFR mutation was negative.  MEDICAL HISTORY: Past Medical History  Diagnosis Date  . Bipolar 2 disorder   . Shortness of breath     with exertion  . Pneumothorax     right side- spring 2013  . Lung cancer     ALLERGIES:   has no known allergies.  MEDICATIONS:  Current Outpatient Prescriptions  Medication Sig Dispense Refill  . lamoTRIgine (LAMICTAL) 150 MG tablet Take 150 mg by mouth at bedtime.      . mirtazapine (REMERON) 45 MG tablet Take 45 mg by mouth at bedtime.      . hydrocodone-acetaminophen (LORCET-HD) 5-500 MG per capsule Take 1 capsule by mouth every 6 (six) hours as needed for pain.  30 capsule  0  . naproxen sodium (ANAPROX) 220 MG tablet Take 220 mg by mouth 2 (two) times daily with a  meal.      . ondansetron (ZOFRAN) 4 MG tablet Take 1 tablet (4 mg total) by mouth every 6 (six) hours as needed for nausea.  20 tablet  2    SURGICAL HISTORY:  Past Surgical History  Procedure Date  . Turbinate reduction   . Chest tube insertion 11/29/2012    Procedure: INSERTION PLEURAL DRAINAGE CATHETER;  Surgeon: [name], MD;  Location: [facility] OR;  Service: Thoracic;  Laterality: Right;    REVIEW OF SYSTEMS:  A comprehensive review of systems was negative except for: Constitutional: positive for anorexia and fatigue Respiratory: positive for pleurisy/chest pain   PHYSICAL EXAMINATION: General appearance: alert, cooperative, fatigued and no distress Head: Normocephalic, without obvious abnormality, atraumatic Neck: no adenopathy Lymph nodes: Cervical, supraclavicular, and axillary nodes normal. Resp: clear to auscultation bilaterally Cardio: regular rate and rhythm, S1, S2 normal, no murmur, click, rub or gallop GI: soft, non-tender; bowel sounds normal; no masses,  no organomegaly Extremities: extremities normal, atraumatic, no cyanosis or edema Neurologic: Alert and oriented X 3, normal strength and tone. Normal symmetric reflexes. Normal coordination and gait  ECOG PERFORMANCE STATUS: 1 - Symptomatic but completely ambulatory  Blood pressure 120/79, pulse 92, temperature 97.4 F (36.3 C), temperature source Oral, resp. rate 18, height 6\' 2"  (1.88 m), weight 152 lb (68.947 kg).  LABORATORY DATA: Lab Results  Component Value Date   WBC 9.8 12/05/2012   HGB 14.7 12/05/2012   HCT 43.3  12/05/2012   MCV 96.7 12/05/2012   PLT 532* 12/05/2012      Chemistry      Component Value Date/Time   NA 144 12/05/2012 1348   NA 138 11/26/2012 0500   K 4.6 12/05/2012 1348   K 4.6 11/26/2012 0500   CL 97* 12/05/2012 1348   CL 101 11/26/2012 0500   CO2 32* 12/05/2012 1348   CO2 27 11/26/2012 0500   BUN 15.0 12/05/2012 1348   BUN 9 11/26/2012 0500   CREATININE 0.8 12/05/2012 1348    CREATININE 0.87 12/01/2012 0545      Component Value Date/Time   CALCIUM 9.3 12/05/2012 1348   CALCIUM 9.0 11/26/2012 0500   ALKPHOS 105 12/05/2012 1348   ALKPHOS 82 11/26/2012 0500   AST 13 12/05/2012 1348   AST 13 11/26/2012 0500   ALT 14 12/05/2012 1348   ALT 9 11/26/2012 0500   BILITOT 0.26 12/05/2012 1348   BILITOT 0.3 11/26/2012 0500       RADIOGRAPHIC STUDIES: Dg Chest 2 View  11/30/2012  *RADIOLOGY REPORT*  Clinical Data: Right-sided Pleurx catheter for malignant effusions. Short of breath.  CHEST - 2 VIEW  Comparison: 11/29/2012  Findings: Right-sided hydropneumothorax.  The degree of lung collapse is stable from the previous day's study.  Fluid on the right there are layers in the right lower hemithorax with an air- fluid level.  This difference is likely due to the more erect position on the current exam.  The right lower hemithorax thorax catheter is stable.  There is some increased opacity in the right lung mostly in the lower lobe that is more collapsed consistent with atelectasis/edema.  The left lung is hyperexpanded with some changes of emphysema at the apex but otherwise clear.  No left pneumothorax.  IMPRESSION: Right-sided hydropneumothorax.  The amount of right lung collapse is similar to the previous day's study allowing for differences in patient positioning.   Original Report Authenticated By: Amie Portland, M.D.    Dg Chest 2 View  11/27/2012  *RADIOLOGY REPORT*  Clinical Data: Short of breath  CHEST - 2 VIEW  Comparison: 11/26/2012  Findings: Left lung remains hyperaerated with chronic changes at the left lung apex.  Large right pleural effusion is stable.  No pneumothorax.  Cardiac silhouette obscured. Areas of volume loss in the right upper lung zone have improved.  IMPRESSION: Stable large right pleural effusion.  No pneumothorax. Improved volume loss in the aerated right upper lung zone.   Original Report Authenticated By: Jolaine Click, M.D.    Dg Chest 2  View  11/25/2012  *RADIOLOGY REPORT*  Clinical Data: Chest pain, shortness of breath  CHEST - 2 VIEW  Comparison: None.  Findings: Cardiomediastinal silhouette is unremarkable.  There is moderate to large right pleural effusion with right lower lobe atelectasis atelectasis or infiltrate.  Left lung is clear.  IMPRESSION: Moderate to large right pleural effusion with right lower lobe atelectasis or infiltrate.   Original Report Authenticated By: Natasha Mead, M.D.    Ct Head W Wo Contrast  11/30/2012  *RADIOLOGY REPORT*  Clinical Data: Lung cancer staging  CT HEAD WITHOUT AND WITH CONTRAST  Technique:  Contiguous axial images were obtained from the base of the skull through the vertex without and with intravenous contrast.  Contrast: OMNIPAQUE IOHEXOL 300 MG/ML  SOLN  Comparison: None.  Findings: Ventricle size is normal.  Negative for acute infarct. Small chronic infarct left internal capsule.  Negative for hemorrhage or mass.  No edema  in the brain.  Postcontrast images reveal no enhancing lesions.  Calvarium is intact.  Sinuses are clear.  IMPRESSION: Chronic lacunar infarct left internal capsule.  Negative for metastatic disease.   Original Report Authenticated By: Janeece Riggers, M.D.    Ct Chest W Contrast  11/25/2012  *RADIOLOGY REPORT*  Clinical Data: Chest pain, shortness of breath  CT CHEST WITH CONTRAST  Technique:  Multidetector CT imaging of the chest was performed following the standard protocol during bolus administration of intravenous contrast.  Contrast: OMNIPAQUE IOHEXOL 300 MG/ML  SOLN  Comparison: Chest x-ray same day.  Findings: No destructive bony lesions are noted.  Central airways are patent.  Bilateral apical large paraseptal emphysematous bullae are noted.  There is a large right pleural effusion.  There is significant atelectasis in the right middle lobe partially right upper lobe and right lower lobe.  There is some pleural thickening in the right midlung posteriorly best  seen in axial image 44.  Malignant pleural effusion cannot be excluded.  Correlation with fluid analysis and cytology is recommended.  No definite enhancing lung mass is noted.  There is some low density in the right lower lobe centrally measures about 2 cm.  Called to mass cannot be excluded.  Further evaluation with bronchoscopy or repeat CT examination after lung re-expansion is recommended to exclude a subtle mass.  A subcarinal lymph node measures 2.4 x 1.7 cm.  There is a right hilar lymph node measures 2.2 x 1.8 cm.  Pretracheal lymph node measures 2.3 x 1.9 cm.  There is a right anterior mediastinal lymph node measures 1.9 x 1.5 cm. A right paratracheal lymph node measures 1.6 x 1.4 cm.  Small pericardial effusion.  There is a right anterior diaphragmatic nodule or lymph node best seen in axial image 56 measures 1.1 cm. Pathologic adenopathy or metastatic disease cannot be excluded.  No central pulmonary embolus is noted.  Left lung is clear. Minimal atelectasis noted in the left lower lobe.  IMPRESSION:  1.  A large right pleural effusion is noted.  There is some pleural thickening in the right lung posteriorly.  Highly suspicious for malignant pleural effusion.  Correlation with fluid analysis and cytology is recommended.  There is atelectasis of the right lower lobe right middle lobe and partial atelectasis of the right upper lobe. 2.  There is mediastinal and right hilar adenopathy. 3.  No definite enhancing lung mass is noted.  There is some central low density within the right lower lobe.  A occult mass cannot be excluded. Correlation with bronchoscopy or repeat CT scan after lung re-expansion is recommended. 4.  A right anterior diaphragmatic nodule or lymph node measures 1.1 cm. Metastatic adenopathy cannot be excluded.   Original Report Authenticated By: Natasha Mead, M.D.    Nm Bone Scan Whole Body  11/30/2012  *RADIOLOGY REPORT*  Clinical Data: Diagnosis of lung cancer.  NUCLEAR MEDICINE WHOLE  BODY BONE SCINTIGRAPHY  Technique:  Whole body anterior and posterior images were obtained approximately 3 hours after intravenous injection of radiopharmaceutical.  Radiopharmaceutical: CURIE TC-MDP TECHNETIUM TC 59M MEDRONATE IV KIT  Comparison: None.  Findings: There are no areas of abnormal activity in the skeleton. The patient has a slight thoracolumbar scoliosis.  IMPRESSION: No evidence of metastatic disease to the skeleton.   Original Report Authenticated By: Francene Boyers, M.D.    Ct Abdomen Pelvis W Contrast  11/30/2012  *RADIOLOGY REPORT*  Clinical Data: Lung cancer  CT ABDOMEN AND PELVIS WITH CONTRAST  Technique:  Multidetector CT imaging of the abdomen and pelvis was performed following the standard protocol during bolus administration of intravenous contrast.  Contrast: OMNIPAQUE IOHEXOL 300 MG/ML  SOLN  Comparison: CT chest from 11/25/2012  Findings: Images which include the lower chest show a large right basilar hydropneumothorax with a small bore chest tube in place.  No focal abnormalities seen in the liver or spleen.  The liver measures 18.9 cm in cranial caudal length, enlarged.  The stomach, duodenum, pancreas, gallbladder, and adrenal glands have normal imaging features.  Kidneys are unremarkable.  No abdominal aortic aneurysm.  Lymph nodes are identified in the gastrohepatic ligament measuring up to 13 mm in short axis.  Some of these have central low attenuation raising concern for necrosis.  Imaging through the pelvis shows no free intraperitoneal fluid.  No pelvic sidewall lymphadenopathy.  Prostate gland is unremarkable. Bladder has normal imaging features.  Terminal ileum is normal. The appendix is normal.  Bone windows reveal no worrisome lytic or sclerotic osseous lesions.  IMPRESSION: Hepatomegaly.  Lymph nodes are identified in the gastrohepatic ligament some of which appear to have central necrosis.  Although these lymph nodes are not markedly enlarged, the central  low attenuation is concerning for metastatic involvement.  PET CT may provide additional characterization as clinically warranted.  Large basilar hydropneumothorax.   Original Report Authenticated By: Kennith Center, M.D.    Nm Pet Image Initial (pi) Skull Base To Thigh  12/11/2012  *RADIOLOGY REPORT*  Clinical Data: Initial treatment strategy for lung cancer.  NUCLEAR MEDICINE PET SKULL BASE TO THIGH  Fasting Blood Glucose:  80  Technique:  19.6 mCi F-18 FDG was injected intravenously. CT data was obtained and used for attenuation correction and anatomic localization only.  (This was not acquired as a diagnostic CT examination.) Additional exam technical data entered on technologist worksheet.  Comparison:  Chest CT 11/25/2012, abdominal CT and bone scan 11/30/2012.  Findings:  Neck: There are no hypermetabolic lymph nodes in the upper neck. There are small right supraclavicular nodes which are mildly hypermetabolic.  Chest:  There is extensive nodular hypermetabolic activity throughout the right pleural space consistent with extensive pleural tumor.  This pleural activity has an SUV max of 10.6 laterally at the apex and 8.1 medially at the right base.  The right pleural effusion has decreased in volume status post pleural catheter placement.  There is abnormal activity anteriorly within the right chest wall, likely secondary to metastases within right subpectoral and internal mammary lymph nodes. There is multifocal hypermetabolic right hilar and mediastinal lymphadenopathy.  A right paratracheal node measuring 1.4 cm on image 69 has an SUV max of 16.6.  A 2.3 x 2.6 cm precarinal nodal mass on image 89 has an SUV max of 17.7.  A 1.6 x 2.9 cm subcarinal node on image 101 has an SUV max of 17.7. There is a subpleural right lower lobe mass which demonstrates an SUV max of 4.8.  No other abnormal pulmonary activity is identified.  Abdomen/Pelvis:  No abnormal activity is seen within the liver, adrenal glands, spleen  or pancreas.  However, there is a hypermetabolic lymphadenopathy in the upper abdomen with a 1.4 x 1.8 cm lymph node in the gastrohepatic ligament having an SUV max of 14.3.  There is an additional hypermetabolic retrocaval node at the level of the renal vessels.  No lower abdominal or pelvic hypermetabolic lymph nodes are identified.  Skeleton:  No focal hypermetabolic activity to suggest skeletal metastasis.  IMPRESSION:  1.  Extensive metastatic disease in the right hemithorax with multi focal pleural involvement, hypermetabolic right hilar, mediastinal and chest wall lymph nodes.  A hypermetabolic right lower lobe lesion may reflect the primary malignancy. 2.  Hypermetabolic lymph nodes in the upper abdomen consistent with metastatic disease. 3.  No evidence metastatic disease to the liver, adrenal glands or bones.   Original Report Authenticated By: Carey Bullocks, M.D.    Dg Chest Port 1 View  11/29/2012  *RADIOLOGY REPORT*  Clinical Data: Status post right chest tube placement.  PORTABLE CHEST - 1 VIEW  Comparison: 11/27/2012.  Findings: Interval placement of a small caliber chest tube at the right lung base with evacuation of the previously demonstrated right pleural fluid.  There is an approximately 30% right pneumothorax.  No mediastinal shift.  Mild atelectasis in the right lung.  The left lung is hyperexpanded with stable minimally prominent interstitial markings.  Normal sized heart.  Minimal scoliosis.  IMPRESSION:  1.  Approximately 30% right pneumothorax without mediastinal shift following right chest tube placement. 2.  Mild changes of COPD. 3.  Mild right lung atelectasis.   Original Report Authenticated By: Beckie Salts, M.D.    Dg Chest Port 1 View  11/26/2012  *RADIOLOGY REPORT*  Clinical Data: Thoracentesis  PORTABLE CHEST - 1 VIEW  Comparison: Yesterday  Findings: No pneumothorax post right thoracentesis.  Right pleural effusion is improved but remains prominent. Left lung remains  hyperaerated.  Shift of the mediastinum to the left has normalized and is now midline.  IMPRESSION: No pneumothorax post thoracentesis.   Original Report Authenticated By: Jolaine Click, M.D.     ASSESSMENT: This is a very pleasant 51 years old white male with metastatic non-small cell lung cancer, adenocarcinoma with negative EGFR mutation and negative ALK gene translocation.  PLAN: I have a lengthy discussion with the patient today about his disease stage, prognosis and treatment options. I showed them the images of the PET scan. In the absence of positive EGFR mutation and ALK gene translocation, I recommended for the patient systemic chemotherapy versus palliative care. The patient is interested in treatment. I discussed with him to options for treatment including enrollment in the clinical trial according to the ECoG protocol 5508 with the patient would be treated with carboplatin for AUC of 6, paclitaxel 200 mg/M2 and Avastin 15 mg/kg every 3 weeks followed by maintenance therapy according to the clinical trial versus standard chemotherapy with carboplatin and Alimta. I discussed with the patient adverse effect of this treatment including but not limited to alopecia, myelosuppression, nausea and vomiting, peripheral neuropathy, liver or renal dysfunction. The patient would like to proceed with the treatment as planned and he is interested in considering the clinical trial. He will be seen by the clinical trial research nurse later today. I expect the patient to start his chemotherapy within the next 7-10 days. He would have a chemotherapy education class given before the first cycle. For the lack of appetite I will call his pharmacy with Medrol Dosepak. The patient would come back for followup visit with the start of the first cycle of his chemotherapy. He was advised to call immediately if he has any concerning symptoms in the interval.  All questions were answered. The patient knows to call the  clinic with any problems, questions or concerns. We can certainly see the patient much sooner if necessary.  I spent 15 minutes counseling the patient face to face. The total time spent in the appointment was  25 minutes.

## 2012-12-13 NOTE — Telephone Encounter (Signed)
Per staff message and POF I have scheduled appts.  JMW  

## 2012-12-13 NOTE — Telephone Encounter (Signed)
Gave pt appt for January 2014 lab, MD , chemo and chemo class

## 2012-12-13 NOTE — Patient Instructions (Signed)
EGFR mutation was negative. We discussed treatment options including enrollment in clinical trials versus standard chemotherapy. You elected to proceed with the clinical trial protocol. Followup in 2 weeks with the start of the first cycle of chemotherapy

## 2012-12-18 ENCOUNTER — Other Ambulatory Visit: Payer: Self-pay | Admitting: *Deleted

## 2012-12-18 DIAGNOSIS — C3490 Malignant neoplasm of unspecified part of unspecified bronchus or lung: Secondary | ICD-10-CM

## 2012-12-20 ENCOUNTER — Encounter: Payer: Self-pay | Admitting: Internal Medicine

## 2012-12-20 ENCOUNTER — Other Ambulatory Visit: Payer: Self-pay

## 2012-12-20 ENCOUNTER — Encounter: Payer: Self-pay | Admitting: *Deleted

## 2012-12-20 NOTE — Progress Notes (Signed)
Called and advised patient of assistance with Avastin he will be starting on Monday 12/24/12. I advised him of application has to be done prior to start of chemo and I would sign and would need for him to initial when he comes in on Monday. He is to see Lenise. He still has discount thru 05/2013.

## 2012-12-24 ENCOUNTER — Telehealth: Payer: Self-pay | Admitting: Internal Medicine

## 2012-12-24 ENCOUNTER — Ambulatory Visit (HOSPITAL_BASED_OUTPATIENT_CLINIC_OR_DEPARTMENT_OTHER): Payer: Self-pay | Admitting: Internal Medicine

## 2012-12-24 ENCOUNTER — Other Ambulatory Visit (HOSPITAL_BASED_OUTPATIENT_CLINIC_OR_DEPARTMENT_OTHER): Payer: Self-pay | Admitting: Lab

## 2012-12-24 ENCOUNTER — Ambulatory Visit (HOSPITAL_BASED_OUTPATIENT_CLINIC_OR_DEPARTMENT_OTHER): Payer: Self-pay

## 2012-12-24 ENCOUNTER — Encounter: Payer: Self-pay | Admitting: *Deleted

## 2012-12-24 ENCOUNTER — Ambulatory Visit: Payer: Self-pay | Admitting: Oncology

## 2012-12-24 ENCOUNTER — Encounter: Payer: Self-pay | Admitting: Internal Medicine

## 2012-12-24 VITALS — BP 115/75 | HR 96 | Temp 97.9°F | Resp 18

## 2012-12-24 VITALS — BP 123/75 | HR 95 | Temp 97.8°F | Resp 20 | Ht 74.0 in | Wt 155.2 lb

## 2012-12-24 DIAGNOSIS — C3430 Malignant neoplasm of lower lobe, unspecified bronchus or lung: Secondary | ICD-10-CM

## 2012-12-24 DIAGNOSIS — C3490 Malignant neoplasm of unspecified part of unspecified bronchus or lung: Secondary | ICD-10-CM

## 2012-12-24 DIAGNOSIS — Z5111 Encounter for antineoplastic chemotherapy: Secondary | ICD-10-CM

## 2012-12-24 DIAGNOSIS — C778 Secondary and unspecified malignant neoplasm of lymph nodes of multiple regions: Secondary | ICD-10-CM

## 2012-12-24 LAB — CBC WITH DIFFERENTIAL/PLATELET
BASO%: 0.8 % (ref 0.0–2.0)
Basophils Absolute: 0.1 10*3/uL (ref 0.0–0.1)
HCT: 40.5 % (ref 38.4–49.9)
HGB: 14.1 g/dL (ref 13.0–17.1)
MCHC: 34.9 g/dL (ref 32.0–36.0)
MONO#: 0.7 10*3/uL (ref 0.1–0.9)
NEUT%: 67.7 % (ref 39.0–75.0)
RDW: 13.1 % (ref 11.0–14.6)
WBC: 7 10*3/uL (ref 4.0–10.3)
lymph#: 1.4 10*3/uL (ref 0.9–3.3)

## 2012-12-24 LAB — COMPREHENSIVE METABOLIC PANEL (CC13)
ALT: 7 U/L (ref 0–55)
Albumin: 3 g/dL — ABNORMAL LOW (ref 3.5–5.0)
Alkaline Phosphatase: 94 U/L (ref 40–150)
Glucose: 87 mg/dl (ref 70–99)
Potassium: 4.5 mEq/L (ref 3.5–5.1)
Sodium: 141 mEq/L (ref 136–145)
Total Protein: 6.5 g/dL (ref 6.4–8.3)

## 2012-12-24 LAB — RESEARCH LABS

## 2012-12-24 LAB — MAGNESIUM (CC13): Magnesium: 2.2 mg/dl (ref 1.5–2.5)

## 2012-12-24 MED ORDER — ONDANSETRON 16 MG/50ML IVPB (CHCC)
16.0000 mg | Freq: Once | INTRAVENOUS | Status: AC
  Administered 2012-12-24: 16 mg via INTRAVENOUS

## 2012-12-24 MED ORDER — FAMOTIDINE IN NACL 20-0.9 MG/50ML-% IV SOLN
20.0000 mg | Freq: Once | INTRAVENOUS | Status: AC
Start: 2012-12-24 — End: 2012-12-24
  Administered 2012-12-24: 20 mg via INTRAVENOUS

## 2012-12-24 MED ORDER — PACLITAXEL CHEMO INJECTION 300 MG/50ML
200.0000 mg/m2 | Freq: Once | INTRAVENOUS | Status: AC
  Administered 2012-12-24: 378 mg via INTRAVENOUS
  Filled 2012-12-24: qty 63

## 2012-12-24 MED ORDER — DIPHENHYDRAMINE HCL 50 MG/ML IJ SOLN
50.0000 mg | Freq: Once | INTRAMUSCULAR | Status: AC
  Administered 2012-12-24: 50 mg via INTRAVENOUS

## 2012-12-24 MED ORDER — SODIUM CHLORIDE 0.9 % IV SOLN
15.0000 mg/kg | Freq: Once | INTRAVENOUS | Status: AC
  Administered 2012-12-24: 1025 mg via INTRAVENOUS
  Filled 2012-12-24: qty 41

## 2012-12-24 MED ORDER — SODIUM CHLORIDE 0.9 % IV SOLN
Freq: Once | INTRAVENOUS | Status: AC
  Administered 2012-12-24: 10:00:00 via INTRAVENOUS

## 2012-12-24 MED ORDER — DEXAMETHASONE SODIUM PHOSPHATE 10 MG/ML IJ SOLN
20.0000 mg | Freq: Once | INTRAMUSCULAR | Status: AC
  Administered 2012-12-24: 20 mg via INTRAVENOUS

## 2012-12-24 MED ORDER — SODIUM CHLORIDE 0.9 % IV SOLN
789.0000 mg | Freq: Once | INTRAVENOUS | Status: AC
  Administered 2012-12-24: 790 mg via INTRAVENOUS
  Filled 2012-12-24: qty 79

## 2012-12-24 NOTE — Patient Instructions (Signed)
Your labwork is good today. We'll proceed with chemotherapy today as scheduled.

## 2012-12-24 NOTE — Telephone Encounter (Signed)
appts made and printed for pt pt aware that chemo will follow and an email has been sent to mw         anne

## 2012-12-24 NOTE — Progress Notes (Signed)
12/24/2012, U9811, cycle 1 Jay Watts is in the cancer center today for lab work, physical exam, and cycle 1 on the E5508 clinical trial. He reports a good energy level, completed moving this week-end. Some SOB with exertion. He has gained a couple of pounds. He continues to drain the pleurex catheter.  Exam by Dr. Arbutus Ped. Lab results WNL for treatment. Research labs drawn.   Sign for infusion given to C. Hyacinth Meeker, RN.

## 2012-12-24 NOTE — Progress Notes (Signed)
[facility] [phone]   [phone]  OFFICE PROGRESS NOTE  DIAGNOSIS: Metastatic non-small cell lung cancer, adenocarcinoma with negative EGFR mutation and negative ALK gene translocation diagnosed in December of 2013.   PRIOR THERAPY: Status post Pleurx catheter placement for drainage of right-sided pleural effusion under the care of Dr. Laneta Simmers on 11/29/2012.   CURRENT THERAPY: The patient will start today the first cycle of Systemic chemotherapy with carboplatin for AUC of 6, paclitaxel 200 mg/M2 and Avastin 15 mg/kg every 3 weeks according to the ECoG protocol 5508.  INTERVAL HISTORY: Jay Watts 52 y.o. male returns to the clinic today for followup visit. The patient is feeling fine today with no specific complaints. He gained 3 pounds since his last visit. The patient denied having any significant chest pain, shortness breath, cough or hemoptysis. He denied having any night sweats, no nausea or vomiting. He has no fever or chills. He is here today to start the first cycle of his systemic chemotherapy. He gained around 400 mL of pleural fluid from the Pleurx catheter over the last 48 hours.  MEDICAL HISTORY: Past Medical History  Diagnosis Date  . Bipolar 2 disorder   . Shortness of breath     with exertion  . Pneumothorax     right side- spring 2013  . Lung cancer     ALLERGIES:   has no known allergies.  MEDICATIONS:  Current Outpatient Prescriptions  Medication Sig Dispense Refill  . lamoTRIgine (LAMICTAL) 150 MG tablet Take 150 mg by mouth at bedtime.      . mirtazapine (REMERON) 45 MG tablet Take 45 mg by mouth at bedtime.      . hydrocodone-acetaminophen (LORCET-HD) 5-500 MG per capsule Take 1 capsule by mouth every 6 (six) hours as needed for pain.  30 capsule  0  . methylPREDNISolone (MEDROL, PAK,) 4 MG tablet follow package directions  21 tablet  0  . naproxen sodium (ANAPROX) 220 MG tablet Take 220 mg by mouth 2 (two)  times daily with a meal.      . ondansetron (ZOFRAN) 4 MG tablet Take 1 tablet (4 mg total) by mouth every 6 (six) hours as needed for nausea.  20 tablet  2    SURGICAL HISTORY:  Past Surgical History  Procedure Date  . Turbinate reduction   . Chest tube insertion 11/29/2012    Procedure: INSERTION PLEURAL DRAINAGE CATHETER;  Surgeon: [name], MD;  Location: [facility] OR;  Service: Thoracic;  Laterality: Right;    REVIEW OF SYSTEMS:  A comprehensive review of systems was negative.   PHYSICAL EXAMINATION: General appearance: alert, cooperative and no distress Head: Normocephalic, without obvious abnormality, atraumatic Neck: no adenopathy Lymph nodes: Cervical, supraclavicular, and axillary nodes normal. Resp: clear to auscultation bilaterally Cardio: regular rate and rhythm, S1, S2 normal, no murmur, click, rub or gallop GI: soft, non-tender; bowel sounds normal; no masses,  no organomegaly Extremities: extremities normal, atraumatic, no cyanosis or edema Neurologic: Alert and oriented X 3, normal strength and tone. Normal symmetric reflexes. Normal coordination and gait  ECOG PERFORMANCE STATUS: 1 - Symptomatic but completely ambulatory  Blood pressure 123/75, pulse 95, temperature 97.8 F (36.6 C), temperature source Oral, resp. rate 20, height 6\' 2"  (1.88 m), weight 155 lb 3.2 oz (70.398 kg).  LABORATORY DATA: Lab Results  Component Value Date   WBC 7.0 12/24/2012   HGB 14.1 12/24/2012   HCT 40.5 12/24/2012   MCV 95.4 12/24/2012  PLT 435* 12/24/2012      Chemistry      Component Value Date/Time   NA 144 12/05/2012 1348   NA 138 11/26/2012 0500   K 4.6 12/05/2012 1348   K 4.6 11/26/2012 0500   CL 97* 12/05/2012 1348   CL 101 11/26/2012 0500   CO2 32* 12/05/2012 1348   CO2 27 11/26/2012 0500   BUN 15.0 12/05/2012 1348   BUN 9 11/26/2012 0500   CREATININE 0.8 12/05/2012 1348   CREATININE 0.87 12/01/2012 0545      Component Value Date/Time   CALCIUM 9.3 12/05/2012 1348    CALCIUM 9.0 11/26/2012 0500   ALKPHOS 105 12/05/2012 1348   ALKPHOS 82 11/26/2012 0500   AST 13 12/05/2012 1348   AST 13 11/26/2012 0500   ALT 14 12/05/2012 1348   ALT 9 11/26/2012 0500   BILITOT 0.26 12/05/2012 1348   BILITOT 0.3 11/26/2012 0500       RADIOGRAPHIC STUDIES: Dg Chest 2 View  11/30/2012  *RADIOLOGY REPORT*  Clinical Data: Right-sided Pleurx catheter for malignant effusions. Short of breath.  CHEST - 2 VIEW  Comparison: 11/29/2012  Findings: Right-sided hydropneumothorax.  The degree of lung collapse is stable from the previous day's study.  Fluid on the right there are layers in the right lower hemithorax with an air- fluid level.  This difference is likely due to the more erect position on the current exam.  The right lower hemithorax thorax catheter is stable.  There is some increased opacity in the right lung mostly in the lower lobe that is more collapsed consistent with atelectasis/edema.  The left lung is hyperexpanded with some changes of emphysema at the apex but otherwise clear.  No left pneumothorax.  IMPRESSION: Right-sided hydropneumothorax.  The amount of right lung collapse is similar to the previous day's study allowing for differences in patient positioning.   Original Report Authenticated By: Amie Portland, M.D.    Dg Chest 2 View  11/27/2012  *RADIOLOGY REPORT*  Clinical Data: Short of breath  CHEST - 2 VIEW  Comparison: 11/26/2012  Findings: Left lung remains hyperaerated with chronic changes at the left lung apex.  Large right pleural effusion is stable.  No pneumothorax.  Cardiac silhouette obscured. Areas of volume loss in the right upper lung zone have improved.  IMPRESSION: Stable large right pleural effusion.  No pneumothorax. Improved volume loss in the aerated right upper lung zone.   Original Report Authenticated By: Jolaine Click, M.D.    Dg Chest 2 View  11/25/2012  *RADIOLOGY REPORT*  Clinical Data: Chest pain, shortness of breath  CHEST - 2 VIEW   Comparison: None.  Findings: Cardiomediastinal silhouette is unremarkable.  There is moderate to large right pleural effusion with right lower lobe atelectasis atelectasis or infiltrate.  Left lung is clear.  IMPRESSION: Moderate to large right pleural effusion with right lower lobe atelectasis or infiltrate.   Original Report Authenticated By: Natasha Mead, M.D.    Ct Head W Wo Contrast  11/30/2012  *RADIOLOGY REPORT*  Clinical Data: Lung cancer staging  CT HEAD WITHOUT AND WITH CONTRAST  Technique:  Contiguous axial images were obtained from the base of the skull through the vertex without and with intravenous contrast.  Contrast: OMNIPAQUE IOHEXOL 300 MG/ML  SOLN  Comparison: None.  Findings: Ventricle size is normal.  Negative for acute infarct. Small chronic infarct left internal capsule.  Negative for hemorrhage or mass.  No edema in the brain.  Postcontrast images reveal no  enhancing lesions.  Calvarium is intact.  Sinuses are clear.  IMPRESSION: Chronic lacunar infarct left internal capsule.  Negative for metastatic disease.   Original Report Authenticated By: Janeece Riggers, M.D.    Ct Chest W Contrast  11/25/2012  *RADIOLOGY REPORT*  Clinical Data: Chest pain, shortness of breath  CT CHEST WITH CONTRAST  Technique:  Multidetector CT imaging of the chest was performed following the standard protocol during bolus administration of intravenous contrast.  Contrast: OMNIPAQUE IOHEXOL 300 MG/ML  SOLN  Comparison: Chest x-ray same day.  Findings: No destructive bony lesions are noted.  Central airways are patent.  Bilateral apical large paraseptal emphysematous bullae are noted.  There is a large right pleural effusion.  There is significant atelectasis in the right middle lobe partially right upper lobe and right lower lobe.  There is some pleural thickening in the right midlung posteriorly best seen in axial image 44.  Malignant pleural effusion cannot be excluded.  Correlation with fluid analysis  and cytology is recommended.  No definite enhancing lung mass is noted.  There is some low density in the right lower lobe centrally measures about 2 cm.  Called to mass cannot be excluded.  Further evaluation with bronchoscopy or repeat CT examination after lung re-expansion is recommended to exclude a subtle mass.  A subcarinal lymph node measures 2.4 x 1.7 cm.  There is a right hilar lymph node measures 2.2 x 1.8 cm.  Pretracheal lymph node measures 2.3 x 1.9 cm.  There is a right anterior mediastinal lymph node measures 1.9 x 1.5 cm. A right paratracheal lymph node measures 1.6 x 1.4 cm.  Small pericardial effusion.  There is a right anterior diaphragmatic nodule or lymph node best seen in axial image 56 measures 1.1 cm. Pathologic adenopathy or metastatic disease cannot be excluded.  No central pulmonary embolus is noted.  Left lung is clear. Minimal atelectasis noted in the left lower lobe.  IMPRESSION:  1.  A large right pleural effusion is noted.  There is some pleural thickening in the right lung posteriorly.  Highly suspicious for malignant pleural effusion.  Correlation with fluid analysis and cytology is recommended.  There is atelectasis of the right lower lobe right middle lobe and partial atelectasis of the right upper lobe. 2.  There is mediastinal and right hilar adenopathy. 3.  No definite enhancing lung mass is noted.  There is some central low density within the right lower lobe.  A occult mass cannot be excluded. Correlation with bronchoscopy or repeat CT scan after lung re-expansion is recommended. 4.  A right anterior diaphragmatic nodule or lymph node measures 1.1 cm. Metastatic adenopathy cannot be excluded.   Original Report Authenticated By: Natasha Mead, M.D.    Nm Bone Scan Whole Body  11/30/2012  *RADIOLOGY REPORT*  Clinical Data: Diagnosis of lung cancer.  NUCLEAR MEDICINE WHOLE BODY BONE SCINTIGRAPHY  Technique:  Whole body anterior and posterior images were obtained approximately 3  hours after intravenous injection of radiopharmaceutical.  Radiopharmaceutical: CURIE TC-MDP TECHNETIUM TC 63M MEDRONATE IV KIT  Comparison: None.  Findings: There are no areas of abnormal activity in the skeleton. The patient has a slight thoracolumbar scoliosis.  IMPRESSION: No evidence of metastatic disease to the skeleton.   Original Report Authenticated By: Francene Boyers, M.D.    Ct Abdomen Pelvis W Contrast  11/30/2012  *RADIOLOGY REPORT*  Clinical Data: Lung cancer  CT ABDOMEN AND PELVIS WITH CONTRAST  Technique:  Multidetector CT imaging of the  abdomen and pelvis was performed following the standard protocol during bolus administration of intravenous contrast.  Contrast: OMNIPAQUE IOHEXOL 300 MG/ML  SOLN  Comparison: CT chest from 11/25/2012  Findings: Images which include the lower chest show a large right basilar hydropneumothorax with a small bore chest tube in place.  No focal abnormalities seen in the liver or spleen.  The liver measures 18.9 cm in cranial caudal length, enlarged.  The stomach, duodenum, pancreas, gallbladder, and adrenal glands have normal imaging features.  Kidneys are unremarkable.  No abdominal aortic aneurysm.  Lymph nodes are identified in the gastrohepatic ligament measuring up to 13 mm in short axis.  Some of these have central low attenuation raising concern for necrosis.  Imaging through the pelvis shows no free intraperitoneal fluid.  No pelvic sidewall lymphadenopathy.  Prostate gland is unremarkable. Bladder has normal imaging features.  Terminal ileum is normal. The appendix is normal.  Bone windows reveal no worrisome lytic or sclerotic osseous lesions.  IMPRESSION: Hepatomegaly.  Lymph nodes are identified in the gastrohepatic ligament some of which appear to have central necrosis.  Although these lymph nodes are not markedly enlarged, the central low attenuation is concerning for metastatic involvement.  PET CT may provide additional characterization  as clinically warranted.  Large basilar hydropneumothorax.   Original Report Authenticated By: Kennith Center, M.D.    Nm Pet Image Initial (pi) Skull Base To Thigh  12/11/2012  *RADIOLOGY REPORT*  Clinical Data: Initial treatment strategy for lung cancer.  NUCLEAR MEDICINE PET SKULL BASE TO THIGH  Fasting Blood Glucose:  80  Technique:  19.6 mCi F-18 FDG was injected intravenously. CT data was obtained and used for attenuation correction and anatomic localization only.  (This was not acquired as a diagnostic CT examination.) Additional exam technical data entered on technologist worksheet.  Comparison:  Chest CT 11/25/2012, abdominal CT and bone scan 11/30/2012.  Findings:  Neck: There are no hypermetabolic lymph nodes in the upper neck. There are small right supraclavicular nodes which are mildly hypermetabolic.  Chest:  There is extensive nodular hypermetabolic activity throughout the right pleural space consistent with extensive pleural tumor.  This pleural activity has an SUV max of 10.6 laterally at the apex and 8.1 medially at the right base.  The right pleural effusion has decreased in volume status post pleural catheter placement.  There is abnormal activity anteriorly within the right chest wall, likely secondary to metastases within right subpectoral and internal mammary lymph nodes. There is multifocal hypermetabolic right hilar and mediastinal lymphadenopathy.  A right paratracheal node measuring 1.4 cm on image 69 has an SUV max of 16.6.  A 2.3 x 2.6 cm precarinal nodal mass on image 89 has an SUV max of 17.7.  A 1.6 x 2.9 cm subcarinal node on image 101 has an SUV max of 17.7. There is a subpleural right lower lobe mass which demonstrates an SUV max of 4.8.  No other abnormal pulmonary activity is identified.  Abdomen/Pelvis:  No abnormal activity is seen within the liver, adrenal glands, spleen or pancreas.  However, there is a hypermetabolic lymphadenopathy in the upper abdomen with a 1.4 x 1.8 cm  lymph node in the gastrohepatic ligament having an SUV max of 14.3.  There is an additional hypermetabolic retrocaval node at the level of the renal vessels.  No lower abdominal or pelvic hypermetabolic lymph nodes are identified.  Skeleton:  No focal hypermetabolic activity to suggest skeletal metastasis.  IMPRESSION:  1.  Extensive metastatic disease  in the right hemithorax with multi focal pleural involvement, hypermetabolic right hilar, mediastinal and chest wall lymph nodes.  A hypermetabolic right lower lobe lesion may reflect the primary malignancy. 2.  Hypermetabolic lymph nodes in the upper abdomen consistent with metastatic disease. 3.  No evidence metastatic disease to the liver, adrenal glands or bones.   Original Report Authenticated By: Carey Bullocks, M.D.    Dg Chest Port 1 View  11/29/2012  *RADIOLOGY REPORT*  Clinical Data: Status post right chest tube placement.  PORTABLE CHEST - 1 VIEW  Comparison: 11/27/2012.  Findings: Interval placement of a small caliber chest tube at the right lung base with evacuation of the previously demonstrated right pleural fluid.  There is an approximately 30% right pneumothorax.  No mediastinal shift.  Mild atelectasis in the right lung.  The left lung is hyperexpanded with stable minimally prominent interstitial markings.  Normal sized heart.  Minimal scoliosis.  IMPRESSION:  1.  Approximately 30% right pneumothorax without mediastinal shift following right chest tube placement. 2.  Mild changes of COPD. 3.  Mild right lung atelectasis.   Original Report Authenticated By: Beckie Salts, M.D.    Dg Chest Port 1 View  11/26/2012  *RADIOLOGY REPORT*  Clinical Data: Thoracentesis  PORTABLE CHEST - 1 VIEW  Comparison: Yesterday  Findings: No pneumothorax post right thoracentesis.  Right pleural effusion is improved but remains prominent. Left lung remains hyperaerated.  Shift of the mediastinum to the left has normalized and is now midline.  IMPRESSION: No  pneumothorax post thoracentesis.   Original Report Authenticated By: Jolaine Click, M.D.     ASSESSMENT: This is a very pleasant 52 years old white male recently diagnosed with metastatic non-small cell lung cancer, adenocarcinoma with negative EGFR mutation and negative ALK gene translocation.  PLAN: The patient is feeling fine today and he is ready to start the first cycle of his systemic chemotherapy with carboplatin, paclitaxel and Avastin according to the ECoG protocol 5508. We'll proceed with treatment as scheduled.  The patient would come back for followup visit in 3 weeks with the start of cycle #2.  He was advised to call immediately if he has any concerning symptoms in the interval.  All questions were answered. The patient knows to call the clinic with any problems, questions or concerns. We can certainly see the patient much sooner if necessary.  I spent 15 minutes counseling the patient face to face. The total time spent in the appointment was 25 minutes.

## 2012-12-24 NOTE — Patient Instructions (Addendum)
[facility] Discharge Instructions for Patients Receiving Chemotherapy  Today you received the following chemotherapy agents Taxol/Carboplatin/Avastin To help prevent nausea and vomiting after your treatment, we encourage you to take your nausea medication as prescribed.  If you develop nausea and vomiting that is not controlled by your nausea medication, call the clinic. If it is after clinic hours your family physician or the after hours number for the clinic or go to the Emergency Department.   BELOW ARE SYMPTOMS THAT SHOULD BE REPORTED IMMEDIATELY:  *FEVER GREATER THAN 100.5 F  *CHILLS WITH OR WITHOUT FEVER  NAUSEA AND VOMITING THAT IS NOT CONTROLLED WITH YOUR NAUSEA MEDICATION  *UNUSUAL SHORTNESS OF BREATH  *UNUSUAL BRUISING OR BLEEDING  TENDERNESS IN MOUTH AND THROAT WITH OR WITHOUT PRESENCE OF ULCERS  *URINARY PROBLEMS  *BOWEL PROBLEMS  UNUSUAL RASH Items with * indicate a potential emergency and should be followed up as soon as possible.  One of the nurses will contact you 24 hours after your treatment. Please let the nurse know about any problems that you may have experienced. Feel free to call the clinic you have any questions or concerns. The clinic phone number is [phone].   I have been informed and understand all the instructions given to me. I know to contact the clinic, my physician, or go to the Emergency Department if any problems should occur. I do not have any questions at this time, but understand that I may call the clinic during office hours   should I have any questions or need assistance in obtaining follow up care.    __________________________________________  _____________  __________ Signature of Patient or Authorized Representative            Date                   Time    __________________________________________ Nurse's Signature    Bevacizumab injection What is this medicine? BEVACIZUMAB (be va SIZ yoo mab) is a  chemotherapy drug. It targets a protein found in many cancer cell types, and halts cancer growth. This drug treats many cancers including non-small cell lung cancer, and colon or rectal cancer. It is usually given with other chemotherapy drugs. This medicine may be used for other purposes; ask your health care provider or pharmacist if you have questions. What should I tell my health care provider before I take this medicine? They need to know if you have any of these conditions: -blood clots -heart disease, including heart failure, heart attack, or chest pain (angina) -high blood pressure -infection (especially a virus infection such as chickenpox, cold sores, or herpes) -kidney disease -lung disease -prior chemotherapy with doxorubicin, daunorubicin, epirubicin, or other anthracycline type chemotherapy agents -recent or ongoing radiation therapy -recent surgery -stroke -an unusual or allergic reaction to bevacizumab, hamster proteins, mouse proteins, other medicines, foods, dyes, or preservatives -pregnant or trying to get pregnant -breast-feeding How should I use this medicine? This medicine is for infusion into a vein. It is given by a health care professional in a hospital or clinic setting. Talk to your pediatrician regarding the use of this medicine in children. Special care may be needed. Overdosage: If you think you have taken too much of this medicine contact a poison control center or emergency room at once. NOTE: This medicine is only for you. Do not share this medicine with others. What if I miss a dose? It is important not to miss your dose. Call your doctor or health  care professional if you are unable to keep an appointment. What may interact with this medicine? Interactions are not expected. This list may not describe all possible interactions. Give your health care provider a list of all the medicines, herbs, non-prescription drugs, or dietary supplements you use. Also  tell them if you smoke, drink alcohol, or use illegal drugs. Some items may interact with your medicine. What should I watch for while using this medicine? Your condition will be monitored carefully while you are receiving this medicine. You will need important blood work and urine testing done while you are taking this medicine. During your treatment, let your health care professional know if you have any unusual symptoms, such as difficulty breathing. This medicine may rarely cause 'gastrointestinal perforation' (holes in the stomach, intestines or colon), a serious side effect requiring surgery to repair. This medicine should be started at least 28 days following major surgery and the site of the surgery should be totally healed. Check with your doctor before scheduling dental work or surgery while you are receiving this treatment. Talk to your doctor if you have recently had surgery or if you have a wound that has not healed. Do not become pregnant while taking this medicine. Women should inform their doctor if they wish to become pregnant or think they might be pregnant. There is a potential for serious side effects to an unborn child. Talk to your health care professional or pharmacist for more information. Do not breast-feed an infant while taking this medicine. This medicine has caused ovarian failure in some women. This medicine may interfere with the ability to have a child. You should talk to your doctor or health care professional if you are concerned about your fertility. What side effects may I notice from receiving this medicine? Side effects that you should report to your doctor or health care professional as soon as possible: -allergic reactions like skin rash, itching or hives, swelling of the face, lips, or tongue -signs of infection - fever or chills, cough, sore throat, pain or trouble passing urine -signs of decreased platelets or bleeding - bruising, pinpoint red spots on the skin,  black, tarry stools, nosebleeds, blood in the urine -breathing problems -changes in vision -chest pain -confusion -jaw pain, especially after dental work -mouth sores -seizures -severe abdominal pain -severe headache -sudden numbness or weakness of the face, arm or leg -swelling of legs or ankles -symptoms of a stroke: change in mental awareness, inability to talk or move one side of the body (especially in patients with lung cancer) -trouble passing urine or change in the amount of urine -trouble speaking or understanding -trouble walking, dizziness, loss of balance or coordination Side effects that usually do not require medical attention (report to your doctor or health care professional if they continue or are bothersome): -constipation -diarrhea -dry skin -headache -loss of appetite -nausea, vomiting This list may not describe all possible side effects. Call your doctor for medical advice about side effects. You may report side effects to FDA at 1-800-FDA-1088. Where should I keep my medicine? This drug is given in a hospital or clinic and will not be stored at home. NOTE: This sheet is a summary. It may not cover all possible information. If you have questions about this medicine, talk to your doctor, pharmacist, or health care provider.  2013, [employer]. (11/05/2010 4:25:37 PM)    Paclitaxel injection What is this medicine? PACLITAXEL (PAK li TAX el) is a chemotherapy drug. It targets fast dividing  cells, like cancer cells, and causes these cells to die. This medicine is used to treat ovarian cancer, breast cancer, and other cancers. This medicine may be used for other purposes; ask your health care provider or pharmacist if you have questions. What should I tell my health care provider before I take this medicine? They need to know if you have any of these conditions: -blood disorders -irregular heartbeat -infection (especially a virus infection such as  chickenpox, cold sores, or herpes) -liver disease -previous or ongoing radiation therapy -an unusual or allergic reaction to paclitaxel, alcohol, polyoxyethylated castor oil, other chemotherapy agents, other medicines, foods, dyes, or preservatives -pregnant or trying to get pregnant -breast-feeding How should I use this medicine? This drug is given as an infusion into a vein. It is administered in a hospital or clinic by a specially trained health care professional. Talk to your pediatrician regarding the use of this medicine in children. Special care may be needed. Overdosage: If you think you have taken too much of this medicine contact a poison control center or emergency room at once. NOTE: This medicine is only for you. Do not share this medicine with others. What if I miss a dose? It is important not to miss your dose. Call your doctor or health care professional if you are unable to keep an appointment. What may interact with this medicine? Do not take this medicine with any of the following medications: -disulfiram -metronidazole This medicine may also interact with the following medications: -cyclosporine -dexamethasone -diazepam -ketoconazole -medicines to increase blood counts like filgrastim, pegfilgrastim, sargramostim -other chemotherapy drugs like cisplatin, doxorubicin, epirubicin, etoposide, teniposide, vincristine -quinidine -testosterone -vaccines -verapamil Talk to your doctor or health care professional before taking any of these medicines: -acetaminophen -aspirin -ibuprofen -ketoprofen -naproxen This list may not describe all possible interactions. Give your health care provider a list of all the medicines, herbs, non-prescription drugs, or dietary supplements you use. Also tell them if you smoke, drink alcohol, or use illegal drugs. Some items may interact with your medicine. What should I watch for while using this medicine? Your condition will be  monitored carefully while you are receiving this medicine. You will need important blood work done while you are taking this medicine. This drug may make you feel generally unwell. This is not uncommon, as chemotherapy can affect healthy cells as well as cancer cells. Report any side effects. Continue your course of treatment even though you feel ill unless your doctor tells you to stop. In some cases, you may be given additional medicines to help with side effects. Follow all directions for their use. Call your doctor or health care professional for advice if you get a fever, chills or sore throat, or other symptoms of a cold or flu. Do not treat yourself. This drug decreases your body's ability to fight infections. Try to avoid being around people who are sick. This medicine may increase your risk to bruise or bleed. Call your doctor or health care professional if you notice any unusual bleeding. Be careful brushing and flossing your teeth or using a toothpick because you may get an infection or bleed more easily. If you have any dental work done, tell your dentist you are receiving this medicine. Avoid taking products that contain aspirin, acetaminophen, ibuprofen, naproxen, or ketoprofen unless instructed by your doctor. These medicines may hide a fever. Do not become pregnant while taking this medicine. Women should inform their doctor if they wish to become pregnant or think they  might be pregnant. There is a potential for serious side effects to an unborn child. Talk to your health care professional or pharmacist for more information. Do not breast-feed an infant while taking this medicine. Men are advised not to father a child while receiving this medicine. What side effects may I notice from receiving this medicine? Side effects that you should report to your doctor or health care professional as soon as possible: -allergic reactions like skin rash, itching or hives, swelling of the face, lips,  or tongue -low blood counts - This drug may decrease the number of white blood cells, red blood cells and platelets. You may be at increased risk for infections and bleeding. -signs of infection - fever or chills, cough, sore throat, pain or difficulty passing urine -signs of decreased platelets or bleeding - bruising, pinpoint red spots on the skin, black, tarry stools, nosebleeds -signs of decreased red blood cells - unusually weak or tired, fainting spells, lightheadedness -breathing problems -chest pain -high or low blood pressure -mouth sores -nausea and vomiting -pain, swelling, redness or irritation at the injection site -pain, tingling, numbness in the hands or feet -slow or irregular heartbeat -swelling of the ankle, feet, hands Side effects that usually do not require medical attention (report to your doctor or health care professional if they continue or are bothersome): -bone pain -complete hair loss including hair on your head, underarms, pubic hair, eyebrows, and eyelashes -changes in the color of fingernails -diarrhea -loosening of the fingernails -loss of appetite -muscle or joint pain -red flush to skin -sweating This list may not describe all possible side effects. Call your doctor for medical advice about side effects. You may report side effects to FDA at 1-800-FDA-1088. Where should I keep my medicine? This drug is given in a hospital or clinic and will not be stored at home. NOTE: This sheet is a summary. It may not cover all possible information. If you have questions about this medicine, talk to your doctor, pharmacist, or health care provider.  2013, [employer] Standard. (11/17/2008 11:54:26 AM)   Carboplatin injection What is this medicine? CARBOPLATIN (KAR boe pla tin) is a chemotherapy drug. It targets fast dividing cells, like cancer cells, and causes these cells to die. This medicine is used to treat ovarian cancer and many other cancers. This  medicine may be used for other purposes; ask your health care provider or pharmacist if you have questions. What should I tell my health care provider before I take this medicine? They need to know if you have any of these conditions: -blood disorders -hearing problems -kidney disease -recent or ongoing radiation therapy -an unusual or allergic reaction to carboplatin, cisplatin, other chemotherapy, other medicines, foods, dyes, or preservatives -pregnant or trying to get pregnant -breast-feeding How should I use this medicine? This drug is usually given as an infusion into a vein. It is administered in a hospital or clinic by a specially trained health care professional. Talk to your pediatrician regarding the use of this medicine in children. Special care may be needed. Overdosage: If you think you have taken too much of this medicine contact a poison control center or emergency room at once. NOTE: This medicine is only for you. Do not share this medicine with others. What if I miss a dose? It is important not to miss a dose. Call your doctor or health care professional if you are unable to keep an appointment. What may interact with this medicine? -medicines for seizures -medicines to  increase blood counts like filgrastim, pegfilgrastim, sargramostim -some antibiotics like amikacin, gentamicin, neomycin, streptomycin, tobramycin -vaccines Talk to your doctor or health care professional before taking any of these medicines: -acetaminophen -aspirin -ibuprofen -ketoprofen -naproxen This list may not describe all possible interactions. Give your health care provider a list of all the medicines, herbs, non-prescription drugs, or dietary supplements you use. Also tell them if you smoke, drink alcohol, or use illegal drugs. Some items may interact with your medicine. What should I watch for while using this medicine? Your condition will be monitored carefully while you are receiving this  medicine. You will need important blood work done while you are taking this medicine. This drug may make you feel generally unwell. This is not uncommon, as chemotherapy can affect healthy cells as well as cancer cells. Report any side effects. Continue your course of treatment even though you feel ill unless your doctor tells you to stop. In some cases, you may be given additional medicines to help with side effects. Follow all directions for their use. Call your doctor or health care professional for advice if you get a fever, chills or sore throat, or other symptoms of a cold or flu. Do not treat yourself. This drug decreases your body's ability to fight infections. Try to avoid being around people who are sick. This medicine may increase your risk to bruise or bleed. Call your doctor or health care professional if you notice any unusual bleeding. Be careful brushing and flossing your teeth or using a toothpick because you may get an infection or bleed more easily. If you have any dental work done, tell your dentist you are receiving this medicine. Avoid taking products that contain aspirin, acetaminophen, ibuprofen, naproxen, or ketoprofen unless instructed by your doctor. These medicines may hide a fever. Do not become pregnant while taking this medicine. Women should inform their doctor if they wish to become pregnant or think they might be pregnant. There is a potential for serious side effects to an unborn child. Talk to your health care professional or pharmacist for more information. Do not breast-feed an infant while taking this medicine. What side effects may I notice from receiving this medicine? Side effects that you should report to your doctor or health care professional as soon as possible: -allergic reactions like skin rash, itching or hives, swelling of the face, lips, or tongue -signs of infection - fever or chills, cough, sore throat, pain or difficulty passing urine -signs of  decreased platelets or bleeding - bruising, pinpoint red spots on the skin, black, tarry stools, nosebleeds -signs of decreased red blood cells - unusually weak or tired, fainting spells, lightheadedness -breathing problems -changes in hearing -changes in vision -chest pain -high blood pressure -low blood counts - This drug may decrease the number of white blood cells, red blood cells and platelets. You may be at increased risk for infections and bleeding. -nausea and vomiting -pain, swelling, redness or irritation at the injection site -pain, tingling, numbness in the hands or feet -problems with balance, talking, walking -trouble passing urine or change in the amount of urine Side effects that usually do not require medical attention (report to your doctor or health care professional if they continue or are bothersome): -hair loss -loss of appetite -metallic taste in the mouth or changes in taste This list may not describe all possible side effects. Call your doctor for medical advice about side effects. You may report side effects to FDA at 1-800-FDA-1088. Where should I  keep my medicine? This drug is given in a hospital or clinic and will not be stored at home. NOTE: This sheet is a summary. It may not cover all possible information. If you have questions about this medicine, talk to your doctor, pharmacist, or health care provider.  2013, [employer] Standard. (03/11/2008 2:38:05 PM)

## 2012-12-25 ENCOUNTER — Telehealth: Payer: Self-pay | Admitting: *Deleted

## 2012-12-25 NOTE — Telephone Encounter (Signed)
No complaints or questions 

## 2012-12-25 NOTE — Telephone Encounter (Signed)
Left VM for patient to call triage with update on his status since chemo. Call with questions, problems, update.

## 2013-01-02 ENCOUNTER — Encounter: Payer: Self-pay | Admitting: *Deleted

## 2013-01-02 ENCOUNTER — Telehealth: Payer: Self-pay | Admitting: *Deleted

## 2013-01-02 NOTE — Telephone Encounter (Signed)
Spoke with pt regarding appt.  ?

## 2013-01-04 ENCOUNTER — Other Ambulatory Visit: Payer: Self-pay | Admitting: *Deleted

## 2013-01-04 DIAGNOSIS — J90 Pleural effusion, not elsewhere classified: Secondary | ICD-10-CM

## 2013-01-07 ENCOUNTER — Ambulatory Visit (INDEPENDENT_AMBULATORY_CARE_PROVIDER_SITE_OTHER): Payer: Self-pay | Admitting: Physician Assistant

## 2013-01-07 ENCOUNTER — Ambulatory Visit
Admission: RE | Admit: 2013-01-07 | Discharge: 2013-01-07 | Disposition: A | Discharge status: Home / Self Care | Payer: No Typology Code available for payment source | Source: Ambulatory Visit | Attending: Surgery | Admitting: Surgery

## 2013-01-07 VITALS — BP 110/77 | HR 100 | Resp 20 | Ht 74.0 in | Wt 155.0 lb

## 2013-01-07 DIAGNOSIS — J90 Pleural effusion, not elsewhere classified: Secondary | ICD-10-CM

## 2013-01-07 DIAGNOSIS — C3490 Malignant neoplasm of unspecified part of unspecified bronchus or lung: Secondary | ICD-10-CM

## 2013-01-07 DIAGNOSIS — Z09 Encounter for follow-up examination after completed treatment for conditions other than malignant neoplasm: Secondary | ICD-10-CM

## 2013-01-07 NOTE — Progress Notes (Signed)
  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  Since hospital discharge he is doing well.  He states he has had his first round of chemo with no ill effects.  He states that he initially drained his Pleurex catheter daily but on December 24, 2012 his output had decreased to 150cc or less per day.  At that time he began draining his catheter every other day.  His output had remained low until about 1 week ago at which time his output has increased to >300 cc every other day.  The patient is due for his second round of chemotherapy next Monday.   Current Outpatient Prescriptions  Medication Sig Dispense Refill  . lamoTRIgine (LAMICTAL) 150 MG tablet Take 150 mg by mouth at bedtime.      . mirtazapine (REMERON) 45 MG tablet Take 45 mg by mouth at bedtime.      . ondansetron (ZOFRAN) 4 MG tablet Take 1 tablet (4 mg total) by mouth every 6 (six) hours as needed for nausea.  20 tablet  2    Physical Exam:  BP 110/77  Pulse 100  Resp 20  Ht 6\' 2"  (1.88 m)  Wt 155 lb (70.308 kg)  BMI 19.90 kg/m2  SpO2 96%  Gen: no apparent distress Lungs: CTA bilaterally Heart: RRR Skin: Pleurex insertion site is clean and dry  Diagnostic Tests:  CXR: persistent right sided hydropneumothorax, no significant pleural effusion   A/P:  Mr. Malay continues to have drainage out of his Pleurex catheter.  Initially the drainage had decreased but over the past several days has been trending up.  I told the patient to continue draining his catheter every other day.  Should his symptoms worse and drainage continues to increase he may have to go back to daily drainage.  He will continue record his output.  He will contact our office once the output is down to 150 cc or less every other day.  Otherwise we will plan to check on him in one month with a chest xray.

## 2013-01-08 ENCOUNTER — Ambulatory Visit: Payer: Self-pay | Admitting: Surgery

## 2013-01-09 LAB — AFB CULTURE WITH SMEAR (NOT AT ARMC): Acid Fast Smear: NONE SEEN

## 2013-01-14 ENCOUNTER — Other Ambulatory Visit: Payer: Self-pay | Admitting: *Deleted

## 2013-01-14 ENCOUNTER — Encounter: Payer: Self-pay | Admitting: *Deleted

## 2013-01-14 ENCOUNTER — Other Ambulatory Visit (HOSPITAL_BASED_OUTPATIENT_CLINIC_OR_DEPARTMENT_OTHER): Payer: Self-pay | Admitting: Lab

## 2013-01-14 ENCOUNTER — Telehealth: Payer: Self-pay | Admitting: Internal Medicine

## 2013-01-14 ENCOUNTER — Ambulatory Visit (HOSPITAL_BASED_OUTPATIENT_CLINIC_OR_DEPARTMENT_OTHER): Payer: Self-pay | Admitting: Physician Assistant

## 2013-01-14 ENCOUNTER — Ambulatory Visit (HOSPITAL_BASED_OUTPATIENT_CLINIC_OR_DEPARTMENT_OTHER): Payer: Self-pay

## 2013-01-14 ENCOUNTER — Encounter: Payer: Self-pay | Admitting: Physician Assistant

## 2013-01-14 ENCOUNTER — Telehealth: Payer: Self-pay | Admitting: *Deleted

## 2013-01-14 VITALS — BP 119/85 | HR 95 | Temp 97.5°F | Resp 18 | Ht 74.0 in | Wt 160.0 lb

## 2013-01-14 DIAGNOSIS — C3490 Malignant neoplasm of unspecified part of unspecified bronchus or lung: Secondary | ICD-10-CM

## 2013-01-14 DIAGNOSIS — C3430 Malignant neoplasm of lower lobe, unspecified bronchus or lung: Secondary | ICD-10-CM

## 2013-01-14 DIAGNOSIS — Z5111 Encounter for antineoplastic chemotherapy: Secondary | ICD-10-CM

## 2013-01-14 DIAGNOSIS — J90 Pleural effusion, not elsewhere classified: Secondary | ICD-10-CM

## 2013-01-14 DIAGNOSIS — Z5112 Encounter for antineoplastic immunotherapy: Secondary | ICD-10-CM

## 2013-01-14 LAB — UA PROTEIN, DIPSTICK - CHCC: Protein, ur: NEGATIVE mg/dL

## 2013-01-14 LAB — COMPREHENSIVE METABOLIC PANEL (CC13)
ALT: 9 U/L (ref 0–55)
Alkaline Phosphatase: 95 U/L (ref 40–150)
CO2: 27 mEq/L (ref 22–29)
Calcium: 8.9 mg/dL (ref 8.4–10.4)
Chloride: 103 mEq/L (ref 98–107)
Sodium: 139 mEq/L (ref 136–145)
Total Bilirubin: 0.26 mg/dL (ref 0.20–1.20)
Total Protein: 7 g/dL (ref 6.4–8.3)

## 2013-01-14 LAB — CBC WITH DIFFERENTIAL/PLATELET
Eosinophils Absolute: 0.1 10*3/uL (ref 0.0–0.5)
LYMPH%: 26.7 % (ref 14.0–49.0)
MONO#: 0.6 10*3/uL (ref 0.1–0.9)
NEUT#: 3.6 10*3/uL (ref 1.5–6.5)
Platelets: 363 10*3/uL (ref 140–400)
RBC: 4.4 10*6/uL (ref 4.20–5.82)
RDW: 13.6 % (ref 11.0–14.6)
WBC: 5.9 10*3/uL (ref 4.0–10.3)
lymph#: 1.6 10*3/uL (ref 0.9–3.3)

## 2013-01-14 LAB — MAGNESIUM (CC13): Magnesium: 1.9 mg/dl (ref 1.5–2.5)

## 2013-01-14 MED ORDER — ONDANSETRON 16 MG/50ML IVPB (CHCC)
16.0000 mg | Freq: Once | INTRAVENOUS | Status: AC
  Administered 2013-01-14: 16 mg via INTRAVENOUS

## 2013-01-14 MED ORDER — DIPHENHYDRAMINE HCL 50 MG/ML IJ SOLN
50.0000 mg | Freq: Once | INTRAMUSCULAR | Status: AC
  Administered 2013-01-14: 50 mg via INTRAVENOUS

## 2013-01-14 MED ORDER — FAMOTIDINE IN NACL 20-0.9 MG/50ML-% IV SOLN
20.0000 mg | Freq: Once | INTRAVENOUS | Status: AC
  Administered 2013-01-14: 20 mg via INTRAVENOUS

## 2013-01-14 MED ORDER — DEXAMETHASONE SODIUM PHOSPHATE 4 MG/ML IJ SOLN
20.0000 mg | Freq: Once | INTRAMUSCULAR | Status: AC
  Administered 2013-01-14: 20 mg via INTRAVENOUS

## 2013-01-14 MED ORDER — SODIUM CHLORIDE 0.9 % IV SOLN
780.0000 mg | Freq: Once | INTRAVENOUS | Status: AC
  Administered 2013-01-14: 780 mg via INTRAVENOUS
  Filled 2013-01-14: qty 78

## 2013-01-14 MED ORDER — SODIUM CHLORIDE 0.9 % IV SOLN
15.0000 mg/kg | Freq: Once | INTRAVENOUS | Status: AC
  Administered 2013-01-14: 1025 mg via INTRAVENOUS
  Filled 2013-01-14: qty 41

## 2013-01-14 MED ORDER — SODIUM CHLORIDE 0.9 % IV SOLN
Freq: Once | INTRAVENOUS | Status: AC
  Administered 2013-01-14: 11:00:00 via INTRAVENOUS

## 2013-01-14 MED ORDER — PACLITAXEL CHEMO INJECTION 300 MG/50ML
200.0000 mg/m2 | Freq: Once | INTRAVENOUS | Status: AC
  Administered 2013-01-14: 378 mg via INTRAVENOUS
  Filled 2013-01-14: qty 63

## 2013-01-14 NOTE — Patient Instructions (Addendum)
[facility] Discharge Instructions for Patients Receiving Chemotherapy  Today you received the following chemotherapy agents Taxol, Carboplatin and Avastin.  To help prevent nausea and vomiting after your treatment, we encourage you to take your nausea medication as ordered per MD.    If you develop nausea and vomiting that is not controlled by your nausea medication, call the clinic. If it is after clinic hours your family physician or the after hours number for the clinic or go to the Emergency Department.   BELOW ARE SYMPTOMS THAT SHOULD BE REPORTED IMMEDIATELY:  *FEVER GREATER THAN 100.5 F  *CHILLS WITH OR WITHOUT FEVER  NAUSEA AND VOMITING THAT IS NOT CONTROLLED WITH YOUR NAUSEA MEDICATION  *UNUSUAL SHORTNESS OF BREATH  *UNUSUAL BRUISING OR BLEEDING  TENDERNESS IN MOUTH AND THROAT WITH OR WITHOUT PRESENCE OF ULCERS  *URINARY PROBLEMS  *BOWEL PROBLEMS  UNUSUAL RASH Items with * indicate a potential emergency and should be followed up as soon as possible.  Please let the nurse know about any problems that you may have experienced. Feel free to call the clinic you have any questions or concerns. The clinic phone number is [phone].   I have been informed and understand all the instructions given to me. I know to contact the clinic, my physician, or go to the Emergency Department if any problems should occur. I do not have any questions at this time, but understand that I may call the clinic during office hours   should I have any questions or need assistance in obtaining follow up care.    __________________________________________  _____________  __________ Signature of Patient or Authorized Representative            Date                   Time    __________________________________________ Nurse's Signature

## 2013-01-14 NOTE — Telephone Encounter (Signed)
Per staff message and POF I have scheduled appts.  JMW  

## 2013-01-14 NOTE — Patient Instructions (Addendum)
Continue labs per protocol Follow up with Dr. Arbutus Ped in 3 weeks with a restaging CT scan of your chest, abdomen and pelvis to re-evaluate your disease

## 2013-01-14 NOTE — Telephone Encounter (Signed)
appts made and printed for pt,pt given contrast and and aware that he will get a call with scans.  Also aware that mw will add chemo/email to mw       anne

## 2013-01-14 NOTE — Progress Notes (Signed)
[facility] [phone]   [phone]  OFFICE PROGRESS NOTE  DIAGNOSIS: Metastatic non-small cell lung cancer, adenocarcinoma with negative EGFR mutation and negative ALK gene translocation diagnosed in December of 2013.   PRIOR THERAPY: Status post Pleurx catheter placement for drainage of right-sided pleural effusion under the care of Dr. Laneta Simmers on 11/29/2012.   CURRENT THERAPY:  Systemic chemotherapy with carboplatin for AUC of 6, paclitaxel 200 mg/M2 and Avastin 15 mg/kg every 3 weeks according to the ECoG protocol 5508. Status post 1 cycle.  INTERVAL HISTORY: Jay Watts 52 y.o. male returns to the clinic today for followup visit. The patient is feeling fine today with no specific complaints. He gained 5 pounds since his last visit. He tolerated his first cycle of chemotherapy relatively well the exception of hair loss. He ended of getting a cough on Saturday. This was also his birthday and he had a very enjoyable time. He did have some mild constipation for the first one to 2 days after chemotherapy but his bowels have since normalized and he's had no further issues. He continues to get approximately 300 mL of fluid from his Pleurx catheter about every 3 days. The patient denied having any significant chest pain, shortness breath, cough or hemoptysis. He denied having any night sweats, no nausea or vomiting. He has no fever or chills. He presents to proceed with his second cycle of systemic chemotherapy.  MEDICAL HISTORY: Past Medical History  Diagnosis Date  . Bipolar 2 disorder   . Shortness of breath     with exertion  . Pneumothorax     right side- spring 2013  . Lung cancer     ALLERGIES:   has no known allergies.  MEDICATIONS:  Current Outpatient Prescriptions  Medication Sig Dispense Refill  . lamoTRIgine (LAMICTAL) 150 MG tablet Take 150 mg by mouth at bedtime.      . mirtazapine (REMERON) 45 MG tablet Take 45 mg by mouth at  bedtime.      . ondansetron (ZOFRAN) 4 MG tablet Take 1 tablet (4 mg total) by mouth every 6 (six) hours as needed for nausea.  20 tablet  2   No current facility-administered medications for this visit.   Facility-Administered Medications Ordered in Other Visits  Medication Dose Route Frequency Provider Last Rate Last Dose  . bevacizumab (AVASTIN) 1,025 mg in sodium chloride 0.9 % 100 mL chemo infusion  15 mg/kg (Treatment Plan Actual) Intravenous Once Mariyanna Mucha E Lowery Paullin, PA      . CARBOplatin (PARAPLATIN) 780 mg in sodium chloride 0.9 % 250 mL chemo infusion  780 mg Intravenous Once Conni Slipper, PA      . PACLitaxel (TAXOL) 378 mg in dextrose 5 % 500 mL chemo infusion (> 80mg /m2)  200 mg/m2 (Treatment Plan Actual) Intravenous Once Conni Slipper, PA 188 mL/hr at 01/14/13 1138 378 mg at 01/14/13 1138    SURGICAL HISTORY:  Past Surgical History  Procedure Date  . Turbinate reduction   . Chest tube insertion 11/29/2012    Procedure: INSERTION PLEURAL DRAINAGE CATHETER;  Surgeon: [name], MD;  Location: [facility] OR;  Service: Thoracic;  Laterality: Right;    REVIEW OF SYSTEMS:  A comprehensive review of systems was negative except for: Gastrointestinal: positive for constipation Hair loss related to chemotherapy   PHYSICAL EXAMINATION: General appearance: alert, cooperative and no distress Head: Normocephalic, without obvious abnormality, atraumatic Neck: no adenopathy Lymph nodes: Cervical, supraclavicular, and axillary nodes normal.  Resp: clear to auscultation bilaterally Cardio: regular rate and rhythm, S1, S2 normal, no murmur, click, rub or gallop GI: soft, non-tender; bowel sounds normal; no masses,  no organomegaly Extremities: extremities normal, atraumatic, no cyanosis or edema Neurologic: Alert and oriented X 3, normal strength and tone. Normal symmetric reflexes. Normal coordination and gait  ECOG PERFORMANCE STATUS: 1 - Symptomatic but completely ambulatory  Blood  pressure 119/85, pulse 95, temperature 97.5 F (36.4 C), temperature source Oral, resp. rate 18, height 6\' 2"  (1.88 m), weight 160 lb (72.576 kg).  LABORATORY DATA: Lab Results  Component Value Date   WBC 5.9 01/14/2013   HGB 14.4 01/14/2013   HCT 41.4 01/14/2013   MCV 94.0 01/14/2013   PLT 363 01/14/2013      Chemistry      Component Value Date/Time   NA 139 01/14/2013 0843   NA 138 11/26/2012 0500   K 4.6 01/14/2013 0843   K 4.6 11/26/2012 0500   CL 103 01/14/2013 0843   CL 101 11/26/2012 0500   CO2 27 01/14/2013 0843   CO2 27 11/26/2012 0500   BUN 7.9 01/14/2013 0843   BUN 9 11/26/2012 0500   CREATININE 0.8 01/14/2013 0843   CREATININE 0.87 12/01/2012 0545      Component Value Date/Time   CALCIUM 8.9 01/14/2013 0843   CALCIUM 9.0 11/26/2012 0500   ALKPHOS 95 01/14/2013 0843   ALKPHOS 82 11/26/2012 0500   AST 13 01/14/2013 0843   AST 13 11/26/2012 0500   ALT 9 01/14/2013 0843   ALT 9 11/26/2012 0500   BILITOT 0.26 01/14/2013 0843   BILITOT 0.3 11/26/2012 0500       RADIOGRAPHIC STUDIES: Dg Chest 2 View  11/30/2012  *RADIOLOGY REPORT*  Clinical Data: Right-sided Pleurx catheter for malignant effusions. Short of breath.  CHEST - 2 VIEW  Comparison: 11/29/2012  Findings: Right-sided hydropneumothorax.  The degree of lung collapse is stable from the previous day's study.  Fluid on the right there are layers in the right lower hemithorax with an air- fluid level.  This difference is likely due to the more erect position on the current exam.  The right lower hemithorax thorax catheter is stable.  There is some increased opacity in the right lung mostly in the lower lobe that is more collapsed consistent with atelectasis/edema.  The left lung is hyperexpanded with some changes of emphysema at the apex but otherwise clear.  No left pneumothorax.  IMPRESSION: Right-sided hydropneumothorax.  The amount of right lung collapse is similar to the previous day's study allowing for differences in patient  positioning.   Original Report Authenticated By: Amie Portland, M.D.    Dg Chest 2 View  11/27/2012  *RADIOLOGY REPORT*  Clinical Data: Short of breath  CHEST - 2 VIEW  Comparison: 11/26/2012  Findings: Left lung remains hyperaerated with chronic changes at the left lung apex.  Large right pleural effusion is stable.  No pneumothorax.  Cardiac silhouette obscured. Areas of volume loss in the right upper lung zone have improved.  IMPRESSION: Stable large right pleural effusion.  No pneumothorax. Improved volume loss in the aerated right upper lung zone.   Original Report Authenticated By: Jolaine Click, M.D.    Dg Chest 2 View  11/25/2012  *RADIOLOGY REPORT*  Clinical Data: Chest pain, shortness of breath  CHEST - 2 VIEW  Comparison: None.  Findings: Cardiomediastinal silhouette is unremarkable.  There is moderate to large right pleural effusion with right lower lobe atelectasis atelectasis or infiltrate.  Left lung is clear.  IMPRESSION: Moderate to large right pleural effusion with right lower lobe atelectasis or infiltrate.   Original Report Authenticated By: Natasha Mead, M.D.    Ct Head W Wo Contrast  11/30/2012  *RADIOLOGY REPORT*  Clinical Data: Lung cancer staging  CT HEAD WITHOUT AND WITH CONTRAST  Technique:  Contiguous axial images were obtained from the base of the skull through the vertex without and with intravenous contrast.  Contrast: OMNIPAQUE IOHEXOL 300 MG/ML  SOLN  Comparison: None.  Findings: Ventricle size is normal.  Negative for acute infarct. Small chronic infarct left internal capsule.  Negative for hemorrhage or mass.  No edema in the brain.  Postcontrast images reveal no enhancing lesions.  Calvarium is intact.  Sinuses are clear.  IMPRESSION: Chronic lacunar infarct left internal capsule.  Negative for metastatic disease.   Original Report Authenticated By: Janeece Riggers, M.D.    Ct Chest W Contrast  11/25/2012  *RADIOLOGY REPORT*  Clinical Data: Chest pain, shortness of  breath  CT CHEST WITH CONTRAST  Technique:  Multidetector CT imaging of the chest was performed following the standard protocol during bolus administration of intravenous contrast.  Contrast: OMNIPAQUE IOHEXOL 300 MG/ML  SOLN  Comparison: Chest x-ray same day.  Findings: No destructive bony lesions are noted.  Central airways are patent.  Bilateral apical large paraseptal emphysematous bullae are noted.  There is a large right pleural effusion.  There is significant atelectasis in the right middle lobe partially right upper lobe and right lower lobe.  There is some pleural thickening in the right midlung posteriorly best seen in axial image 44.  Malignant pleural effusion cannot be excluded.  Correlation with fluid analysis and cytology is recommended.  No definite enhancing lung mass is noted.  There is some low density in the right lower lobe centrally measures about 2 cm.  Called to mass cannot be excluded.  Further evaluation with bronchoscopy or repeat CT examination after lung re-expansion is recommended to exclude a subtle mass.  A subcarinal lymph node measures 2.4 x 1.7 cm.  There is a right hilar lymph node measures 2.2 x 1.8 cm.  Pretracheal lymph node measures 2.3 x 1.9 cm.  There is a right anterior mediastinal lymph node measures 1.9 x 1.5 cm. A right paratracheal lymph node measures 1.6 x 1.4 cm.  Small pericardial effusion.  There is a right anterior diaphragmatic nodule or lymph node best seen in axial image 56 measures 1.1 cm. Pathologic adenopathy or metastatic disease cannot be excluded.  No central pulmonary embolus is noted.  Left lung is clear. Minimal atelectasis noted in the left lower lobe.  IMPRESSION:  1.  A large right pleural effusion is noted.  There is some pleural thickening in the right lung posteriorly.  Highly suspicious for malignant pleural effusion.  Correlation with fluid analysis and cytology is recommended.  There is atelectasis of the right lower lobe right middle  lobe and partial atelectasis of the right upper lobe. 2.  There is mediastinal and right hilar adenopathy. 3.  No definite enhancing lung mass is noted.  There is some central low density within the right lower lobe.  A occult mass cannot be excluded. Correlation with bronchoscopy or repeat CT scan after lung re-expansion is recommended. 4.  A right anterior diaphragmatic nodule or lymph node measures 1.1 cm. Metastatic adenopathy cannot be excluded.   Original Report Authenticated By: Natasha Mead, M.D.    Nm Bone Scan Whole Body  11/30/2012  *RADIOLOGY REPORT*  Clinical Data: Diagnosis of lung cancer.  NUCLEAR MEDICINE WHOLE BODY BONE SCINTIGRAPHY  Technique:  Whole body anterior and posterior images were obtained approximately 3 hours after intravenous injection of radiopharmaceutical.  Radiopharmaceutical: CURIE TC-MDP TECHNETIUM TC 70M MEDRONATE IV KIT  Comparison: None.  Findings: There are no areas of abnormal activity in the skeleton. The patient has a slight thoracolumbar scoliosis.  IMPRESSION: No evidence of metastatic disease to the skeleton.   Original Report Authenticated By: Francene Boyers, M.D.    Ct Abdomen Pelvis W Contrast  11/30/2012  *RADIOLOGY REPORT*  Clinical Data: Lung cancer  CT ABDOMEN AND PELVIS WITH CONTRAST  Technique:  Multidetector CT imaging of the abdomen and pelvis was performed following the standard protocol during bolus administration of intravenous contrast.  Contrast: OMNIPAQUE IOHEXOL 300 MG/ML  SOLN  Comparison: CT chest from 11/25/2012  Findings: Images which include the lower chest show a large right basilar hydropneumothorax with a small bore chest tube in place.  No focal abnormalities seen in the liver or spleen.  The liver measures 18.9 cm in cranial caudal length, enlarged.  The stomach, duodenum, pancreas, gallbladder, and adrenal glands have normal imaging features.  Kidneys are unremarkable.  No abdominal aortic aneurysm.  Lymph nodes are  identified in the gastrohepatic ligament measuring up to 13 mm in short axis.  Some of these have central low attenuation raising concern for necrosis.  Imaging through the pelvis shows no free intraperitoneal fluid.  No pelvic sidewall lymphadenopathy.  Prostate gland is unremarkable. Bladder has normal imaging features.  Terminal ileum is normal. The appendix is normal.  Bone windows reveal no worrisome lytic or sclerotic osseous lesions.  IMPRESSION: Hepatomegaly.  Lymph nodes are identified in the gastrohepatic ligament some of which appear to have central necrosis.  Although these lymph nodes are not markedly enlarged, the central low attenuation is concerning for metastatic involvement.  PET CT may provide additional characterization as clinically warranted.  Large basilar hydropneumothorax.   Original Report Authenticated By: Kennith Center, M.D.    Nm Pet Image Initial (pi) Skull Base To Thigh  12/11/2012  *RADIOLOGY REPORT*  Clinical Data: Initial treatment strategy for lung cancer.  NUCLEAR MEDICINE PET SKULL BASE TO THIGH  Fasting Blood Glucose:  80  Technique:  19.6 mCi F-18 FDG was injected intravenously. CT data was obtained and used for attenuation correction and anatomic localization only.  (This was not acquired as a diagnostic CT examination.) Additional exam technical data entered on technologist worksheet.  Comparison:  Chest CT 11/25/2012, abdominal CT and bone scan 11/30/2012.  Findings:  Neck: There are no hypermetabolic lymph nodes in the upper neck. There are small right supraclavicular nodes which are mildly hypermetabolic.  Chest:  There is extensive nodular hypermetabolic activity throughout the right pleural space consistent with extensive pleural tumor.  This pleural activity has an SUV max of 10.6 laterally at the apex and 8.1 medially at the right base.  The right pleural effusion has decreased in volume status post pleural catheter placement.  There is abnormal activity anteriorly  within the right chest wall, likely secondary to metastases within right subpectoral and internal mammary lymph nodes. There is multifocal hypermetabolic right hilar and mediastinal lymphadenopathy.  A right paratracheal node measuring 1.4 cm on image 69 has an SUV max of 16.6.  A 2.3 x 2.6 cm precarinal nodal mass on image 89 has an SUV max of 17.7.  A 1.6 x 2.9 cm subcarinal node  on image 101 has an SUV max of 17.7. There is a subpleural right lower lobe mass which demonstrates an SUV max of 4.8.  No other abnormal pulmonary activity is identified.  Abdomen/Pelvis:  No abnormal activity is seen within the liver, adrenal glands, spleen or pancreas.  However, there is a hypermetabolic lymphadenopathy in the upper abdomen with a 1.4 x 1.8 cm lymph node in the gastrohepatic ligament having an SUV max of 14.3.  There is an additional hypermetabolic retrocaval node at the level of the renal vessels.  No lower abdominal or pelvic hypermetabolic lymph nodes are identified.  Skeleton:  No focal hypermetabolic activity to suggest skeletal metastasis.  IMPRESSION:  1.  Extensive metastatic disease in the right hemithorax with multi focal pleural involvement, hypermetabolic right hilar, mediastinal and chest wall lymph nodes.  A hypermetabolic right lower lobe lesion may reflect the primary malignancy. 2.  Hypermetabolic lymph nodes in the upper abdomen consistent with metastatic disease. 3.  No evidence metastatic disease to the liver, adrenal glands or bones.   Original Report Authenticated By: Carey Bullocks, M.D.    Dg Chest Port 1 View  11/29/2012  *RADIOLOGY REPORT*  Clinical Data: Status post right chest tube placement.  PORTABLE CHEST - 1 VIEW  Comparison: 11/27/2012.  Findings: Interval placement of a small caliber chest tube at the right lung base with evacuation of the previously demonstrated right pleural fluid.  There is an approximately 30% right pneumothorax.  No mediastinal shift.  Mild atelectasis in the  right lung.  The left lung is hyperexpanded with stable minimally prominent interstitial markings.  Normal sized heart.  Minimal scoliosis.  IMPRESSION:  1.  Approximately 30% right pneumothorax without mediastinal shift following right chest tube placement. 2.  Mild changes of COPD. 3.  Mild right lung atelectasis.   Original Report Authenticated By: Beckie Salts, M.D.    Dg Chest Port 1 View  11/26/2012  *RADIOLOGY REPORT*  Clinical Data: Thoracentesis  PORTABLE CHEST - 1 VIEW  Comparison: Yesterday  Findings: No pneumothorax post right thoracentesis.  Right pleural effusion is improved but remains prominent. Left lung remains hyperaerated.  Shift of the mediastinum to the left has normalized and is now midline.  IMPRESSION: No pneumothorax post thoracentesis.   Original Report Authenticated By: Jolaine Click, M.D.     ASSESSMENT/PLAN: This is a very pleasant 52 years old white male recently diagnosed with metastatic non-small cell lung cancer, adenocarcinoma with negative EGFR mutation and negative ALK gene translocation. Patient is currently being treated with systemic chemotherapy in the form of carboplatin, paclitaxel and Avastin according to the ECoG protocol 5508. He status post 1 cycle. Overall he is tolerated his systemic chemotherapy without difficulty. Patient was discussed with Dr. Arbutus Ped. He'll proceed with cycle #2 of his systemic chemotherapy with carboplatin, paclitaxel and Avastin. He will continue with labs as per protocol. He'll followup with Dr. Arbutus Ped in 3 weeks with a restaging CT scan, again as per protocol.  Laural Benes, Aryssa Rosamond E, PA-C   All questions were answered. The patient knows to call the clinic with any problems, questions or concerns. We can certainly see the patient much sooner if necessary.  I spent 20 minutes counseling the patient face to face. The total time spent in the appointment was 30 minutes.

## 2013-01-14 NOTE — Progress Notes (Signed)
01/14/13, U9811, cycle 2 Mr. Odem is in the cancer center today for lab work, physical exam, and cycle 2 on the E5508 clinical trial. Examined by Karrie Doffing, PA.  He reports no nausea or vomiting after his first cycle. He celebrated a birthday, enjoyed it very much, and ate a lot of good food. His appetite is good, taste is without change. He did lose most of his hair this past week-end and had the remaining shaved off.

## 2013-01-31 ENCOUNTER — Ambulatory Visit (HOSPITAL_COMMUNITY): Payer: Self-pay

## 2013-02-01 ENCOUNTER — Encounter (HOSPITAL_COMMUNITY): Payer: Self-pay

## 2013-02-01 ENCOUNTER — Ambulatory Visit (HOSPITAL_COMMUNITY)
Admission: RE | Admit: 2013-02-01 | Discharge: 2013-02-01 | Disposition: A | Discharge status: Home / Self Care | Payer: Medicaid Other | Source: Ambulatory Visit | Attending: Internal Medicine | Admitting: Internal Medicine

## 2013-02-01 ENCOUNTER — Other Ambulatory Visit: Payer: Self-pay | Admitting: Internal Medicine

## 2013-02-01 DIAGNOSIS — J438 Other emphysema: Secondary | ICD-10-CM | POA: Insufficient documentation

## 2013-02-01 DIAGNOSIS — C3490 Malignant neoplasm of unspecified part of unspecified bronchus or lung: Secondary | ICD-10-CM

## 2013-02-01 DIAGNOSIS — J90 Pleural effusion, not elsewhere classified: Secondary | ICD-10-CM | POA: Insufficient documentation

## 2013-02-01 DIAGNOSIS — C782 Secondary malignant neoplasm of pleura: Secondary | ICD-10-CM | POA: Insufficient documentation

## 2013-02-01 DIAGNOSIS — J9819 Other pulmonary collapse: Secondary | ICD-10-CM | POA: Insufficient documentation

## 2013-02-01 DIAGNOSIS — R599 Enlarged lymph nodes, unspecified: Secondary | ICD-10-CM | POA: Insufficient documentation

## 2013-02-01 DIAGNOSIS — J984 Other disorders of lung: Secondary | ICD-10-CM | POA: Insufficient documentation

## 2013-02-01 MED ORDER — IOHEXOL 300 MG/ML  SOLN
100.0000 mL | Freq: Once | INTRAMUSCULAR | Status: AC | PRN
  Administered 2013-02-01: 100 mL via INTRAVENOUS

## 2013-02-02 ENCOUNTER — Other Ambulatory Visit: Payer: Self-pay

## 2013-02-04 ENCOUNTER — Other Ambulatory Visit (HOSPITAL_BASED_OUTPATIENT_CLINIC_OR_DEPARTMENT_OTHER): Payer: Medicaid Other | Admitting: Lab

## 2013-02-04 ENCOUNTER — Encounter: Payer: Self-pay | Admitting: Internal Medicine

## 2013-02-04 ENCOUNTER — Other Ambulatory Visit: Payer: Self-pay | Admitting: *Deleted

## 2013-02-04 ENCOUNTER — Ambulatory Visit (HOSPITAL_BASED_OUTPATIENT_CLINIC_OR_DEPARTMENT_OTHER): Payer: Medicaid Other

## 2013-02-04 ENCOUNTER — Encounter: Payer: Self-pay | Admitting: *Deleted

## 2013-02-04 ENCOUNTER — Ambulatory Visit (HOSPITAL_BASED_OUTPATIENT_CLINIC_OR_DEPARTMENT_OTHER): Payer: Medicaid Other | Admitting: Internal Medicine

## 2013-02-04 VITALS — BP 129/79 | HR 98 | Temp 97.0°F | Resp 20 | Ht 74.0 in | Wt 160.9 lb

## 2013-02-04 VITALS — BP 115/78 | HR 84 | Temp 98.1°F | Resp 20

## 2013-02-04 DIAGNOSIS — C3490 Malignant neoplasm of unspecified part of unspecified bronchus or lung: Secondary | ICD-10-CM

## 2013-02-04 DIAGNOSIS — C3430 Malignant neoplasm of lower lobe, unspecified bronchus or lung: Secondary | ICD-10-CM

## 2013-02-04 DIAGNOSIS — Z5112 Encounter for antineoplastic immunotherapy: Secondary | ICD-10-CM

## 2013-02-04 DIAGNOSIS — Z5111 Encounter for antineoplastic chemotherapy: Secondary | ICD-10-CM

## 2013-02-04 LAB — COMPREHENSIVE METABOLIC PANEL (CC13)
AST: 11 U/L (ref 5–34)
Albumin: 3.4 g/dL — ABNORMAL LOW (ref 3.5–5.0)
Alkaline Phosphatase: 97 U/L (ref 40–150)
Glucose: 99 mg/dl (ref 70–99)
Potassium: 4.3 mEq/L (ref 3.5–5.1)
Sodium: 135 mEq/L — ABNORMAL LOW (ref 136–145)
Total Bilirubin: 0.59 mg/dL (ref 0.20–1.20)
Total Protein: 7.2 g/dL (ref 6.4–8.3)

## 2013-02-04 LAB — UA PROTEIN, DIPSTICK - CHCC: Protein, ur: NEGATIVE mg/dL

## 2013-02-04 LAB — CBC WITH DIFFERENTIAL/PLATELET
Basophils Absolute: 0 10*3/uL (ref 0.0–0.1)
EOS%: 0.8 % (ref 0.0–7.0)
HGB: 14 g/dL (ref 13.0–17.1)
MCH: 32.7 pg (ref 27.2–33.4)
MCV: 93.3 fL (ref 79.3–98.0)
MONO%: 15.4 % — ABNORMAL HIGH (ref 0.0–14.0)
NEUT%: 61.1 % (ref 39.0–75.0)
RDW: 15.8 % — ABNORMAL HIGH (ref 11.0–14.6)

## 2013-02-04 LAB — LACTATE DEHYDROGENASE (CC13): LDH: 119 U/L — ABNORMAL LOW (ref 125–245)

## 2013-02-04 MED ORDER — DEXAMETHASONE SODIUM PHOSPHATE 4 MG/ML IJ SOLN
20.0000 mg | Freq: Once | INTRAMUSCULAR | Status: AC
  Administered 2013-02-04: 20 mg via INTRAVENOUS

## 2013-02-04 MED ORDER — SODIUM CHLORIDE 0.9 % IV SOLN
Freq: Once | INTRAVENOUS | Status: AC
  Administered 2013-02-04: 10:00:00 via INTRAVENOUS

## 2013-02-04 MED ORDER — ONDANSETRON 16 MG/50ML IVPB (CHCC)
16.0000 mg | Freq: Once | INTRAVENOUS | Status: AC
  Administered 2013-02-04: 16 mg via INTRAVENOUS

## 2013-02-04 MED ORDER — CARBOPLATIN CHEMO INJECTION 600 MG/60ML
780.0000 mg | Freq: Once | INTRAVENOUS | Status: AC
  Administered 2013-02-04: 780 mg via INTRAVENOUS
  Filled 2013-02-04: qty 78

## 2013-02-04 MED ORDER — SODIUM CHLORIDE 0.9 % IJ SOLN
50.0000 mL | Freq: Once | INTRAMUSCULAR | Status: AC
  Administered 2013-02-04: 50 mL via INTRAVENOUS
  Filled 2013-02-04: qty 50

## 2013-02-04 MED ORDER — SODIUM CHLORIDE 0.9 % IV SOLN
15.0000 mg/kg | Freq: Once | INTRAVENOUS | Status: AC
  Administered 2013-02-04: 1025 mg via INTRAVENOUS
  Filled 2013-02-04: qty 41

## 2013-02-04 MED ORDER — DIPHENHYDRAMINE HCL 50 MG/ML IJ SOLN
50.0000 mg | Freq: Once | INTRAMUSCULAR | Status: AC
  Administered 2013-02-04: 50 mg via INTRAVENOUS

## 2013-02-04 MED ORDER — PACLITAXEL CHEMO INJECTION 300 MG/50ML
200.0000 mg/m2 | Freq: Once | INTRAVENOUS | Status: AC
  Administered 2013-02-04: 378 mg via INTRAVENOUS
  Filled 2013-02-04: qty 63

## 2013-02-04 MED ORDER — FAMOTIDINE IN NACL 20-0.9 MG/50ML-% IV SOLN
20.0000 mg | Freq: Once | INTRAVENOUS | Status: AC
  Administered 2013-02-04: 20 mg via INTRAVENOUS

## 2013-02-04 NOTE — Progress Notes (Signed)
01/04/13, E4540, cycle 3. Jay Watts is in the cancer center today for lab work, physical exam, discussion of CT scans, and cycle 3 chemotherapy on the E5508 clinical trial. Exam and review of CT scan  by Dr. Arbutus Ped today.  Jay Watts does not report any adverse events except alopecia. His ECOG PS remains 1.  Lab results were WNL for treatment. Per Dr. Arbutus Ped, CT scans show response to the first 2 cycles of treatment.  Sign for treatment given to N. Bradly Chris, Charity fundraiser.

## 2013-02-04 NOTE — Progress Notes (Signed)
Vital signs stable and within range, pre and post Avastin; patient received 50ml normal saline post Avastin.

## 2013-02-04 NOTE — Patient Instructions (Signed)
Your scan showed improvement in your disease. Continue chemotherapy with the same regimen. Followup in 3 weeks.

## 2013-02-04 NOTE — Patient Instructions (Signed)
Patient aware of next appointment; discharged home with no complaints. 

## 2013-02-04 NOTE — Progress Notes (Signed)
[facility] [phone]   [phone]  OFFICE PROGRESS NOTE   DIAGNOSIS: Metastatic non-small cell lung cancer, adenocarcinoma with negative EGFR mutation and negative ALK gene translocation diagnosed in December of 2013.   PRIOR THERAPY: Status post Pleurx catheter placement for drainage of right-sided pleural effusion under the care of Dr. Laneta Simmers on 11/29/2012.   CURRENT THERAPY: Systemic chemotherapy with carboplatin for AUC of 6, paclitaxel 200 mg/M2 and Avastin 15 mg/kg every 3 weeks according to the ECoG protocol 5508. Status post 2 cycles.  INTERVAL HISTORY: Jay Watts 52 y.o. male returns to the clinic today for routine followup visit. The patient is doing fine today with no specific complaints. He tolerated the second cycle of his systemic chemotherapy with carboplatin, paclitaxel and Avastin fairly well. He denied having any significant fever or chills, no nausea or vomiting, no weight loss or night sweats. The patient denied having any significant peripheral neuropathy. He denied having any significant chest pain, shortness breath, cough or hemoptysis. He has repeat CT scan of the chest, abdomen and pelvis performed recently and he is here for evaluation and discussion of his scan results.  MEDICAL HISTORY: Past Medical History  Diagnosis Date  . Bipolar 2 disorder   . Shortness of breath     with exertion  . Pneumothorax     right side- spring 2013  . Lung cancer     ALLERGIES:  has No Known Allergies.  MEDICATIONS:  Current Outpatient Prescriptions  Medication Sig Dispense Refill  . lamoTRIgine (LAMICTAL) 150 MG tablet Take 150 mg by mouth at bedtime.      . mirtazapine (REMERON) 45 MG tablet Take 45 mg by mouth at bedtime.      . ondansetron (ZOFRAN) 4 MG tablet Take 1 tablet (4 mg total) by mouth every 6 (six) hours as needed for nausea.  20 tablet  2   No current facility-administered medications for this visit.     SURGICAL HISTORY:  Past Surgical History  Procedure Laterality Date  . Turbinate reduction    . Chest tube insertion  11/29/2012    Procedure: INSERTION PLEURAL DRAINAGE CATHETER;  Surgeon: [name], MD;  Location: [facility] OR;  Service: Thoracic;  Laterality: Right;    REVIEW OF SYSTEMS:  A comprehensive review of systems was negative.   PHYSICAL EXAMINATION: General appearance: alert, cooperative and no distress Head: Normocephalic, without obvious abnormality, atraumatic Neck: no adenopathy Lymph nodes: Cervical, supraclavicular, and axillary nodes normal. Resp: clear to auscultation bilaterally Cardio: regular rate and rhythm, S1, S2 normal, no murmur, click, rub or gallop GI: soft, non-tender; bowel sounds normal; no masses,  no organomegaly Extremities: extremities normal, atraumatic, no cyanosis or edema Neurologic: Alert and oriented X 3, normal strength and tone. Normal symmetric reflexes. Normal coordination and gait  ECOG PERFORMANCE STATUS: 0 - Asymptomatic  There were no vitals taken for this visit.  LABORATORY DATA: Lab Results  Component Value Date   WBC 5.8 02/04/2013   HGB 14.0 02/04/2013   HCT 40.0 02/04/2013   MCV 93.3 02/04/2013   PLT 307 02/04/2013      Chemistry      Component Value Date/Time   NA 139 01/14/2013 0843   NA 138 11/26/2012 0500   K 4.6 01/14/2013 0843   K 4.6 11/26/2012 0500   CL 103 01/14/2013 0843   CL 101 11/26/2012 0500   CO2 27 01/14/2013 0843   CO2 27 11/26/2012 0500   BUN  7.9 01/14/2013 0843   BUN 9 11/26/2012 0500   CREATININE 0.8 01/14/2013 0843   CREATININE 0.87 12/01/2012 0545      Component Value Date/Time   CALCIUM 8.9 01/14/2013 0843   CALCIUM 9.0 11/26/2012 0500   ALKPHOS 95 01/14/2013 0843   ALKPHOS 82 11/26/2012 0500   AST 13 01/14/2013 0843   AST 13 11/26/2012 0500   ALT 9 01/14/2013 0843   ALT 9 11/26/2012 0500   BILITOT 0.26 01/14/2013 0843   BILITOT 0.3 11/26/2012 0500       RADIOGRAPHIC STUDIES: Dg Chest 2  View  01/07/2013  *RADIOLOGY REPORT*  Clinical Data: Pleural effusion and shortness of breath.  CHEST - 2 VIEW  Comparison: 11/30/2012.  Findings: Trachea is midline.  Trachea is deviated slightly to the right.  Heart size normal.  Right apical pleural parenchymal scarring.  Small right hydropneumothorax with right chest tube in place at the base of the right hemithorax. Amount of pleural fluid has decreased in the interval from 11/30/2012.  Lungs are emphysematous.  IMPRESSION:  1.  Small right hydropneumothorax with right chest tube in place. Amount of pleural fluid has decreased from 11/30/2012. 2.  Right lower lobe mass is better seen on 12/11/2012.   Original Report Authenticated By: Leanna Battles, M.D.    Ct Chest W Contrast  02/01/2013  *RADIOLOGY REPORT*  Clinical Data:  Restaging lung cancer recist protocol  CT CHEST, ABDOMEN AND PELVIS WITH CONTRAST  Technique:  Multidetector CT imaging of the chest, abdomen and pelvis was performed following the standard protocol during bolus administration of intravenous contrast.  Contrast: OMNIPAQUE IOHEXOL 300 MG/ML  SOLN  Comparison:  PET scan 12/11/2012, CT abdomen 12/17/2012, CT thorax 11/25/2012  RECIST protocol 1.1  Target Lesions:  1.Right lower lobe density measures 1.8 cm compared to 2.1 cm on prior (image 51). This lesion is now enhancing and is adjacent to the rounded atelectasis.  2. Pretracheal lymph node measures 1.5 cm compared to 1.9 cm on prior (image 28).  Non Target lesions:  1. Subcarinal lymph node measures 11 mm compared to 17 mm on prior (image 34) 2.  Right hilar lymph node measures 10 mm compared to 18 mm on prior (image 32). 3.  Anterior (prevascular) lymph node measures 10 mm short axis compared to 15 mm on prior (image 22). 4.  Pleural effusion present.  CT CHEST  Findings:  No axillary or supraclavicular lymphadenopathy. Mediastinal lymphadenopathy is decreased compared to prior. 15 mm right lower paratracheal node is decreased  from 22 mm comparison PET CT scan.  Subcarinal lymph node measures 11 mm (image 34) decreased from 16 mm on prior.  No pericardial fluid.  Review of the lung parenchyma demonstrates a small persistent right pleural effusion with a small bore chest tube in place.  There is a focus of round atelectasis at the right lung base measuring 4.3 x 2.4 cm which is decreased in volume from 5.0 x 3.2 cm on comparison PET CT scan.  Atelectasis is adjacent to a pleural based metastasis measuring 17 mm (image 51) which is difficult to see on the noncontrast PET CT scan.  The pleural metastasis seen on comparison PET CT scan appear reduced although difficult to appreciate on the noncontrast CT.  Left lung is clear.  Paraseptal emphysema is present.  IMPRESSION:  1.  Interval reduction in volume of mediastinal lymphadenopathy. 2.  Interval reduction in volume of round atelectasis at the right lung base with pleural metastasis associated. 3.  Pleural based metastasis seen on comparison PET CT scan are likely reduced.  CT ABDOMEN AND PELVIS  Findings:  No focal hepatic lesion.  Gallbladder, pancreas, spleen, adrenal glands, and kidneys are normal.  Stomach, small bowel, and colon are normal.  Abdominal aorta normal caliber.  No retroperitoneal periportal lymphadenopathy.  Enlarged gastrohepatic lymph node seen on comparison PET CT scan difficult to appreciate but appears smaller measuring less than 10 mm (image 67).  No free fluid the pelvis.  The prostate gland bladder normal.  No pelvic lymphadenopathy.  There is a peritoneal disease or mesenteric disease.  Review of  bone windows demonstrates no aggressive osseous lesions.  IMPRESSION:  1.  Decrease in the volume of lymphadenopathy in the upper abdomen/gastrohepatic ligament.  2.  No evidence of disease progression in the abdomen or pelvis.   Original Report Authenticated By: Genevive Bi, M.D.     ASSESSMENT: This is a very pleasant 52 years old white male with metastatic  non-small cell lung cancer, adenocarcinoma currently on systemic chemotherapy with carboplatin, paclitaxel and Avastin status post 2 cycles with partial response.  PLAN: I discussed the scan results with the patient today. I recommended for him to continue treatment with the same regimen carboplatin, paclitaxel and Avastin for 2 more cycles before randomization to maintenance therapy if he has no evidence for disease progression. He was started cycle #3 today. The patient would come back for followup visit in 3 weeks with the start of cycle #4. He was advised to call me immediately if he has any concerning symptoms in the interval.  All questions were answered. The patient knows to call the clinic with any problems, questions or concerns. We can certainly see the patient much sooner if necessary.  I spent 15 minutes counseling the patient face to face. The total time spent in the appointment was 25 minutes.

## 2013-02-05 ENCOUNTER — Encounter: Payer: Self-pay | Admitting: Surgical

## 2013-02-05 ENCOUNTER — Ambulatory Visit (INDEPENDENT_AMBULATORY_CARE_PROVIDER_SITE_OTHER): Payer: Self-pay | Admitting: Surgical

## 2013-02-05 VITALS — BP 117/75 | HR 104 | Resp 20 | Ht 74.0 in | Wt 160.0 lb

## 2013-02-05 DIAGNOSIS — J90 Pleural effusion, not elsewhere classified: Secondary | ICD-10-CM

## 2013-02-05 DIAGNOSIS — C3490 Malignant neoplasm of unspecified part of unspecified bronchus or lung: Secondary | ICD-10-CM

## 2013-02-05 DIAGNOSIS — Z09 Encounter for follow-up examination after completed treatment for conditions other than malignant neoplasm: Secondary | ICD-10-CM

## 2013-02-05 NOTE — Patient Instructions (Signed)
Drain the Pleurx catheter next Tuesday and record amount.

## 2013-02-05 NOTE — Progress Notes (Signed)
                    [address]            [place] [address]          [phone]       Jay Watts [facility] Medical Record #[id number] Date of Birth: 1961-07-03  No ref. provider found No primary provider on file.  Chief Complaint:   PostOp Follow Up Visit   History of Present Illness:    The patient continues to do well. He is an excellent spirits. Recent drainage has decreased and most recently he had 175 cc removed from the Pleurx over a four-day interval.. He is anxious to get the Pleurx removed, but has only minimal discomfort from it.           History  Smoking status  . Former Smoker -- 1.00 packs/day for 35 years  . Types: Cigarettes  Smokeless tobacco  . Former Neurosurgeon  . Quit date: 11/25/2012       No Known Allergies  Current Outpatient Prescriptions  Medication Sig Dispense Refill  . lamoTRIgine (LAMICTAL) 150 MG tablet Take 150 mg by mouth at bedtime.      . mirtazapine (REMERON) 45 MG tablet Take 45 mg by mouth at bedtime.      . ondansetron (ZOFRAN) 4 MG tablet Take 1 tablet (4 mg total) by mouth every 6 (six) hours as needed for nausea.  20 tablet  2   No current facility-administered medications for this visit.       Physical Exam: BP 117/75  Pulse 104  Resp 20  Ht 6\' 2"  (1.88 m)  Wt 160 lb (72.576 kg)  BMI 20.53 kg/m2  SpO2 97%  General appearance: alert, cooperative and no distress Heart: regular rate and rhythm Lungs: clear to auscultation bilaterally Wounds: dressing CDI  Diagnostic Studies & Laboratory data:         Recent Radiology Findings: No results found.    Recent Labs: Lab Results  Component Value Date   WBC 5.8 02/04/2013   HGB 14.0 02/04/2013   HCT 40.0 02/04/2013   PLT 307 02/04/2013   GLUCOSE 99 02/04/2013   ALT 12 02/04/2013   AST 11 02/04/2013   NA 135* 02/04/2013   K 4.3 02/04/2013   CL 97* 02/04/2013   CREATININE 0.8 02/04/2013   BUN 5.8* 02/04/2013   CO2 30* 02/04/2013   TSH 0.826  11/25/2012   INR 1.00* 12/13/2012     Assessment / Plan:  Recent records indicate that the patient is having approximately 60-80 cc of fluid daily. We will see him in one week and at that time he will drain the Pleurx catheter and depending on the amount the Pleurx may be able to be scheduled for removal. He understands that if he has increased shortness of breath however he can drain it prior to then.          Jay Watts E 02/05/2013 1:47 PM

## 2013-02-08 ENCOUNTER — Telehealth: Payer: Self-pay | Admitting: Internal Medicine

## 2013-02-08 ENCOUNTER — Other Ambulatory Visit: Payer: Self-pay | Admitting: *Deleted

## 2013-02-08 DIAGNOSIS — J90 Pleural effusion, not elsewhere classified: Secondary | ICD-10-CM

## 2013-02-08 NOTE — Telephone Encounter (Signed)
S/w pt confirming appt for 3/10.

## 2013-02-12 ENCOUNTER — Ambulatory Visit
Admission: RE | Admit: 2013-02-12 | Discharge: 2013-02-12 | Disposition: A | Discharge status: Home / Self Care | Payer: Medicaid Other | Source: Ambulatory Visit | Attending: Surgery | Admitting: Surgery

## 2013-02-12 ENCOUNTER — Encounter: Payer: Self-pay | Admitting: Surgery

## 2013-02-12 ENCOUNTER — Encounter (HOSPITAL_COMMUNITY): Payer: Self-pay | Admitting: Pharmacy Technician

## 2013-02-12 ENCOUNTER — Other Ambulatory Visit: Payer: Self-pay | Admitting: *Deleted

## 2013-02-12 ENCOUNTER — Ambulatory Visit (INDEPENDENT_AMBULATORY_CARE_PROVIDER_SITE_OTHER): Payer: Medicaid Other | Admitting: Surgery

## 2013-02-12 ENCOUNTER — Encounter: Payer: Self-pay | Admitting: *Deleted

## 2013-02-12 VITALS — BP 125/77 | HR 91 | Resp 16 | Ht 75.0 in | Wt 160.5 lb

## 2013-02-12 DIAGNOSIS — J90 Pleural effusion, not elsewhere classified: Secondary | ICD-10-CM

## 2013-02-12 DIAGNOSIS — Z09 Encounter for follow-up examination after completed treatment for conditions other than malignant neoplasm: Secondary | ICD-10-CM

## 2013-02-12 DIAGNOSIS — J910 Malignant pleural effusion: Secondary | ICD-10-CM

## 2013-02-12 NOTE — Progress Notes (Signed)
  [address]       [place] [address]             [phone]     HPI:  The patient returns today for followup of his right Pleurx catheter status post insertion on 11/29/2012 for metastatic adenocarcinoma of the lung with malignant right pleural effusion. His catheter was last drained on 02/07/2013 and put out 100 cc for 3 day period. It was drained in the office today and there was minimal output. He is continuing chemotherapy and overall doing well. His last CT scan showed a decrease in size of all the lesions with no sign of progression of disease  Current Outpatient Prescriptions   Medication  Sig  Dispense  Refill   .  lamoTRIgine (LAMICTAL) 150 MG tablet  Take 150 mg by mouth at bedtime.     .  mirtazapine (REMERON) 45 MG tablet  Take 45 mg by mouth at bedtime.     .  ondansetron (ZOFRAN) 4 MG tablet  Take 1 tablet (4 mg total) by mouth every 6 (six) hours as needed for nausea.  20 tablet  2    No current facility-administered medications for this visit.   Physical Exam:  BP 125/77  Pulse 91  Resp 16  Ht 6' 3" (1.905 m)  Wt 160 lb 8 oz (72.802 kg)  BMI 20.06 kg/m2  SpO2 98%  He looks well.  Lung exam reveals slight decreased breath sounds at the right base.  The Pleurx catheter site looks good.  Diagnostic Tests:  *RADIOLOGY REPORT*  Clinical Data: Restaging lung cancer recist protocol  CT CHEST, ABDOMEN AND PELVIS WITH CONTRAST  Technique: Multidetector CT imaging of the chest, abdomen and  pelvis was performed following the standard protocol during bolus  administration of intravenous contrast.  Contrast: 100mL OMNIPAQUE IOHEXOL 300 MG/ML SOLN  Comparison: PET scan 12/11/2012, CT abdomen 12/17/2012, CT thorax  11/25/2012  RECIST protocol 1.1  Target Lesions:  1.Right lower lobe density measures 1.8 cm compared to 2.1 cm on  prior (image 51). This lesion is now enhancing and is adjacent to  the rounded atelectasis.  2. Pretracheal lymph node  measures 1.5 cm compared to 1.9 cm on  prior (image 28).  Non Target lesions:  1. Subcarinal lymph node measures 11 mm compared to 17 mm on prior  (image 34)  2. Right hilar lymph node measures 10 mm compared to 18 mm on  prior (image 32).  3. Anterior (prevascular) lymph node measures 10 mm short axis  compared to 15 mm on prior (image 22).  4. Pleural effusion present.  CT CHEST  Findings: No axillary or supraclavicular lymphadenopathy.  Mediastinal lymphadenopathy is decreased compared to prior. 15 mm  right lower paratracheal node is decreased from 22 mm comparison  PET CT scan. Subcarinal lymph node measures 11 mm (image 34)  decreased from 16 mm on prior. No pericardial fluid.  Review of the lung parenchyma demonstrates a small persistent right  pleural effusion with a small bore chest tube in place. There is a  focus of round atelectasis at the right lung base measuring 4.3 x  2.4 cm which is decreased in volume from 5.0 x 3.2 cm on comparison  PET CT scan. Atelectasis is adjacent to a pleural based metastasis  measuring 17 mm (image 51) which is difficult to see on the  noncontrast PET CT scan. The pleural metastasis seen on comparison  PET CT scan appear reduced   although difficult to appreciate on the  noncontrast CT. Left lung is clear. Paraseptal emphysema is  present.  IMPRESSION:  1. Interval reduction in volume of mediastinal lymphadenopathy.  2. Interval reduction in volume of round atelectasis at the right  lung base with pleural metastasis associated.  3. Pleural based metastasis seen on comparison PET CT scan are  likely reduced.  CT ABDOMEN AND PELVIS  Findings: No focal hepatic lesion. Gallbladder, pancreas, spleen,  adrenal glands, and kidneys are normal.  Stomach, small bowel, and colon are normal.  Abdominal aorta normal caliber. No retroperitoneal periportal  lymphadenopathy. Enlarged gastrohepatic lymph node seen on  comparison PET CT scan difficult to  appreciate but appears smaller  measuring less than 10 mm (image 67).  No free fluid the pelvis. The prostate gland bladder normal. No  pelvic lymphadenopathy.  There is a peritoneal disease or mesenteric disease.  Review of bone windows demonstrates no aggressive osseous lesions.  IMPRESSION:  1. Decrease in the volume of lymphadenopathy in the upper  abdomen/gastrohepatic ligament.  2. No evidence of disease progression in the abdomen or pelvis.  Original Report Authenticated By: Stewart Edmunds, M.D.  *RADIOLOGY REPORT*  Clinical Data: Follow up pleural effusions  CHEST - 2 VIEW 02/12/13  Comparison: 02/01/2013  Findings: Normal heart size. Right basilar chest tube is  identified. No pneumothorax noted. The right pleural effusion is  unchanged in volume from previous exam. New rounded atelectasis in  the right base is better seen on recent CT of the chest. The left  lung is clear. No airspace consolidation identified.  IMPRESSION:  1. Stable position of right chest tube. No change in small right  pleural effusion.  2. No pneumothorax identified peri  Original Report Authenticated By: Taylor Stroud, M.D.  Impression:  The Pleurx catheter has no significant drainage. There is still a small, loculated pleural effusion at the right base but his CT scan shows that the Pleurx catheter is going right through this area. This fluid must be organized. I think it is time to remove the Pleurx catheter.  Plan:  We will schedule removal of the Pleurx catheter in short stay on Thursday, 02/14/2013.     

## 2013-02-14 ENCOUNTER — Encounter (HOSPITAL_COMMUNITY)
Admission: RE | Disposition: A | Discharge status: Home / Self Care | Payer: Self-pay | Source: Ambulatory Visit | Attending: Surgery

## 2013-02-14 ENCOUNTER — Ambulatory Visit (HOSPITAL_COMMUNITY)
Admission: RE | Admit: 2013-02-14 | Discharge: 2013-02-14 | Disposition: A | Discharge status: Home / Self Care | Payer: Medicaid Other | Source: Ambulatory Visit | Attending: Surgery | Admitting: Surgery

## 2013-02-14 DIAGNOSIS — Z4682 Encounter for fitting and adjustment of non-vascular catheter: Secondary | ICD-10-CM | POA: Insufficient documentation

## 2013-02-14 DIAGNOSIS — J90 Pleural effusion, not elsewhere classified: Secondary | ICD-10-CM

## 2013-02-14 HISTORY — PX: REMOVAL OF PLEURAL DRAINAGE CATHETER: SHX5080

## 2013-02-14 SURGERY — REMOVAL, CLOSED DRAINAGE CATHETER SYSTEM, PLEURAL
Anesthesia: LOCAL | Laterality: Right | Wound class: Clean Contaminated

## 2013-02-14 NOTE — Interval H&P Note (Signed)
History and Physical Interval Note:  02/14/2013 9:58 AM  Jay Watts  has presented today for surgery, with the diagnosis of pleural effusion  The various methods of treatment have been discussed with the patient and family. After consideration of risks, benefits and other options for treatment, the patient has consented to  Procedure(s): REMOVAL OF PLEURAL DRAINAGE CATHETER (Right) as a surgical intervention .  The patient's history has been reviewed, patient examined, no change in status, stable for surgery.  I have reviewed the patient's chart and labs.  Questions were answered to the patient's satisfaction.     [name]

## 2013-02-14 NOTE — H&P (Signed)
[address]       [place] [address]             [phone]     HPI:  The patient returns today for followup of his right Pleurx catheter status post insertion on 11/29/2012 for metastatic adenocarcinoma of the lung with malignant right pleural effusion. His catheter was last drained on 02/07/2013 and put out 100 cc for 3 day period. It was drained in the office today and there was minimal output. He is continuing chemotherapy and overall doing well. His last CT scan showed a decrease in size of all the lesions with no sign of progression of disease  Current Outpatient Prescriptions   Medication  Sig  Dispense  Refill   .  lamoTRIgine (LAMICTAL) 150 MG tablet  Take 150 mg by mouth at bedtime.     .  mirtazapine (REMERON) 45 MG tablet  Take 45 mg by mouth at bedtime.     .  ondansetron (ZOFRAN) 4 MG tablet  Take 1 tablet (4 mg total) by mouth every 6 (six) hours as needed for nausea.  20 tablet  2    No current facility-administered medications for this visit.   Physical Exam:  BP 125/77  Pulse 91  Resp 16  Ht 6\' 3"  (1.905 m)  Wt 160 lb 8 oz (72.802 kg)  BMI 20.06 kg/m2  SpO2 98%  He looks well.  Lung exam reveals slight decreased breath sounds at the right base.  The Pleurx catheter site looks good.  Diagnostic Tests:  *RADIOLOGY REPORT*  Clinical Data: Restaging lung cancer recist protocol  CT CHEST, ABDOMEN AND PELVIS WITH CONTRAST  Technique: Multidetector CT imaging of the chest, abdomen and  pelvis was performed following the standard protocol during bolus  administration of intravenous contrast.  Contrast: OMNIPAQUE IOHEXOL 300 MG/ML SOLN  Comparison: PET scan 12/11/2012, CT abdomen 12/17/2012, CT thorax  11/25/2012  RECIST protocol 1.1  Target Lesions:  1.Right lower lobe density measures 1.8 cm compared to 2.1 cm on  prior (image 51). This lesion is now enhancing and is adjacent to  the rounded atelectasis.  2. Pretracheal lymph node  measures 1.5 cm compared to 1.9 cm on  prior (image 28).  Non Target lesions:  1. Subcarinal lymph node measures 11 mm compared to 17 mm on prior  (image 34)  2. Right hilar lymph node measures 10 mm compared to 18 mm on  prior (image 32).  3. Anterior (prevascular) lymph node measures 10 mm short axis  compared to 15 mm on prior (image 22).  4. Pleural effusion present.  CT CHEST  Findings: No axillary or supraclavicular lymphadenopathy.  Mediastinal lymphadenopathy is decreased compared to prior. 15 mm  right lower paratracheal node is decreased from 22 mm comparison  PET CT scan. Subcarinal lymph node measures 11 mm (image 34)  decreased from 16 mm on prior. No pericardial fluid.  Review of the lung parenchyma demonstrates a small persistent right  pleural effusion with a small bore chest tube in place. There is a  focus of round atelectasis at the right lung base measuring 4.3 x  2.4 cm which is decreased in volume from 5.0 x 3.2 cm on comparison  PET CT scan. Atelectasis is adjacent to a pleural based metastasis  measuring 17 mm (image 51) which is difficult to see on the  noncontrast PET CT scan. The pleural metastasis seen on comparison  PET CT scan appear reduced  although difficult to appreciate on the  noncontrast CT. Left lung is clear. Paraseptal emphysema is  present.  IMPRESSION:  1. Interval reduction in volume of mediastinal lymphadenopathy.  2. Interval reduction in volume of round atelectasis at the right  lung base with pleural metastasis associated.  3. Pleural based metastasis seen on comparison PET CT scan are  likely reduced.  CT ABDOMEN AND PELVIS  Findings: No focal hepatic lesion. Gallbladder, pancreas, spleen,  adrenal glands, and kidneys are normal.  Stomach, small bowel, and colon are normal.  Abdominal aorta normal caliber. No retroperitoneal periportal  lymphadenopathy. Enlarged gastrohepatic lymph node seen on  comparison PET CT scan difficult to  appreciate but appears smaller  measuring less than 10 mm (image 67).  No free fluid the pelvis. The prostate gland bladder normal. No  pelvic lymphadenopathy.  There is a peritoneal disease or mesenteric disease.  Review of bone windows demonstrates no aggressive osseous lesions.  IMPRESSION:  1. Decrease in the volume of lymphadenopathy in the upper  abdomen/gastrohepatic ligament.  2. No evidence of disease progression in the abdomen or pelvis.  Original Report Authenticated By: Genevive Bi, M.D.  *RADIOLOGY REPORT*  Clinical Data: Follow up pleural effusions  CHEST - 2 VIEW 02/12/13  Comparison: 02/01/2013  Findings: Normal heart size. Right basilar chest tube is  identified. No pneumothorax noted. The right pleural effusion is  unchanged in volume from previous exam. New rounded atelectasis in  the right base is better seen on recent CT of the chest. The left  lung is clear. No airspace consolidation identified.  IMPRESSION:  1. Stable position of right chest tube. No change in small right  pleural effusion.  2. No pneumothorax identified peri  Original Report Authenticated By: Signa Kell, M.D.  Impression:  The Pleurx catheter has no significant drainage. There is still a small, loculated pleural effusion at the right base but his CT scan shows that the Pleurx catheter is going right through this area. This fluid must be organized. I think it is time to remove the Pleurx catheter.  Plan:  We will schedule removal of the Pleurx catheter in short stay on Thursday, 02/14/2013.

## 2013-02-14 NOTE — Progress Notes (Signed)
PATIENT S/P RT. PLEUREX CATH REMOVAL WITH DR. BARTLE.  PATIENT TOL PROCEDURE WELL.  DRESSING WAS APPLIED .  DR. BARTLE  GAVE POST INSTRUCTIONS VERBALLY TO PATIENT.

## 2013-02-14 NOTE — CV Procedure (Signed)
Removal of right pleurx catheter  Informed consent obtained Sterile prep and drape Time out taken 1% lidocaine local anesthesia  3 cc Cuff mobilized and catheter removed in toto.  Tolerated well.

## 2013-02-17 ENCOUNTER — Encounter (HOSPITAL_COMMUNITY): Payer: Self-pay | Admitting: Surgery

## 2013-02-25 ENCOUNTER — Encounter: Payer: Self-pay | Admitting: *Deleted

## 2013-02-25 ENCOUNTER — Other Ambulatory Visit (HOSPITAL_BASED_OUTPATIENT_CLINIC_OR_DEPARTMENT_OTHER): Payer: Medicaid Other | Admitting: Lab

## 2013-02-25 ENCOUNTER — Encounter: Payer: Self-pay | Admitting: Physician Assistant

## 2013-02-25 ENCOUNTER — Encounter: Payer: Self-pay | Admitting: Internal Medicine

## 2013-02-25 ENCOUNTER — Ambulatory Visit (HOSPITAL_BASED_OUTPATIENT_CLINIC_OR_DEPARTMENT_OTHER): Payer: Medicaid Other | Admitting: Physician Assistant

## 2013-02-25 ENCOUNTER — Telehealth: Payer: Self-pay | Admitting: Internal Medicine

## 2013-02-25 ENCOUNTER — Other Ambulatory Visit: Payer: Self-pay | Admitting: *Deleted

## 2013-02-25 ENCOUNTER — Ambulatory Visit (HOSPITAL_BASED_OUTPATIENT_CLINIC_OR_DEPARTMENT_OTHER): Payer: Medicaid Other

## 2013-02-25 VITALS — BP 117/74 | HR 86 | Temp 98.0°F | Resp 20 | Ht 75.0 in | Wt 163.4 lb

## 2013-02-25 DIAGNOSIS — Z5112 Encounter for antineoplastic immunotherapy: Secondary | ICD-10-CM

## 2013-02-25 DIAGNOSIS — Z5111 Encounter for antineoplastic chemotherapy: Secondary | ICD-10-CM

## 2013-02-25 DIAGNOSIS — C3430 Malignant neoplasm of lower lobe, unspecified bronchus or lung: Secondary | ICD-10-CM

## 2013-02-25 DIAGNOSIS — C3490 Malignant neoplasm of unspecified part of unspecified bronchus or lung: Secondary | ICD-10-CM

## 2013-02-25 DIAGNOSIS — C3491 Malignant neoplasm of unspecified part of right bronchus or lung: Secondary | ICD-10-CM

## 2013-02-25 LAB — COMPREHENSIVE METABOLIC PANEL (CC13)
BUN: 11.1 mg/dL (ref 7.0–26.0)
CO2: 29 mEq/L (ref 22–29)
Calcium: 9.8 mg/dL (ref 8.4–10.4)
Chloride: 102 mEq/L (ref 98–107)
Creatinine: 0.8 mg/dL (ref 0.7–1.3)

## 2013-02-25 LAB — CBC WITH DIFFERENTIAL/PLATELET
Basophils Absolute: 0 10*3/uL (ref 0.0–0.1)
Eosinophils Absolute: 0.1 10*3/uL (ref 0.0–0.5)
HCT: 39.7 % (ref 38.4–49.9)
LYMPH%: 30.7 % (ref 14.0–49.0)
MONO#: 0.5 10*3/uL (ref 0.1–0.9)
NEUT#: 2.2 10*3/uL (ref 1.5–6.5)
NEUT%: 54.5 % (ref 39.0–75.0)
Platelets: 279 10*3/uL (ref 140–400)
WBC: 4.1 10*3/uL (ref 4.0–10.3)

## 2013-02-25 LAB — LACTATE DEHYDROGENASE (CC13): LDH: 142 U/L (ref 125–245)

## 2013-02-25 MED ORDER — DIPHENHYDRAMINE HCL 50 MG/ML IJ SOLN
50.0000 mg | Freq: Once | INTRAMUSCULAR | Status: AC
  Administered 2013-02-25: 50 mg via INTRAVENOUS

## 2013-02-25 MED ORDER — SODIUM CHLORIDE 0.9 % IV SOLN
200.0000 mg/m2 | Freq: Once | INTRAVENOUS | Status: AC
  Administered 2013-02-25: 378 mg via INTRAVENOUS
  Filled 2013-02-25: qty 63

## 2013-02-25 MED ORDER — SODIUM CHLORIDE 0.9 % IV SOLN
Freq: Once | INTRAVENOUS | Status: AC
  Administered 2013-02-25: 11:00:00 via INTRAVENOUS

## 2013-02-25 MED ORDER — FAMOTIDINE IN NACL 20-0.9 MG/50ML-% IV SOLN
20.0000 mg | Freq: Once | INTRAVENOUS | Status: AC
  Administered 2013-02-25: 20 mg via INTRAVENOUS

## 2013-02-25 MED ORDER — DEXAMETHASONE SODIUM PHOSPHATE 4 MG/ML IJ SOLN
20.0000 mg | Freq: Once | INTRAMUSCULAR | Status: AC
  Administered 2013-02-25: 20 mg via INTRAVENOUS

## 2013-02-25 MED ORDER — SODIUM CHLORIDE 0.9 % IV SOLN
780.0000 mg | Freq: Once | INTRAVENOUS | Status: AC
  Administered 2013-02-25: 780 mg via INTRAVENOUS
  Filled 2013-02-25: qty 78

## 2013-02-25 MED ORDER — ONDANSETRON 16 MG/50ML IVPB (CHCC)
16.0000 mg | Freq: Once | INTRAVENOUS | Status: AC
  Administered 2013-02-25: 16 mg via INTRAVENOUS

## 2013-02-25 MED ORDER — SODIUM CHLORIDE 0.9 % IV SOLN
15.0000 mg/kg | Freq: Once | INTRAVENOUS | Status: AC
  Administered 2013-02-25: 1025 mg via INTRAVENOUS
  Filled 2013-02-25: qty 41

## 2013-02-25 NOTE — Progress Notes (Signed)
02/25/13 [id number], cycle 4 Induction: Jay Watts is in the cancer center today accompanied by his parents. He reports he is doing well except for an abscessed tooth. He has been taking antibiotics and is asymptomatic. His appetite is good and he is continuing to work.  Lab results were reviewed by Karrie Doffing, PA. They are WNL for treatment. Discusion with Dr. Arbutus Ped was to continue with today's planned treatment. CT scans will be performed before his next appointment.

## 2013-02-25 NOTE — Patient Instructions (Addendum)
[facility] Discharge Instructions for Patients Receiving Chemotherapy  Today you received the following chemotherapy agents: Taxol, Carboplatin, Avastin.  To help prevent nausea and vomiting after your treatment, we encourage you to take your nausea medication.    If you develop nausea and vomiting that is not controlled by your nausea medication, call the clinic.  BELOW ARE SYMPTOMS THAT SHOULD BE REPORTED IMMEDIATELY:  *FEVER GREATER THAN 100.5 F  *CHILLS WITH OR WITHOUT FEVER  NAUSEA AND VOMITING THAT IS NOT CONTROLLED WITH YOUR NAUSEA MEDICATION  *UNUSUAL SHORTNESS OF BREATH  *UNUSUAL BRUISING OR BLEEDING  TENDERNESS IN MOUTH AND THROAT WITH OR WITHOUT PRESENCE OF ULCERS  *URINARY PROBLEMS  *BOWEL PROBLEMS  UNUSUAL RASH Items with * indicate a potential emergency and should be followed up as soon as possible.  Feel free to call the clinic you have any questions or concerns. The clinic phone number is [phone].

## 2013-02-25 NOTE — Telephone Encounter (Signed)
Gave pt appt for lab and MD on March 2014 lab and MD, gave pt oral contrast  for CT before seeing MD

## 2013-02-25 NOTE — Patient Instructions (Addendum)
Continue with labs per protocol Followup with Dr. Arbutus Ped in 3 weeks with restaging CT scan to reevaluate your disease

## 2013-02-25 NOTE — Progress Notes (Signed)
[facility] [phone]   [phone]  OFFICE PROGRESS NOTE   DIAGNOSIS: Metastatic non-small cell lung cancer, adenocarcinoma with negative EGFR mutation and negative ALK gene translocation diagnosed in December of 2013.   PRIOR THERAPY: Status post Pleurx catheter placement for drainage of right-sided pleural effusion under the care of Dr. Laneta Simmers on 11/29/2012.   CURRENT THERAPY: Systemic chemotherapy with carboplatin for AUC of 6, paclitaxel 200 mg/M2 and Avastin 15 mg/kg every 3 weeks according to the ECoG protocol 5508. Status post 3 cycles.  INTERVAL HISTORY: Jay Watts 52 y.o. male returns to the clinic today for routine followup visit accompanied by his parents. He is currently on a course of clindamycin for a tooth abscess by his dentist, Dr. Carlyon Watts. The tooth apparently will need to be pulled and he was also evaluated by Dr. Raquel James who is an endodontist. He will eventually need to see an oral surgeon to have the tooth extracted because of his jaw structure. He is hoping to have his taking care of once he gets a break from chemotherapy. He is been tolerating his course of chemotherapy with carboplatin, paclitaxel and Avastin without difficulty. He denied any bleeding or bruising.  He denied having any significant fever or chills, no nausea or vomiting, no weight loss or night sweats. The patient denied having any significant peripheral neuropathy. He denied having any significant chest pain, shortness breath, cough or hemoptysis.   MEDICAL HISTORY: Past Medical History  Diagnosis Date  . Bipolar 2 disorder   . Shortness of breath     with exertion  . Pneumothorax     right side- spring 2013  . Lung cancer   . Adenocarcinoma, lung 11/30/2012    ALLERGIES:  has No Known Allergies.  MEDICATIONS:  Current Outpatient Prescriptions  Medication Sig Dispense Refill  . lamoTRIgine (LAMICTAL) 150 MG tablet Take 150 mg by mouth  at bedtime.      . mirtazapine (REMERON) 45 MG tablet Take 45 mg by mouth at bedtime.      . Multiple Vitamin (MULTIVITAMIN WITH MINERALS) TABS Take 1 tablet by mouth daily.       No current facility-administered medications for this visit.    SURGICAL HISTORY:  Past Surgical History  Procedure Laterality Date  . Turbinate reduction    . Chest tube insertion  11/29/2012    Procedure: INSERTION PLEURAL DRAINAGE CATHETER;  Surgeon: [name], MD;  Location: [facility] OR;  Service: Thoracic;  Laterality: Right;  . Removal of pleural drainage catheter Right 02/14/2013    Procedure: REMOVAL OF PLEURAL DRAINAGE CATHETER;  Surgeon: [name], MD;  Location: [facility] OR;  Service: Thoracic;  Laterality: Right;    REVIEW OF SYSTEMS:  A comprehensive review of systems was negative.   PHYSICAL EXAMINATION: General appearance: alert, cooperative and no distress Head: Normocephalic, without obvious abnormality, atraumatic Neck: no adenopathy Lymph nodes: Cervical, supraclavicular, and axillary nodes normal. Resp: clear to auscultation bilaterally Cardio: regular rate and rhythm, S1, S2 normal, no murmur, click, rub or gallop GI: soft, non-tender; bowel sounds normal; no masses,  no organomegaly Extremities: extremities normal, atraumatic, no cyanosis or edema Neurologic: Alert and oriented X 3, normal strength and tone. Normal symmetric reflexes. Normal coordination and gait  ECOG PERFORMANCE STATUS: 0 - Asymptomatic  Blood pressure 117/74, pulse 86, temperature 98 F (36.7 C), temperature source Oral, resp. rate 20, height 6\' 3"  (1.905 m), weight 163 lb 6.4 oz (74.118 kg).  LABORATORY DATA: Lab Results  Component Value Date   WBC 4.1 02/25/2013   HGB 13.7 02/25/2013   HCT 39.7 02/25/2013   MCV 96.4 02/25/2013   PLT 279 02/25/2013      Chemistry      Component Value Date/Time   NA 140 02/25/2013 0931   NA 138 11/26/2012 0500   K 4.2 02/25/2013 0931   K 4.6 11/26/2012 0500   CL 102 02/25/2013  0931   CL 101 11/26/2012 0500   CO2 29 02/25/2013 0931   CO2 27 11/26/2012 0500   BUN 11.1 02/25/2013 0931   BUN 9 11/26/2012 0500   CREATININE 0.8 02/25/2013 0931   CREATININE 0.87 12/01/2012 0545      Component Value Date/Time   CALCIUM 9.8 02/25/2013 0931   CALCIUM 9.0 11/26/2012 0500   ALKPHOS 81 02/25/2013 0931   ALKPHOS 82 11/26/2012 0500   AST 16 02/25/2013 0931   AST 13 11/26/2012 0500   ALT 15 02/25/2013 0931   ALT 9 11/26/2012 0500   BILITOT 0.31 02/25/2013 0931   BILITOT 0.3 11/26/2012 0500       RADIOGRAPHIC STUDIES: Dg Chest 2 View  01/07/2013  *RADIOLOGY REPORT*  Clinical Data: Pleural effusion and shortness of breath.  CHEST - 2 VIEW  Comparison: 11/30/2012.  Findings: Trachea is midline.  Trachea is deviated slightly to the right.  Heart size normal.  Right apical pleural parenchymal scarring.  Small right hydropneumothorax with right chest tube in place at the base of the right hemithorax. Amount of pleural fluid has decreased in the interval from 11/30/2012.  Lungs are emphysematous.  IMPRESSION:  1.  Small right hydropneumothorax with right chest tube in place. Amount of pleural fluid has decreased from 11/30/2012. 2.  Right lower lobe mass is better seen on 12/11/2012.   Original Report Authenticated By: Leanna Battles, M.D.    Ct Chest W Contrast  02/01/2013  *RADIOLOGY REPORT*  Clinical Data:  Restaging lung cancer recist protocol  CT CHEST, ABDOMEN AND PELVIS WITH CONTRAST  Technique:  Multidetector CT imaging of the chest, abdomen and pelvis was performed following the standard protocol during bolus administration of intravenous contrast.  Contrast: OMNIPAQUE IOHEXOL 300 MG/ML  SOLN  Comparison:  PET scan 12/11/2012, CT abdomen 12/17/2012, CT thorax 11/25/2012  RECIST protocol 1.1  Target Lesions:  1.Right lower lobe density measures 1.8 cm compared to 2.1 cm on prior (image 51). This lesion is now enhancing and is adjacent to the rounded atelectasis.  2. Pretracheal lymph  node measures 1.5 cm compared to 1.9 cm on prior (image 28).  Non Target lesions:  1. Subcarinal lymph node measures 11 mm compared to 17 mm on prior (image 34) 2.  Right hilar lymph node measures 10 mm compared to 18 mm on prior (image 32). 3.  Anterior (prevascular) lymph node measures 10 mm short axis compared to 15 mm on prior (image 22). 4.  Pleural effusion present.  CT CHEST  Findings:  No axillary or supraclavicular lymphadenopathy. Mediastinal lymphadenopathy is decreased compared to prior. 15 mm right lower paratracheal node is decreased from 22 mm comparison PET CT scan.  Subcarinal lymph node measures 11 mm (image 34) decreased from 16 mm on prior.  No pericardial fluid.  Review of the lung parenchyma demonstrates a small persistent right pleural effusion with a small bore chest tube in place.  There is a focus of round atelectasis at the right lung base measuring 4.3 x 2.4 cm which is decreased  in volume from 5.0 x 3.2 cm on comparison PET CT scan.  Atelectasis is adjacent to a pleural based metastasis measuring 17 mm (image 51) which is difficult to see on the noncontrast PET CT scan.  The pleural metastasis seen on comparison PET CT scan appear reduced although difficult to appreciate on the noncontrast CT.  Left lung is clear.  Paraseptal emphysema is present.  IMPRESSION:  1.  Interval reduction in volume of mediastinal lymphadenopathy. 2.  Interval reduction in volume of round atelectasis at the right lung base with pleural metastasis associated. 3.  Pleural based metastasis seen on comparison PET CT scan are likely reduced.  CT ABDOMEN AND PELVIS  Findings:  No focal hepatic lesion.  Gallbladder, pancreas, spleen, adrenal glands, and kidneys are normal.  Stomach, small bowel, and colon are normal.  Abdominal aorta normal caliber.  No retroperitoneal periportal lymphadenopathy.  Enlarged gastrohepatic lymph node seen on comparison PET CT scan difficult to appreciate but appears smaller measuring  less than 10 mm (image 67).  No free fluid the pelvis.  The prostate gland bladder normal.  No pelvic lymphadenopathy.  There is a peritoneal disease or mesenteric disease.  Review of  bone windows demonstrates no aggressive osseous lesions.  IMPRESSION:  1.  Decrease in the volume of lymphadenopathy in the upper abdomen/gastrohepatic ligament.  2.  No evidence of disease progression in the abdomen or pelvis.   Original Report Authenticated By: Genevive Bi, M.D.     ASSESSMENT/PLAN: This is a very pleasant 52 years old white male with metastatic non-small cell lung cancer, adenocarcinoma currently on systemic chemotherapy with carboplatin, paclitaxel and Avastin status post 3 cycles with partial response. The patient was discussed with Dr. Arbutus Ped. He will proceed with chemotherapy today with carboplatin, paclitaxel and Avastin according to the ECoG protocol 5508. He will followup with Dr. Arbutus Ped in 3 weeks with restaging CT scan of the chest abdomen and pelvis to reevaluate his disease. Pending the outcome and further randomization after the restaging CT scan the patient may be up to get his tooth taking care of prior to starting the next portion of the protocol. He is to followup with his dentist as needed and inform us if something needs to be done with the tooth sooner.  Amarise Lillo E, PA-C    PLAN: I discussed the scan results with the patient today. I recommended for him to continue treatment with the same regimen carboplatin, paclitaxel and Avastin for 2 more cycles before randomization to maintenance therapy if he has no evidence for disease progression. He was started cycle #3 today. The patient would come back for followup visit in 3 weeks with the start of cycle #4. He was advised to call me immediately if he has any concerning symptoms in the interval.  All questions were answered. The patient knows to call the clinic with any problems, questions or concerns. We can certainly see  the patient much sooner if necessary.  I spent 15 minutes counseling the patient face to face. The total time spent in the appointment was 25 minutes.

## 2013-02-27 ENCOUNTER — Ambulatory Visit: Payer: Self-pay | Admitting: Internal Medicine

## 2013-03-15 ENCOUNTER — Encounter (HOSPITAL_COMMUNITY): Payer: Self-pay

## 2013-03-15 ENCOUNTER — Ambulatory Visit (HOSPITAL_COMMUNITY)
Admission: RE | Admit: 2013-03-15 | Discharge: 2013-03-15 | Disposition: A | Discharge status: Home / Self Care | Payer: Medicaid Other | Source: Ambulatory Visit | Attending: Internal Medicine | Admitting: Internal Medicine

## 2013-03-15 ENCOUNTER — Other Ambulatory Visit (HOSPITAL_BASED_OUTPATIENT_CLINIC_OR_DEPARTMENT_OTHER): Payer: Medicaid Other | Admitting: Lab

## 2013-03-15 DIAGNOSIS — C3491 Malignant neoplasm of unspecified part of right bronchus or lung: Secondary | ICD-10-CM

## 2013-03-15 DIAGNOSIS — J90 Pleural effusion, not elsewhere classified: Secondary | ICD-10-CM | POA: Insufficient documentation

## 2013-03-15 DIAGNOSIS — C782 Secondary malignant neoplasm of pleura: Secondary | ICD-10-CM | POA: Insufficient documentation

## 2013-03-15 DIAGNOSIS — C3430 Malignant neoplasm of lower lobe, unspecified bronchus or lung: Secondary | ICD-10-CM

## 2013-03-15 DIAGNOSIS — C3490 Malignant neoplasm of unspecified part of unspecified bronchus or lung: Secondary | ICD-10-CM | POA: Insufficient documentation

## 2013-03-15 LAB — COMPREHENSIVE METABOLIC PANEL (CC13)
ALT: 13 U/L (ref 0–55)
AST: 17 U/L (ref 5–34)
CO2: 28 mEq/L (ref 22–29)
Sodium: 138 mEq/L (ref 136–145)
Total Bilirubin: 0.34 mg/dL (ref 0.20–1.20)
Total Protein: 7.3 g/dL (ref 6.4–8.3)

## 2013-03-15 LAB — LACTATE DEHYDROGENASE (CC13): LDH: 167 U/L (ref 125–245)

## 2013-03-15 LAB — CBC WITH DIFFERENTIAL/PLATELET
BASO%: 0.7 % (ref 0.0–2.0)
Eosinophils Absolute: 0.1 10*3/uL (ref 0.0–0.5)
MCHC: 34.6 g/dL (ref 32.0–36.0)
MCV: 97.6 fL (ref 79.3–98.0)
MONO#: 0.4 10*3/uL (ref 0.1–0.9)
MONO%: 20.4 % — ABNORMAL HIGH (ref 0.0–14.0)
NEUT#: 0.6 10*3/uL — ABNORMAL LOW (ref 1.5–6.5)
RBC: 4.07 10*6/uL — ABNORMAL LOW (ref 4.20–5.82)
RDW: 18 % — ABNORMAL HIGH (ref 11.0–14.6)
WBC: 2.2 10*3/uL — ABNORMAL LOW (ref 4.0–10.3)

## 2013-03-15 LAB — UA PROTEIN, DIPSTICK - CHCC: Protein, ur: NEGATIVE mg/dL

## 2013-03-15 MED ORDER — IOHEXOL 300 MG/ML  SOLN
100.0000 mL | Freq: Once | INTRAMUSCULAR | Status: AC | PRN
  Administered 2013-03-15: 100 mL via INTRAVENOUS

## 2013-03-18 ENCOUNTER — Encounter: Payer: Self-pay | Admitting: Internal Medicine

## 2013-03-18 ENCOUNTER — Ambulatory Visit (HOSPITAL_BASED_OUTPATIENT_CLINIC_OR_DEPARTMENT_OTHER): Payer: Medicaid Other | Admitting: Internal Medicine

## 2013-03-18 ENCOUNTER — Encounter: Payer: Self-pay | Admitting: *Deleted

## 2013-03-18 VITALS — BP 147/91 | HR 100 | Temp 100.0°F | Resp 20 | Ht 75.0 in | Wt 163.1 lb

## 2013-03-18 DIAGNOSIS — C782 Secondary malignant neoplasm of pleura: Secondary | ICD-10-CM

## 2013-03-18 DIAGNOSIS — C3430 Malignant neoplasm of lower lobe, unspecified bronchus or lung: Secondary | ICD-10-CM

## 2013-03-18 DIAGNOSIS — C3490 Malignant neoplasm of unspecified part of unspecified bronchus or lung: Secondary | ICD-10-CM

## 2013-03-18 DIAGNOSIS — R5381 Other malaise: Secondary | ICD-10-CM

## 2013-03-18 NOTE — Patient Instructions (Signed)
The scan showed continuous improvement in your disease. Followup in few weeks for consideration of randomization to the maintenance phase. Evaluation by your dentist for the tooth abscess.

## 2013-03-18 NOTE — Progress Notes (Signed)
03/18/2013 Completed 4 cycles on E5508 Jay Watts is in the cancer center today for physical exam and discussion of CT scans and maintenance therapy. He was seen by Dr. Arbutus Ped. Scans done 3/28 show continued response. Labs done 3/28 show neutropenia. At this time, the lab result does not meet the eligibility requirements for randomization the step 2, maintenance therapy on E5508. In addition, he has an abscessed tooth he reports needs to be removed. Per Dr. Arbutus Ped, Jay Watts is to make arrangements to have the tooth extracted. He will call me with the date.   We will schedule the next appointments to occur after the tooth is extracted.  Dr. Arbutus Ped is aware of the requirement for randomization to occur within 6 weeks of the last dose of induction chemotherapy.

## 2013-03-18 NOTE — Progress Notes (Signed)
[facility] [phone]   Fax:([phone]) [phone]  OFFICE PROGRESS NOTE  DIAGNOSIS: Metastatic non-small cell lung cancer, adenocarcinoma with negative EGFR mutation and negative ALK gene translocation diagnosed in December of 2013.   PRIOR THERAPY:  1) Status post Pleurx catheter placement for drainage of right-sided pleural effusion under the care of Dr. Laneta Simmers on 11/29/2012.  2) Systemic chemotherapy with carboplatin for AUC of 6, paclitaxel 200 mg/M2 and Avastin 15 mg/kg every 3 weeks according to the ECoG protocol 5508. Status post 4  cycles.  CURRENT THERAPY: None.    INTERVAL HISTORY: Jay Watts 52 y.o. male returns to the clinic today for followup visit. The patient is feeling fine today with no specific complaints. He tolerated the last cycle of his chemotherapy fairly well except for mild fatigue for 3 days after his treatment. He denied having any significant chest pain, shortness breath, cough or hemoptysis. He denied having any nausea or vomiting. He has no peripheral neuropathy. The patient denied having any significant weight loss or night sweats. He has repeat CT scan of the chest, abdomen and pelvis performed recently and he is here for evaluation and discussion of his scan results.  MEDICAL HISTORY: Past Medical History  Diagnosis Date  . Bipolar 2 disorder   . Shortness of breath     with exertion  . Pneumothorax     right side- spring 2013  . Lung cancer   . Adenocarcinoma, lung 11/30/2012    ALLERGIES:  has No Known Allergies.  MEDICATIONS:  Current Outpatient Prescriptions  Medication Sig Dispense Refill  . lamoTRIgine (LAMICTAL) 150 MG tablet Take 150 mg by mouth at bedtime.      . mirtazapine (REMERON) 45 MG tablet Take 45 mg by mouth at bedtime.      . Multiple Vitamin (MULTIVITAMIN WITH MINERALS) TABS Take 1 tablet by mouth daily.       No current facility-administered medications for this visit.    SURGICAL HISTORY:   Past Surgical History  Procedure Laterality Date  . Turbinate reduction    . Chest tube insertion  11/29/2012    Procedure: INSERTION PLEURAL DRAINAGE CATHETER;  Surgeon: [name], MD;  Location: [facility] OR;  Service: Thoracic;  Laterality: Right;  . Removal of pleural drainage catheter Right 02/14/2013    Procedure: REMOVAL OF PLEURAL DRAINAGE CATHETER;  Surgeon: [name], MD;  Location: [facility] OR;  Service: Thoracic;  Laterality: Right;    REVIEW OF SYSTEMS:  A comprehensive review of systems was negative.   PHYSICAL EXAMINATION: General appearance: alert, cooperative and no distress Head: Normocephalic, without obvious abnormality, atraumatic Neck: no adenopathy Lymph nodes: Cervical, supraclavicular, and axillary nodes normal. Resp: clear to auscultation bilaterally Cardio: regular rate and rhythm, S1, S2 normal, no murmur, click, rub or gallop GI: soft, non-tender; bowel sounds normal; no masses,  no organomegaly Extremities: extremities normal, atraumatic, no cyanosis or edema Neurologic: Alert and oriented X 3, normal strength and tone. Normal symmetric reflexes. Normal coordination and gait  ECOG PERFORMANCE STATUS: 0 - Asymptomatic  Blood pressure 147/91, pulse 100, temperature 100 F (37.8 C), temperature source Oral, resp. rate 20, height 6\' 3"  (1.905 m), weight 163 lb 1.6 oz (73.982 kg).  LABORATORY DATA: Lab Results  Component Value Date   WBC 2.2* 03/15/2013   HGB 13.7 03/15/2013   HCT 39.7 03/15/2013   MCV 97.6 03/15/2013   PLT 282 03/15/2013      Chemistry  Component Value Date/Time   NA 138 03/15/2013 0801   NA 138 11/26/2012 0500   K 4.7 03/15/2013 0801   K 4.6 11/26/2012 0500   CL 101 03/15/2013 0801   CL 101 11/26/2012 0500   CO2 28 03/15/2013 0801   CO2 27 11/26/2012 0500   BUN 8.9 03/15/2013 0801   BUN 9 11/26/2012 0500   CREATININE 0.8 03/15/2013 0801   CREATININE 0.87 12/01/2012 0545      Component Value Date/Time   CALCIUM 9.6 03/15/2013 0801    CALCIUM 9.0 11/26/2012 0500   ALKPHOS 75 03/15/2013 0801   ALKPHOS 82 11/26/2012 0500   AST 17 03/15/2013 0801   AST 13 11/26/2012 0500   ALT 13 03/15/2013 0801   ALT 9 11/26/2012 0500   BILITOT 0.34 03/15/2013 0801   BILITOT 0.3 11/26/2012 0500       RADIOGRAPHIC STUDIES: Ct Chest W Contrast  03/15/2013  *RADIOLOGY REPORT*  Clinical Data:  Restaging lung cancer, chemotherapy ongoing  CT CHEST, ABDOMEN AND PELVIS WITH CONTRAST  Technique:  Multidetector CT imaging of the chest, abdomen and pelvis was performed following the standard protocol during bolus administration of intravenous contrast.  Contrast: OMNIPAQUE IOHEXOL 300 MG/ML  SOLN  Comparison:  02/01/2013  ---  RECIST 1.1  Target Lesions:  1. Right lower lobe pleural nodule/opacity - measures approximately 15 mm (series 2/image 49), previously 18 mm, but is now difficult to separate from adjacent rounded atelectasis  2. Pretracheal lymph node - measures 13 mm (series 2/image 26), previously 15 mm  Non-target Lesions:  1. Subcarinal lymph node - present, measures 10 mm short-axis (series 2/image 32), previously 11 mm  2. Right hilar lymph nodes - present, measures 8 mm short-axis (series 2/image 31), previously 10 mm  3. Anterior prevascular lymph node - present, measures 7 mm short- axis (series 2/image 20), previously 10 mm  4. Pleural effusion - present, decreased, right pleural catheter removed  ---   CT CHEST  Findings:  1.5 x 1.0 cm pleural-based metastasis along the right lower lobe (series 2/image 49), previously 1.7 x 1.0 cm.  Adjacent rounded atelectasis measuring 4.3 x 2.2 cm, grossly unchanged.  Trace/small right pleural effusion, partially loculated, decreased with interval removal of right pleural drain.  Moderate paraseptal and centrilobular emphysematous changes. No pneumothorax.  Visualized thyroid is unremarkable.  The heart is normal in size.  Small mediastinal/right hilar lymph nodes, mildly decreased, including: --13 x 12 mm  right paratracheal node (series 2/image 26), previously 16 x 12 mm --8 mm short-axis right hilar node (series 2/image 31), previously 10 mm --10 mm short-axis subcarinal node (series 2/image 32), previously 11 mm  Visualized osseous structures are within normal limits.  IMPRESSION: 1.5 cm pleural-based metastasis along the right lower lobe, mildly decreased.  Adjacent rounded atelectasis, unchanged.  Trace/small right pleural effusion, decreased, with interval removal of right pleural drain.  Small mediastinal/right hilar lymph nodes, mildly decreased.  RECIST 1.1 measurements as above.   CT ABDOMEN AND PELVIS  Findings:  Liver, spleen, pancreas, and adrenal glands are within normal limits.  Gallbladder is underdistended.  No intrahepatic or extrahepatic ductal dilatation.  Kidneys are within normal limits.  No hydronephrosis.  No evidence of bowel obstruction.  Normal appendix.  Atherosclerotic calcifications of the abdominal aorta and branch vessels.  No abdominopelvic ascites.  7 mm short axis gastrohepatic node (series 2/image 65), grossly unchanged.  No additional upper abdominal nodes measuring 7-8 mm short axis (series 2/images 66 and 68).  Prostate is unremarkable.  Bladder is within normal limits.  Mild degenerative changes at L5-S1.  IMPRESSION: No evidence of new/progressive metastatic disease in the abdomen/pelvis.  Stable small upper abdominal lymph nodes measuring 7-8 mm.  RECIST 1.1 measurements as above.   Original Report Authenticated By: Charline Bills, M.D.     ASSESSMENT: This is a very pleasant 52 years old white male with metastatic non-small cell lung cancer status post 4 cycles of systemic chemotherapy with carboplatin, paclitaxel and Avastin according to the ECoG protocol 5508 with continuous improvement in his disease. The patient has tooth absecess that need further evaluation and extraction.   PLAN: I discussed the scan results with the patient today. I recommended for him to see  his dentist for evaluation of the tooth abscess. We will reevaluate the patient in few weeks for consideration of randomization to the maintenance phase of this clinical trial. He was advised to call immediately if he has any concerning symptoms in the interval. All questions were answered. The patient knows to call the clinic with any problems, questions or concerns. We can certainly see the patient much sooner if necessary.  I spent 15 minutes counseling the patient face to face. The total time spent in the appointment was 25 minutes.

## 2013-03-19 ENCOUNTER — Encounter: Payer: Self-pay | Admitting: *Deleted

## 2013-03-29 ENCOUNTER — Other Ambulatory Visit: Payer: Self-pay | Admitting: *Deleted

## 2013-03-29 ENCOUNTER — Telehealth: Payer: Self-pay | Admitting: *Deleted

## 2013-03-29 DIAGNOSIS — C3491 Malignant neoplasm of unspecified part of right bronchus or lung: Secondary | ICD-10-CM

## 2013-03-29 NOTE — Telephone Encounter (Signed)
Mr. Zimmerle notified about lab appointment Monday, 4/14

## 2013-04-01 ENCOUNTER — Other Ambulatory Visit (HOSPITAL_BASED_OUTPATIENT_CLINIC_OR_DEPARTMENT_OTHER): Payer: Medicaid Other | Admitting: Lab

## 2013-04-01 DIAGNOSIS — C3430 Malignant neoplasm of lower lobe, unspecified bronchus or lung: Secondary | ICD-10-CM

## 2013-04-01 DIAGNOSIS — C3491 Malignant neoplasm of unspecified part of right bronchus or lung: Secondary | ICD-10-CM

## 2013-04-01 LAB — CBC WITH DIFFERENTIAL/PLATELET
EOS%: 0.9 % (ref 0.0–7.0)
Eosinophils Absolute: 0 10*3/uL (ref 0.0–0.5)
LYMPH%: 16.8 % (ref 14.0–49.0)
MCH: 34.4 pg — ABNORMAL HIGH (ref 27.2–33.4)
MCV: 99.7 fL — ABNORMAL HIGH (ref 79.3–98.0)
MONO%: 9.8 % (ref 0.0–14.0)
NEUT#: 3.6 10*3/uL (ref 1.5–6.5)
Platelets: 240 10*3/uL (ref 140–400)
RBC: 4.29 10*6/uL (ref 4.20–5.82)

## 2013-04-01 LAB — COMPREHENSIVE METABOLIC PANEL (CC13)
ALT: 22 U/L (ref 0–55)
AST: 35 U/L — ABNORMAL HIGH (ref 5–34)
Albumin: 4.2 g/dL (ref 3.5–5.0)
CO2: 25 mEq/L (ref 22–29)
Calcium: 9.5 mg/dL (ref 8.4–10.4)
Chloride: 101 mEq/L (ref 98–107)
Creatinine: 0.8 mg/dL (ref 0.7–1.3)
Potassium: 4.7 mEq/L (ref 3.5–5.1)
Sodium: 139 mEq/L (ref 136–145)
Total Protein: 7.6 g/dL (ref 6.4–8.3)

## 2013-04-01 LAB — UA PROTEIN, DIPSTICK - CHCC: Protein, ur: NEGATIVE mg/dL

## 2013-04-01 LAB — LACTATE DEHYDROGENASE (CC13): LDH: 172 U/L (ref 125–245)

## 2013-04-03 ENCOUNTER — Other Ambulatory Visit: Payer: Self-pay | Admitting: *Deleted

## 2013-04-05 ENCOUNTER — Telehealth: Payer: Self-pay | Admitting: Internal Medicine

## 2013-04-05 ENCOUNTER — Ambulatory Visit (HOSPITAL_BASED_OUTPATIENT_CLINIC_OR_DEPARTMENT_OTHER): Payer: Medicaid Other

## 2013-04-05 ENCOUNTER — Other Ambulatory Visit: Payer: Self-pay | Admitting: *Deleted

## 2013-04-05 ENCOUNTER — Other Ambulatory Visit: Payer: Medicaid Other | Admitting: Lab

## 2013-04-05 ENCOUNTER — Telehealth: Payer: Self-pay | Admitting: *Deleted

## 2013-04-05 ENCOUNTER — Encounter: Payer: Self-pay | Admitting: Physician Assistant

## 2013-04-05 ENCOUNTER — Ambulatory Visit (HOSPITAL_BASED_OUTPATIENT_CLINIC_OR_DEPARTMENT_OTHER): Payer: Medicaid Other | Admitting: Physician Assistant

## 2013-04-05 ENCOUNTER — Encounter: Payer: Self-pay | Admitting: *Deleted

## 2013-04-05 VITALS — BP 125/78 | HR 89 | Temp 96.9°F | Resp 18 | Ht 75.0 in | Wt 162.3 lb

## 2013-04-05 DIAGNOSIS — C3430 Malignant neoplasm of lower lobe, unspecified bronchus or lung: Secondary | ICD-10-CM

## 2013-04-05 DIAGNOSIS — C3490 Malignant neoplasm of unspecified part of unspecified bronchus or lung: Secondary | ICD-10-CM

## 2013-04-05 DIAGNOSIS — C3491 Malignant neoplasm of unspecified part of right bronchus or lung: Secondary | ICD-10-CM

## 2013-04-05 MED ORDER — CYANOCOBALAMIN 1000 MCG/ML IJ SOLN
1000.0000 ug | Freq: Once | INTRAMUSCULAR | Status: AC
  Administered 2013-04-05: 1000 ug via INTRAMUSCULAR

## 2013-04-05 MED ORDER — FOLIC ACID 1 MG PO TABS: 1.0000 mg | ORAL_TABLET | Freq: Every day | ORAL | Status: DC

## 2013-04-05 MED ORDER — DEXAMETHASONE 4 MG PO TABS: ORAL_TABLET | ORAL | Status: DC

## 2013-04-05 NOTE — Telephone Encounter (Signed)
per [facility] add lab per pof..pt aware

## 2013-04-05 NOTE — Telephone Encounter (Signed)
s.w. pt and advised on 4.28.14 appt....pt ok and aware

## 2013-04-05 NOTE — Telephone Encounter (Signed)
Spoke with Marthann Schiller to inform him of the result of randomization to Step II for [phone]. Informed him he will be receiving alimta. Reinforced the need to start taking the folic acid prescribed today and the decadron the day before, day of, and day after treatment.  Someone will call him next week to tell him what time he is scheduled to begin treatment.

## 2013-04-05 NOTE — Patient Instructions (Addendum)
Follow up per protocol  

## 2013-04-05 NOTE — Telephone Encounter (Signed)
Per staff message and POF I have scheduled appts.  JMW  

## 2013-04-05 NOTE — Progress Notes (Signed)
04/05/13 Patient randomized maintenance therapy,  to Arm B, pemetrexed on the [facility] E5508 Clinical trial.

## 2013-04-09 NOTE — Progress Notes (Addendum)
[facility] [phone]   [phone]  OFFICE PROGRESS NOTE   DIAGNOSIS: Metastatic non-small cell lung cancer, adenocarcinoma with negative EGFR mutation and negative ALK gene translocation diagnosed in December of 2013.   PRIOR THERAPY: Status post Pleurx catheter placement for drainage of right-sided pleural effusion under the care of Dr. Laneta Simmers on 11/29/2012.   CURRENT THERAPY: Systemic chemotherapy with carboplatin for AUC of 6, paclitaxel 200 mg/M2 and Avastin 15 mg/kg every 3 weeks according to the ECoG protocol 5508. Status post 4 cycles.  INTERVAL HISTORY: Jay Watts 52 y.o. male returns to the clinic today for a followup visit to be be randomized for the next phase of the ECOG 5508 protocol. He reports some fatigue but states that he is been working a lot hours lately. He recently took 3 days off and relaxed it feels a lot better. He voiced no other specific complaints. He reports that he has a followup appointment at [facility] behavioral [facility] on 04/11/2013 followup regarding his Remeron and Lamactill prescriptions.  He tolerated his course of chemotherapy with carboplatin, paclitaxel and Avastin without difficulty. He denied any bleeding or bruising.  He denied having any significant fever or chills, no nausea or vomiting, no weight loss or night sweats. The patient denied having any significant peripheral neuropathy. He denied having any significant chest pain, shortness breath, cough or hemoptysis.   MEDICAL HISTORY: Past Medical History  Diagnosis Date  . Bipolar 2 disorder   . Shortness of breath     with exertion  . Pneumothorax     right side- spring 2013  . Lung cancer   . Adenocarcinoma, lung 11/30/2012    ALLERGIES:  has No Known Allergies.  MEDICATIONS:  Current Outpatient Prescriptions  Medication Sig Dispense Refill  . dexamethasone (DECADRON) 4 MG tablet Take  1 tablet by mouth twice a day the day before, the day of  and the day after chemotherapy. Take with food  30 tablet  1  . folic acid (FOLVITE) 1 MG tablet Take 1 tablet (1 mg total) by mouth daily.  30 tablet  3  . lamoTRIgine (LAMICTAL) 150 MG tablet Take 150 mg by mouth at bedtime.      . mirtazapine (REMERON) 45 MG tablet Take 45 mg by mouth at bedtime.      . Multiple Vitamin (MULTIVITAMIN WITH MINERALS) TABS Take 1 tablet by mouth daily.       No current facility-administered medications for this visit.    SURGICAL HISTORY:  Past Surgical History  Procedure Laterality Date  . Turbinate reduction    . Chest tube insertion  11/29/2012    Procedure: INSERTION PLEURAL DRAINAGE CATHETER;  Surgeon: [name], MD;  Location: [facility] OR;  Service: Thoracic;  Laterality: Right;  . Removal of pleural drainage catheter Right 02/14/2013    Procedure: REMOVAL OF PLEURAL DRAINAGE CATHETER;  Surgeon: [name], MD;  Location: [facility] OR;  Service: Thoracic;  Laterality: Right;    REVIEW OF SYSTEMS:  A comprehensive review of systems was negative except for: Constitutional: positive for fatigue   PHYSICAL EXAMINATION: General appearance: alert, cooperative and no distress Head: Normocephalic, without obvious abnormality, atraumatic Neck: no adenopathy Lymph nodes: Cervical, supraclavicular, and axillary nodes normal. Resp: clear to auscultation bilaterally Cardio: regular rate and rhythm, S1, S2 normal, no murmur, click, rub or gallop GI: soft, non-tender; bowel sounds normal; no masses,  no organomegaly Extremities: extremities normal, atraumatic, no cyanosis or edema Neurologic:  Alert and oriented X 3, normal strength and tone. Normal symmetric reflexes. Normal coordination and gait  ECOG PERFORMANCE STATUS: 0 - Asymptomatic  Blood pressure 125/78, pulse 89, temperature 96.9 F (36.1 C), temperature source Oral, resp. rate 18, height 6\' 3"  (1.905 m), weight 162 lb 4.8 oz (73.619 kg).  LABORATORY DATA: Lab Results  Component Value Date   WBC 5.0  04/01/2013   HGB 14.7 04/01/2013   HCT 42.7 04/01/2013   MCV 99.7* 04/01/2013   PLT 240 04/01/2013      Chemistry      Component Value Date/Time   NA 139 04/01/2013 0837   NA 138 11/26/2012 0500   K 4.7 04/01/2013 0837   K 4.6 11/26/2012 0500   CL 101 04/01/2013 0837   CL 101 11/26/2012 0500   CO2 25 04/01/2013 0837   CO2 27 11/26/2012 0500   BUN 8.6 04/01/2013 0837   BUN 9 11/26/2012 0500   CREATININE 0.8 04/01/2013 0837   CREATININE 0.87 12/01/2012 0545      Component Value Date/Time   CALCIUM 9.5 04/01/2013 0837   CALCIUM 9.0 11/26/2012 0500   ALKPHOS 79 04/01/2013 0837   ALKPHOS 82 11/26/2012 0500   AST 35* 04/01/2013 0837   AST 13 11/26/2012 0500   ALT 22 04/01/2013 0837   ALT 9 11/26/2012 0500   BILITOT 0.52 04/01/2013 0837   BILITOT 0.3 11/26/2012 0500       RADIOGRAPHIC STUDIES: Dg Chest 2 View  01/07/2013  *RADIOLOGY REPORT*  Clinical Data: Pleural effusion and shortness of breath.  CHEST - 2 VIEW  Comparison: 11/30/2012.  Findings: Trachea is midline.  Trachea is deviated slightly to the right.  Heart size normal.  Right apical pleural parenchymal scarring.  Small right hydropneumothorax with right chest tube in place at the base of the right hemithorax. Amount of pleural fluid has decreased in the interval from 11/30/2012.  Lungs are emphysematous.  IMPRESSION:  1.  Small right hydropneumothorax with right chest tube in place. Amount of pleural fluid has decreased from 11/30/2012. 2.  Right lower lobe mass is better seen on 12/11/2012.   Original Report Authenticated By: Leanna Battles, M.D.    Ct Chest W Contrast  02/01/2013  *RADIOLOGY REPORT*  Clinical Data:  Restaging lung cancer recist protocol  CT CHEST, ABDOMEN AND PELVIS WITH CONTRAST  Technique:  Multidetector CT imaging of the chest, abdomen and pelvis was performed following the standard protocol during bolus administration of intravenous contrast.  Contrast: OMNIPAQUE IOHEXOL 300 MG/ML  SOLN  Comparison:  PET scan  12/11/2012, CT abdomen 12/17/2012, CT thorax 11/25/2012  RECIST protocol 1.1  Target Lesions:  1.Right lower lobe density measures 1.8 cm compared to 2.1 cm on prior (image 51). This lesion is now enhancing and is adjacent to the rounded atelectasis.  2. Pretracheal lymph node measures 1.5 cm compared to 1.9 cm on prior (image 28).  Non Target lesions:  1. Subcarinal lymph node measures 11 mm compared to 17 mm on prior (image 34) 2.  Right hilar lymph node measures 10 mm compared to 18 mm on prior (image 32). 3.  Anterior (prevascular) lymph node measures 10 mm short axis compared to 15 mm on prior (image 22). 4.  Pleural effusion present.  CT CHEST  Findings:  No axillary or supraclavicular lymphadenopathy. Mediastinal lymphadenopathy is decreased compared to prior. 15 mm right lower paratracheal node is decreased from 22 mm comparison PET CT scan.  Subcarinal lymph node measures 11 mm (image  34) decreased from 16 mm on prior.  No pericardial fluid.  Review of the lung parenchyma demonstrates a small persistent right pleural effusion with a small bore chest tube in place.  There is a focus of round atelectasis at the right lung base measuring 4.3 x 2.4 cm which is decreased in volume from 5.0 x 3.2 cm on comparison PET CT scan.  Atelectasis is adjacent to a pleural based metastasis measuring 17 mm (image 51) which is difficult to see on the noncontrast PET CT scan.  The pleural metastasis seen on comparison PET CT scan appear reduced although difficult to appreciate on the noncontrast CT.  Left lung is clear.  Paraseptal emphysema is present.  IMPRESSION:  1.  Interval reduction in volume of mediastinal lymphadenopathy. 2.  Interval reduction in volume of round atelectasis at the right lung base with pleural metastasis associated. 3.  Pleural based metastasis seen on comparison PET CT scan are likely reduced.  CT ABDOMEN AND PELVIS  Findings:  No focal hepatic lesion.  Gallbladder, pancreas, spleen, adrenal glands,  and kidneys are normal.  Stomach, small bowel, and colon are normal.  Abdominal aorta normal caliber.  No retroperitoneal periportal lymphadenopathy.  Enlarged gastrohepatic lymph node seen on comparison PET CT scan difficult to appreciate but appears smaller measuring less than 10 mm (image 67).  No free fluid the pelvis.  The prostate gland bladder normal.  No pelvic lymphadenopathy.  There is a peritoneal disease or mesenteric disease.  Review of  bone windows demonstrates no aggressive osseous lesions.  IMPRESSION:  1.  Decrease in the volume of lymphadenopathy in the upper abdomen/gastrohepatic ligament.  2.  No evidence of disease progression in the abdomen or pelvis.   Original Report Authenticated By: Genevive Bi, M.D.     ASSESSMENT/PLAN: This is a very pleasant 52 years old white male with metastatic non-small cell lung cancer, adenocarcinoma currently on systemic chemotherapy with carboplatin, paclitaxel and Avastin status post 4 cycles. Patient was discussed with Dr. Darrold Span in Dr. Asa Lente absence. The patient will proceed with randomization according to the ECoG protocol 5508. Arrangements will be made for him to receive a B12 injection as well as a prescription for folic acid. He will followup as per protocol.    Carriann Hesse E, PA-C    He was advised to call  immediately if he has any concerning symptoms in the interval.  All questions were answered. The patient knows to call the clinic with any problems, questions or concerns. We can certainly see the patient much sooner if necessary.  I spent 15 minutes counseling the patient face to face. The total time spent in the appointment was 25 minutes.

## 2013-04-12 ENCOUNTER — Other Ambulatory Visit: Payer: Self-pay | Admitting: Physician Assistant

## 2013-04-15 ENCOUNTER — Encounter: Payer: Self-pay | Admitting: Physician Assistant

## 2013-04-15 ENCOUNTER — Encounter: Payer: Self-pay | Admitting: *Deleted

## 2013-04-15 ENCOUNTER — Ambulatory Visit (HOSPITAL_BASED_OUTPATIENT_CLINIC_OR_DEPARTMENT_OTHER): Payer: Medicaid Other | Admitting: Physician Assistant

## 2013-04-15 ENCOUNTER — Ambulatory Visit (HOSPITAL_BASED_OUTPATIENT_CLINIC_OR_DEPARTMENT_OTHER): Payer: Medicaid Other

## 2013-04-15 ENCOUNTER — Telehealth: Payer: Self-pay | Admitting: Internal Medicine

## 2013-04-15 ENCOUNTER — Other Ambulatory Visit: Payer: Self-pay | Admitting: *Deleted

## 2013-04-15 ENCOUNTER — Encounter: Payer: Self-pay | Admitting: Internal Medicine

## 2013-04-15 VITALS — BP 128/80 | HR 88 | Temp 97.7°F | Resp 20 | Ht 75.0 in | Wt 155.6 lb

## 2013-04-15 DIAGNOSIS — C3491 Malignant neoplasm of unspecified part of right bronchus or lung: Secondary | ICD-10-CM

## 2013-04-15 DIAGNOSIS — C3430 Malignant neoplasm of lower lobe, unspecified bronchus or lung: Secondary | ICD-10-CM

## 2013-04-15 DIAGNOSIS — Z5111 Encounter for antineoplastic chemotherapy: Secondary | ICD-10-CM

## 2013-04-15 DIAGNOSIS — C3490 Malignant neoplasm of unspecified part of unspecified bronchus or lung: Secondary | ICD-10-CM

## 2013-04-15 DIAGNOSIS — C3410 Malignant neoplasm of upper lobe, unspecified bronchus or lung: Secondary | ICD-10-CM

## 2013-04-15 MED ORDER — ONDANSETRON 8 MG/50ML IVPB (CHCC)
8.0000 mg | Freq: Once | INTRAVENOUS | Status: AC
  Administered 2013-04-15: 8 mg via INTRAVENOUS

## 2013-04-15 MED ORDER — SODIUM CHLORIDE 0.9 % IV SOLN
Freq: Once | INTRAVENOUS | Status: AC
  Administered 2013-04-15: 12:00:00 via INTRAVENOUS

## 2013-04-15 MED ORDER — DEXAMETHASONE SODIUM PHOSPHATE 10 MG/ML IJ SOLN
10.0000 mg | Freq: Once | INTRAMUSCULAR | Status: AC
  Administered 2013-04-15: 10 mg via INTRAVENOUS

## 2013-04-15 MED ORDER — SODIUM CHLORIDE 0.9 % IV SOLN
500.0000 mg/m2 | Freq: Once | INTRAVENOUS | Status: AC
  Administered 2013-04-15: 975 mg via INTRAVENOUS
  Filled 2013-04-15: qty 39

## 2013-04-15 NOTE — Patient Instructions (Addendum)
Follow up in 3 weeks per perotocol

## 2013-04-15 NOTE — Progress Notes (Signed)
04/15/13 [facility] Z6109, Maintenance Cycle 1, Alimta Patient in the cancer center today for cycle 1 of maintenance alimta on the E5508 clinical trial. He confirms he has taken the folic acid and decadron as prescribed.  He continues to work long hours.  His appetite has decreased recently.

## 2013-04-15 NOTE — Patient Instructions (Signed)
Cancer Center Discharge Instructions for Patients Receiving Chemotherapy  Today you received the following chemotherapy agents: alimta  To help prevent nausea and vomiting after your treatment, we encourage you to take your nausea medication.  Take it as often as prescribed.    If you develop nausea and vomiting that is not controlled by your nausea medication, call the clinic. If it is after clinic hours your family physician or the after hours number for the clinic or go to the Emergency Department.   BELOW ARE SYMPTOMS THAT SHOULD BE REPORTED IMMEDIATELY:  *FEVER GREATER THAN 100.5 F  *CHILLS WITH OR WITHOUT FEVER  NAUSEA AND VOMITING THAT IS NOT CONTROLLED WITH YOUR NAUSEA MEDICATION  *UNUSUAL SHORTNESS OF BREATH  *UNUSUAL BRUISING OR BLEEDING  TENDERNESS IN MOUTH AND THROAT WITH OR WITHOUT PRESENCE OF ULCERS  *URINARY PROBLEMS  *BOWEL PROBLEMS  UNUSUAL RASH Items with * indicate a potential emergency and should be followed up as soon as possible.  One of the nurses will contact you 24 hours after your treatment. Please let the nurse know about any problems that you may have experienced. Feel free to call the clinic you have any questions or concerns. The clinic phone number is [phone].   I have been informed and understand all the instructions given to me. I know to contact the clinic, my physician, or go to the Emergency Department if any problems should occur. I do not have any questions at this time, but understand that I may call the clinic during office hours   should I have any questions or need assistance in obtaining follow up care.    __________________________________________  _____________  __________ Signature of Patient or Authorized Representative            Date                   Time    __________________________________________ Nurse's Signature   Pemetrexed injection (Alimta) What is this medicine? PEMETREXED (PEM e TREX ed)  is a chemotherapy drug. This medicine affects cells that are rapidly growing, such as cancer cells and cells in your mouth and stomach. It is usually used to treat lung cancers like non-small cell lung cancer and mesothelioma. It may also be used to treat other cancers. This medicine may be used for other purposes; ask your health care provider or pharmacist if you have questions. What should I tell my health care provider before I take this medicine? They need to know if you have any of these conditions: -if you frequently drink alcohol containing beverages -infection (especially a virus infection such as chickenpox, cold sores, or herpes) -kidney disease -liver disease -low blood counts, like low platelets, red bloods, or white blood cells -an unusual or allergic reaction to pemetrexed, mannitol, other medicines, foods, dyes, or preservatives -pregnant or trying to get pregnant -breast-feeding How should I use this medicine? This drug is given as an infusion into a vein. It is administered in a hospital or clinic by a specially trained health care professional. Talk to your pediatrician regarding the use of this medicine in children. Special care may be needed. Overdosage: If you think you have taken too much of this medicine contact a poison control center or emergency room at once. NOTE: This medicine is only for you. Do not share this medicine with others. What if I miss a dose? It is important not to miss your dose. Call your doctor or health care professional if you  are unable to keep an appointment. What may interact with this medicine? -aspirin and aspirin-like medicines -medicines to increase blood counts like filgrastim, pegfilgrastim, sargramostim -methotrexate -NSAIDS, medicines for pain and inflammation, like ibuprofen or naproxen -probenecid -pyrimethamine -vaccines Talk to your doctor or health care professional before taking any of these  medicines: -acetaminophen -aspirin -ibuprofen -ketoprofen -naproxen This list may not describe all possible interactions. Give your health care provider a list of all the medicines, herbs, non-prescription drugs, or dietary supplements you use. Also tell them if you smoke, drink alcohol, or use illegal drugs. Some items may interact with your medicine. What should I watch for while using this medicine? Visit your doctor for checks on your progress. This drug may make you feel generally unwell. This is not uncommon, as chemotherapy can affect healthy cells as well as cancer cells. Report any side effects. Continue your course of treatment even though you feel ill unless your doctor tells you to stop. In some cases, you may be given additional medicines to help with side effects. Follow all directions for their use. Call your doctor or health care professional for advice if you get a fever, chills or sore throat, or other symptoms of a cold or flu. Do not treat yourself. This drug decreases your body's ability to fight infections. Try to avoid being around people who are sick. This medicine may increase your risk to bruise or bleed. Call your doctor or health care professional if you notice any unusual bleeding. Be careful brushing and flossing your teeth or using a toothpick because you may get an infection or bleed more easily. If you have any dental work done, tell your dentist you are receiving this medicine. Avoid taking products that contain aspirin, acetaminophen, ibuprofen, naproxen, or ketoprofen unless instructed by your doctor. These medicines may hide a fever. Call your doctor or health care professional if you get diarrhea or mouth sores. Do not treat yourself. To protect your kidneys, drink water or other fluids as directed while you are taking this medicine. Men and women must use effective birth control while taking this medicine. You may also need to continue using effective birth  control for a time after stopping this medicine. Do not become pregnant while taking this medicine. Tell your doctor right away if you think that you or your partner might be pregnant. There is a potential for serious side effects to an unborn child. Talk to your health care professional or pharmacist for more information. Do not breast-feed an infant while taking this medicine. This medicine may lower sperm counts. What side effects may I notice from receiving this medicine? Side effects that you should report to your doctor or health care professional as soon as possible: -allergic reactions like skin rash, itching or hives, swelling of the face, lips, or tongue -low blood counts - this medicine may decrease the number of white blood cells, red blood cells and platelets. You may be at increased risk for infections and bleeding. -signs of infection - fever or chills, cough, sore throat, pain or difficulty passing urine -signs of decreased platelets or bleeding - bruising, pinpoint red spots on the skin, black, tarry stools, blood in the urine -signs of decreased red blood cells - unusually weak or tired, fainting spells, lightheadedness -breathing problems, like a dry cough -changes in emotions or moods -chest pain -confusion -diarrhea -high blood pressure -mouth or throat sores or ulcers -pain, swelling, warmth in the leg -pain on swallowing -swelling of the ankles,  feet, hands -trouble passing urine or change in the amount of urine -vomiting -yellowing of the eyes or skin Side effects that usually do not require medical attention (report to your doctor or health care professional if they continue or are bothersome): -hair loss -loss of appetite -nausea -stomach upset This list may not describe all possible side effects. Call your doctor for medical advice about side effects. You may report side effects to FDA at 1-800-FDA-1088. Where should I keep my medicine? This drug is given in a  hospital or clinic and will not be stored at home. NOTE: This sheet is a summary. It may not cover all possible information. If you have questions about this medicine, talk to your doctor, pharmacist, or health care provider.  2012, [employer]. (07/08/2008 1:24:03 PM)

## 2013-04-16 ENCOUNTER — Telehealth: Payer: Self-pay | Admitting: *Deleted

## 2013-04-16 NOTE — Telephone Encounter (Signed)
Spoke with [employer].  Says he is doing fairly well. Does not like the steroids because they counteract the Behavior [facility] meds he currently takes.  "I am a little irritable but I have to live with it." Denies any other side effects or symptoms.  Encouraged to drink lots of water and fluids to stay hydrated.  Denies questions at this time.

## 2013-04-16 NOTE — Telephone Encounter (Signed)
Message copied by Augusto Garbe on Tue Apr 16, 2013  4:50 PM ------      Message from: Lorri Frederick      Created: Mon Apr 15, 2013 12:18 PM      Regarding: New chemo - research patient       Alimta ------

## 2013-04-17 NOTE — Progress Notes (Signed)
[facility] Telephone:(336) [phone]   Fax:(336) [phone]  OFFICE PROGRESS NOTE   DIAGNOSIS: Metastatic non-small cell lung cancer, adenocarcinoma with negative EGFR mutation and negative ALK gene translocation diagnosed in December of 2013.   PRIOR THERAPY:  1. Status post Pleurx catheter placement for drainage of right-sided pleural effusion under the care of Dr. Laneta Simmers on 11/29/2012.  2. Systemic chemotherapy with carboplatin for AUC of 6, paclitaxel 200 mg/M2 and Avastin 15 mg/kg every 3 weeks according to the ECoG protocol 5508. Status post 4 cycles.  CURRENT THERAPY: Maintenance chemotherapy according to the ECoG 5508 protocol, patient has been randomized to pemetrexed 500 mg per meter squared given every 3 weeks. First cycle to be given today.  INTERVAL HISTORY: Jay Watts 52 y.o. male returns to the clinic today for a followup visit to proceed with his first cycle of maintenance chemotherapy according to the ECoG 5508 protocol. He has been randomized to receive pemetrexed only. Patient states that he was exposed to a coworker who had some sort of a gastrointestinal bug. He was working very hard on Wednesday and Thursday. He develop diarrhea from Wednesday through Friday with decreased appetite. He reports that Imodium did not help much with the diarrhea. His last episode of diarrhea on Sunday morning. He reports that his appetite is slowly improving. He denied any fever chills associated with his gastrointestinal illness. He voiced no other specific complaints today.  He tolerated his course of chemotherapy with carboplatin, paclitaxel and Avastin without difficulty. He denied any bleeding or bruising.  He denied having any significant fever or chills, no nausea or vomiting, no weight loss or night sweats. The patient denied having any significant peripheral neuropathy. He denied having any significant chest pain, shortness breath, cough or hemoptysis.   MEDICAL  HISTORY: Past Medical History  Diagnosis Date  . Bipolar 2 disorder   . Shortness of breath     with exertion  . Pneumothorax     right side- spring 2013  . Lung cancer   . Adenocarcinoma, lung 11/30/2012    ALLERGIES:  has No Known Allergies.  MEDICATIONS:  Current Outpatient Prescriptions  Medication Sig Dispense Refill  . dexamethasone (DECADRON) 4 MG tablet Take  1 tablet by mouth twice a day the day before, the day of and the day after chemotherapy. Take with food  30 tablet  1  . folic acid (FOLVITE) 1 MG tablet Take 1 tablet (1 mg total) by mouth daily.  30 tablet  3  . lamoTRIgine (LAMICTAL) 150 MG tablet Take 150 mg by mouth at bedtime.      . mirtazapine (REMERON) 45 MG tablet Take 45 mg by mouth at bedtime.      . Multiple Vitamin (MULTIVITAMIN WITH MINERALS) TABS Take 1 tablet by mouth daily.       No current facility-administered medications for this visit.    SURGICAL HISTORY:  Past Surgical History  Procedure Laterality Date  . Turbinate reduction    . Chest tube insertion  11/29/2012    Procedure: INSERTION PLEURAL DRAINAGE CATHETER;  Surgeon: [name], MD;  Location: [facility] OR;  Service: Thoracic;  Laterality: Right;  . Removal of pleural drainage catheter Right 02/14/2013    Procedure: REMOVAL OF PLEURAL DRAINAGE CATHETER;  Surgeon: [name], MD;  Location: [facility] OR;  Service: Thoracic;  Laterality: Right;    REVIEW OF SYSTEMS:  Pertinent items are noted in HPI.   PHYSICAL EXAMINATION: General appearance: alert,  cooperative and no distress Head: Normocephalic, without obvious abnormality, atraumatic Neck: no adenopathy Lymph nodes: Cervical, supraclavicular, and axillary nodes normal. Resp: clear to auscultation bilaterally Cardio: regular rate and rhythm, S1, S2 normal, no murmur, click, rub or gallop GI: soft, non-tender; bowel sounds normal; no masses,  no organomegaly Extremities: extremities normal, atraumatic, no cyanosis or edema Neurologic:  Alert and oriented X 3, normal strength and tone. Normal symmetric reflexes. Normal coordination and gait  ECOG PERFORMANCE STATUS: 0 - Asymptomatic  Blood pressure 128/80, pulse 88, temperature 97.7 F (36.5 C), temperature source Oral, resp. rate 20, height 6\' 3"  (1.905 m), weight 155 lb 9.6 oz (70.58 kg).  LABORATORY DATA: Lab Results  Component Value Date   WBC 5.0 04/01/2013   HGB 14.7 04/01/2013   HCT 42.7 04/01/2013   MCV 99.7* 04/01/2013   PLT 240 04/01/2013      Chemistry      Component Value Date/Time   NA 139 04/01/2013 0837   NA 138 11/26/2012 0500   K 4.7 04/01/2013 0837   K 4.6 11/26/2012 0500   CL 101 04/01/2013 0837   CL 101 11/26/2012 0500   CO2 25 04/01/2013 0837   CO2 27 11/26/2012 0500   BUN 8.6 04/01/2013 0837   BUN 9 11/26/2012 0500   CREATININE 0.8 04/01/2013 0837   CREATININE 0.87 12/01/2012 0545      Component Value Date/Time   CALCIUM 9.5 04/01/2013 0837   CALCIUM 9.0 11/26/2012 0500   ALKPHOS 79 04/01/2013 0837   ALKPHOS 82 11/26/2012 0500   AST 35* 04/01/2013 0837   AST 13 11/26/2012 0500   ALT 22 04/01/2013 0837   ALT 9 11/26/2012 0500   BILITOT 0.52 04/01/2013 0837   BILITOT 0.3 11/26/2012 0500       RADIOGRAPHIC STUDIES: Dg Chest 2 View  01/07/2013  *RADIOLOGY REPORT*  Clinical Data: Pleural effusion and shortness of breath.  CHEST - 2 VIEW  Comparison: 11/30/2012.  Findings: Trachea is midline.  Trachea is deviated slightly to the right.  Heart size normal.  Right apical pleural parenchymal scarring.  Small right hydropneumothorax with right chest tube in place at the base of the right hemithorax. Amount of pleural fluid has decreased in the interval from 11/30/2012.  Lungs are emphysematous.  IMPRESSION:  1.  Small right hydropneumothorax with right chest tube in place. Amount of pleural fluid has decreased from 11/30/2012. 2.  Right lower lobe mass is better seen on 12/11/2012.   Original Report Authenticated By: Leanna Battles, M.D.    Ct Chest W  Contrast  02/01/2013  *RADIOLOGY REPORT*  Clinical Data:  Restaging lung cancer recist protocol  CT CHEST, ABDOMEN AND PELVIS WITH CONTRAST  Technique:  Multidetector CT imaging of the chest, abdomen and pelvis was performed following the standard protocol during bolus administration of intravenous contrast.  Contrast: OMNIPAQUE IOHEXOL 300 MG/ML  SOLN  Comparison:  PET scan 12/11/2012, CT abdomen 12/17/2012, CT thorax 11/25/2012  RECIST protocol 1.1  Target Lesions:  1.Right lower lobe density measures 1.8 cm compared to 2.1 cm on prior (image 51). This lesion is now enhancing and is adjacent to the rounded atelectasis.  2. Pretracheal lymph node measures 1.5 cm compared to 1.9 cm on prior (image 28).  Non Target lesions:  1. Subcarinal lymph node measures 11 mm compared to 17 mm on prior (image 34) 2.  Right hilar lymph node measures 10 mm compared to 18 mm on prior (image 32). 3.  Anterior (prevascular) lymph  node measures 10 mm short axis compared to 15 mm on prior (image 22). 4.  Pleural effusion present.  CT CHEST  Findings:  No axillary or supraclavicular lymphadenopathy. Mediastinal lymphadenopathy is decreased compared to prior. 15 mm right lower paratracheal node is decreased from 22 mm comparison PET CT scan.  Subcarinal lymph node measures 11 mm (image 34) decreased from 16 mm on prior.  No pericardial fluid.  Review of the lung parenchyma demonstrates a small persistent right pleural effusion with a small bore chest tube in place.  There is a focus of round atelectasis at the right lung base measuring 4.3 x 2.4 cm which is decreased in volume from 5.0 x 3.2 cm on comparison PET CT scan.  Atelectasis is adjacent to a pleural based metastasis measuring 17 mm (image 51) which is difficult to see on the noncontrast PET CT scan.  The pleural metastasis seen on comparison PET CT scan appear reduced although difficult to appreciate on the noncontrast CT.  Left lung is clear.  Paraseptal emphysema is  present.  IMPRESSION:  1.  Interval reduction in volume of mediastinal lymphadenopathy. 2.  Interval reduction in volume of round atelectasis at the right lung base with pleural metastasis associated. 3.  Pleural based metastasis seen on comparison PET CT scan are likely reduced.  CT ABDOMEN AND PELVIS  Findings:  No focal hepatic lesion.  Gallbladder, pancreas, spleen, adrenal glands, and kidneys are normal.  Stomach, small bowel, and colon are normal.  Abdominal aorta normal caliber.  No retroperitoneal periportal lymphadenopathy.  Enlarged gastrohepatic lymph node seen on comparison PET CT scan difficult to appreciate but appears smaller measuring less than 10 mm (image 67).  No free fluid the pelvis.  The prostate gland bladder normal.  No pelvic lymphadenopathy.  There is a peritoneal disease or mesenteric disease.  Review of  bone windows demonstrates no aggressive osseous lesions.  IMPRESSION:  1.  Decrease in the volume of lymphadenopathy in the upper abdomen/gastrohepatic ligament.  2.  No evidence of disease progression in the abdomen or pelvis.   Original Report Authenticated By: Genevive Bi, M.D.     ASSESSMENT/PLAN: This is a very pleasant 52 years old white male with metastatic non-small cell lung cancer, adenocarcinoma currently on systemic chemotherapy with carboplatin, paclitaxel and Avastin status post 4 cycles. Patient was discussed with Dr. Arbutus Ped. The patient has been randomized to the maintenance arm of the cognitive 04/23/2007 receiving pemetrexed only. He'll proceed with cycle #1 of his maintenance he chemotherapy with pemetrexed at 500 mg meter square given every 3 weeks. He will followup with Dr. Arbutus Ped in 3 weeks as per protocol.     Jay Hjort E, PA-C    He was advised to call  immediately if he has any concerning symptoms in the interval.  All questions were answered. The patient knows to call the clinic with any problems, questions or concerns. We can certainly see  the patient much sooner if necessary.  I spent 20 minutes counseling the patient face to face. The total time spent in the appointment was 30 minutes.

## 2013-04-29 ENCOUNTER — Encounter (INDEPENDENT_AMBULATORY_CARE_PROVIDER_SITE_OTHER): Payer: Self-pay | Admitting: General Surgery

## 2013-04-29 ENCOUNTER — Ambulatory Visit (INDEPENDENT_AMBULATORY_CARE_PROVIDER_SITE_OTHER): Payer: Medicaid Other | Admitting: General Surgery

## 2013-04-29 VITALS — BP 122/64 | HR 76 | Temp 97.8°F | Resp 16 | Ht 75.0 in | Wt 161.2 lb

## 2013-04-29 DIAGNOSIS — K612 Anorectal abscess: Secondary | ICD-10-CM

## 2013-04-29 DIAGNOSIS — K610 Anal abscess: Secondary | ICD-10-CM

## 2013-04-29 MED ORDER — OXYCODONE-ACETAMINOPHEN 10-325 MG PO TABS: 1.0000 | ORAL_TABLET | Freq: Four times a day (QID) | ORAL | Status: DC | PRN

## 2013-04-29 MED ORDER — SULFAMETHOXAZOLE-TRIMETHOPRIM 800-160 MG PO TABS: 1.0000 | ORAL_TABLET | Freq: Two times a day (BID) | ORAL | Status: AC

## 2013-04-29 NOTE — Progress Notes (Signed)
Subjective:     Patient ID: Jay Watts, male   DOB: 04/23/1961, 52 y.o.   MRN: [id number]  HPI The patient is a 52 year old male with approximately a 3-4 day history  anal pain. Patient was seen by his PCP and was referred for surgical evaluation.  The patient states he's had no fever while at home. He's had a previous history of thrombosed hemorrhoids that required excision.  Review of Systems  Constitutional: Negative.   HENT: Negative.   Eyes: Negative.   Respiratory: Negative.   Cardiovascular: Negative.   Gastrointestinal: Negative.   Neurological: Negative.        Objective:   Physical Exam  Constitutional: He is oriented to person, place, and time. He appears well-developed and well-nourished.  HENT:  Head: Normocephalic and atraumatic.  Eyes: Conjunctivae and EOM are normal. Pupils are equal, round, and reactive to light.  Neck: Normal range of motion. Neck supple.  Cardiovascular: Normal rate, regular rhythm and normal heart sounds.   Pulmonary/Chest: Effort normal and breath sounds normal.  Abdominal: Soft. Bowel sounds are normal.  Genitourinary: Penis normal.     Musculoskeletal: Normal range of motion.  Neurological: He is alert and oriented to person, place, and time.   Procedure: The area of fluctuance was incised and drained with approximately 5-10 cc of purulence.   The area was packed with quarter-inch iodoform gauze.      Assessment:     52 year old male with anal abscess    Plan:     1. The patient will be prescribed Bactrim for 7-10 days,, and also a prescription for pain medication 2. I instructed the patient to remove the iodoform gauze tomorrow, and proceed with twice-daily tub soaks.  3. Patient followup in 1-2 weeks.

## 2013-04-30 ENCOUNTER — Encounter (INDEPENDENT_AMBULATORY_CARE_PROVIDER_SITE_OTHER): Payer: Self-pay

## 2013-05-06 ENCOUNTER — Ambulatory Visit (HOSPITAL_BASED_OUTPATIENT_CLINIC_OR_DEPARTMENT_OTHER): Payer: Medicaid Other

## 2013-05-06 ENCOUNTER — Ambulatory Visit (HOSPITAL_BASED_OUTPATIENT_CLINIC_OR_DEPARTMENT_OTHER): Payer: Medicaid Other | Admitting: Physician Assistant

## 2013-05-06 ENCOUNTER — Encounter: Payer: Self-pay | Admitting: Physician Assistant

## 2013-05-06 ENCOUNTER — Telehealth: Payer: Self-pay | Admitting: Internal Medicine

## 2013-05-06 ENCOUNTER — Encounter: Payer: Self-pay | Admitting: *Deleted

## 2013-05-06 ENCOUNTER — Other Ambulatory Visit (HOSPITAL_BASED_OUTPATIENT_CLINIC_OR_DEPARTMENT_OTHER): Payer: Medicaid Other | Admitting: Lab

## 2013-05-06 VITALS — BP 110/74 | HR 82 | Temp 97.9°F | Resp 18 | Ht 75.0 in | Wt 158.7 lb

## 2013-05-06 DIAGNOSIS — C3491 Malignant neoplasm of unspecified part of right bronchus or lung: Secondary | ICD-10-CM

## 2013-05-06 DIAGNOSIS — C3490 Malignant neoplasm of unspecified part of unspecified bronchus or lung: Secondary | ICD-10-CM

## 2013-05-06 DIAGNOSIS — C3430 Malignant neoplasm of lower lobe, unspecified bronchus or lung: Secondary | ICD-10-CM

## 2013-05-06 DIAGNOSIS — Z5111 Encounter for antineoplastic chemotherapy: Secondary | ICD-10-CM

## 2013-05-06 LAB — CBC WITH DIFFERENTIAL/PLATELET
Basophils Absolute: 0 10*3/uL (ref 0.0–0.1)
EOS%: 0.6 % (ref 0.0–7.0)
Eosinophils Absolute: 0.1 10*3/uL (ref 0.0–0.5)
HCT: 34.2 % — ABNORMAL LOW (ref 38.4–49.9)
HGB: 11.5 g/dL — ABNORMAL LOW (ref 13.0–17.1)
MCH: 33.5 pg — ABNORMAL HIGH (ref 27.2–33.4)
NEUT%: 70.6 % (ref 39.0–75.0)
lymph#: 2.2 10*3/uL (ref 0.9–3.3)

## 2013-05-06 LAB — COMPREHENSIVE METABOLIC PANEL (CC13)
ALT: 35 U/L (ref 0–55)
AST: 34 U/L (ref 5–34)
Alkaline Phosphatase: 81 U/L (ref 40–150)
BUN: 7.4 mg/dL (ref 7.0–26.0)
Creatinine: 0.8 mg/dL (ref 0.7–1.3)
Total Bilirubin: <0.2 mg/dL (ref 0.20–1.20)

## 2013-05-06 LAB — RESEARCH LABS

## 2013-05-06 LAB — LACTATE DEHYDROGENASE (CC13): LDH: 193 U/L (ref 125–245)

## 2013-05-06 MED ORDER — DEXAMETHASONE SODIUM PHOSPHATE 10 MG/ML IJ SOLN
10.0000 mg | Freq: Once | INTRAMUSCULAR | Status: AC
  Administered 2013-05-06: 10 mg via INTRAVENOUS

## 2013-05-06 MED ORDER — ONDANSETRON 8 MG/50ML IVPB (CHCC)
8.0000 mg | Freq: Once | INTRAVENOUS | Status: AC
  Administered 2013-05-06: 8 mg via INTRAVENOUS

## 2013-05-06 MED ORDER — SODIUM CHLORIDE 0.9 % IV SOLN
500.0000 mg/m2 | Freq: Once | INTRAVENOUS | Status: AC
  Administered 2013-05-06: 975 mg via INTRAVENOUS
  Filled 2013-05-06: qty 39

## 2013-05-06 MED ORDER — SODIUM CHLORIDE 0.9 % IV SOLN
Freq: Once | INTRAVENOUS | Status: AC
  Administered 2013-05-06: 10:00:00 via INTRAVENOUS

## 2013-05-06 NOTE — Patient Instructions (Addendum)
Followup in 3 weeks prior to your next scheduled cycle of maintenance chemotherapy 

## 2013-05-06 NOTE — Patient Instructions (Addendum)
[facility] Discharge Instructions for Patients Receiving Chemotherapy  Today you received the following chemotherapy agents Alimta.  To help prevent nausea and vomiting after your treatment, we encourage you to take your nausea medication as prescribed.   If you develop nausea and vomiting that is not controlled by your nausea medication, call the clinic. If it is after clinic hours your family physician or the after hours number for the clinic or go to the Emergency Department.   BELOW ARE SYMPTOMS THAT SHOULD BE REPORTED IMMEDIATELY:  *FEVER GREATER THAN 100.5 F  *CHILLS WITH OR WITHOUT FEVER  NAUSEA AND VOMITING THAT IS NOT CONTROLLED WITH YOUR NAUSEA MEDICATION  *UNUSUAL SHORTNESS OF BREATH  *UNUSUAL BRUISING OR BLEEDING  TENDERNESS IN MOUTH AND THROAT WITH OR WITHOUT PRESENCE OF ULCERS  *URINARY PROBLEMS  *BOWEL PROBLEMS  UNUSUAL RASH Items with * indicate a potential emergency and should be followed up as soon as possible.  Feel free to call the clinic you have any questions or concerns. The clinic phone number is [phone].   I have been informed and understand all the instructions given to me. I know to contact the clinic, my physician, or go to the Emergency Department if any problems should occur. I do not have any questions at this time, but understand that I may call the clinic during office hours   should I have any questions or need assistance in obtaining follow up care.    __________________________________________  _____________  __________ Signature of Patient or Authorized Representative            Date                   Time    __________________________________________ Nurse's Signature    

## 2013-05-06 NOTE — Telephone Encounter (Signed)
gv and printeda ppt sched and avs for pt....emailed MW to add tx...pt ok and aware

## 2013-05-06 NOTE — Progress Notes (Signed)
05/06/13 E508 Maintenance Patient in the cancer center today for lab work, physical exam, and treatment on E5508. He reports having a hemorrhoid excised in the doctor's ffice last Monday. He did require antibiotics.  Surgeon's note reviewed with PA and MD.  Lab results reviewed. WNL for treatment.  Sign for infusion given to D. Burleson, Charity fundraiser.

## 2013-05-06 NOTE — Progress Notes (Signed)
[facility] [phone]   [phone]  OFFICE PROGRESS NOTE   DIAGNOSIS: Metastatic non-small cell lung cancer, adenocarcinoma with negative EGFR mutation and negative ALK gene translocation diagnosed in December of 2013.   PRIOR THERAPY:  1. Status post Pleurx catheter placement for drainage of right-sided pleural effusion under the care of Dr. Laneta Simmers on 11/29/2012.  2. Systemic chemotherapy with carboplatin for AUC of 6, paclitaxel 200 mg/M2 and Avastin 15 mg/kg every 3 weeks according to the ECoG protocol 5508. Status post 4 cycles.  CURRENT THERAPY: Maintenance chemotherapy according to the ECoG 5508 protocol, patient has been randomized to pemetrexed 500 mg per meter squared given every 3 weeks. Status post 1 cycle  INTERVAL HISTORY: Jay Watts 52 y.o. male returns to the clinic today for a followup visit to proceed with his first cycle of maintenance chemotherapy according to the ECoG 5508 protocol. He has been randomized to receive pemetrexed only, status post 1 cycle. He reports that for approximately week after chemotherapy he didn't feel well. He feels that this is secondary to the steroids. He feels significantly more irritable and a bit volatile in their aftermath. He is status post anal abscess I&D on 04/29/2013. He was placed on a seven-day course of Bactrim and is just about finished with a course of antibiotics. He denied any fever any further pain. He voiced no other specific complaints.  He denied any bleeding or bruising.  He denied having any significant fever or chills, no nausea or vomiting, no weight loss or night sweats. The patient denied having any significant peripheral neuropathy. He denied having any significant chest pain, shortness breath, cough or hemoptysis.   MEDICAL HISTORY: Past Medical History  Diagnosis Date  . Bipolar 2 disorder   . Shortness of breath     with exertion  . Pneumothorax     right side- spring  2013  . Lung cancer   . Adenocarcinoma, lung 11/30/2012    ALLERGIES:  has No Known Allergies.  MEDICATIONS:  Current Outpatient Prescriptions  Medication Sig Dispense Refill  . lamoTRIgine (LAMICTAL) 150 MG tablet Take 150 mg by mouth at bedtime.      . mirtazapine (REMERON) 45 MG tablet Take 45 mg by mouth at bedtime.      . Multiple Vitamin (MULTIVITAMIN WITH MINERALS) TABS Take 1 tablet by mouth daily.      Marland Kitchen oxyCODONE-acetaminophen (PERCOCET) 10-325 MG per tablet Take 1 tablet by mouth every 6 (six) hours as needed for pain.  20 tablet  0  . sulfamethoxazole-trimethoprim (BACTRIM DS) 800-160 MG per tablet Take 1 tablet by mouth 2 (two) times daily.  20 tablet  0   No current facility-administered medications for this visit.    SURGICAL HISTORY:  Past Surgical History  Procedure Laterality Date  . Turbinate reduction    . Chest tube insertion  11/29/2012    Procedure: INSERTION PLEURAL DRAINAGE CATHETER;  Surgeon: [name], MD;  Location: [facility] OR;  Service: Thoracic;  Laterality: Right;  . Removal of pleural drainage catheter Right 02/14/2013    Procedure: REMOVAL OF PLEURAL DRAINAGE CATHETER;  Surgeon: [name], MD;  Location: [facility] OR;  Service: Thoracic;  Laterality: Right;    REVIEW OF SYSTEMS:  Pertinent items are noted in HPI.   PHYSICAL EXAMINATION: General appearance: alert, cooperative and no distress Head: Normocephalic, without obvious abnormality, atraumatic Neck: no adenopathy Lymph nodes: Cervical, supraclavicular, and axillary nodes normal. Resp: clear to auscultation  bilaterally Cardio: regular rate and rhythm, S1, S2 normal, no murmur, click, rub or gallop GI: soft, non-tender; bowel sounds normal; no masses,  no organomegaly Extremities: extremities normal, atraumatic, no cyanosis or edema Neurologic: Alert and oriented X 3, normal strength and tone. Normal symmetric reflexes. Normal coordination and gait  ECOG PERFORMANCE STATUS: 0 -  Asymptomatic  Blood pressure 110/74, pulse 82, temperature 97.9 F (36.6 C), temperature source Oral, resp. rate 18, height 6\' 3"  (1.905 m), weight 158 lb 11.2 oz (71.986 kg).  LABORATORY DATA: Lab Results  Component Value Date   WBC 10.6* 05/06/2013   HGB 11.5* 05/06/2013   HCT 34.2* 05/06/2013   MCV 99.7* 05/06/2013   PLT 675 confirmed* 05/06/2013      Chemistry      Component Value Date/Time   NA 134* 05/06/2013 0843   NA 138 11/26/2012 0500   K 4.2 05/06/2013 0843   K 4.6 11/26/2012 0500   CL 97* 05/06/2013 0843   CL 101 11/26/2012 0500   CO2 28 05/06/2013 0843   CO2 27 11/26/2012 0500   BUN 7.4 05/06/2013 0843   BUN 9 11/26/2012 0500   CREATININE 0.8 05/06/2013 0843   CREATININE 0.87 12/01/2012 0545      Component Value Date/Time   CALCIUM 9.1 05/06/2013 0843   CALCIUM 9.0 11/26/2012 0500   ALKPHOS 81 05/06/2013 0843   ALKPHOS 82 11/26/2012 0500   AST 34 05/06/2013 0843   AST 13 11/26/2012 0500   ALT 35 05/06/2013 0843   ALT 9 11/26/2012 0500   BILITOT <0.20 Repeated and Verified 05/06/2013 0843   BILITOT 0.3 11/26/2012 0500       RADIOGRAPHIC STUDIES: Dg Chest 2 View  01/07/2013  *RADIOLOGY REPORT*  Clinical Data: Pleural effusion and shortness of breath.  CHEST - 2 VIEW  Comparison: 11/30/2012.  Findings: Trachea is midline.  Trachea is deviated slightly to the right.  Heart size normal.  Right apical pleural parenchymal scarring.  Small right hydropneumothorax with right chest tube in place at the base of the right hemithorax. Amount of pleural fluid has decreased in the interval from 11/30/2012.  Lungs are emphysematous.  IMPRESSION:  1.  Small right hydropneumothorax with right chest tube in place. Amount of pleural fluid has decreased from 11/30/2012. 2.  Right lower lobe mass is better seen on 12/11/2012.   Original Report Authenticated By: Leanna Battles, M.D.    Ct Chest W Contrast  02/01/2013  *RADIOLOGY REPORT*  Clinical Data:  Restaging lung cancer recist protocol  CT CHEST,  ABDOMEN AND PELVIS WITH CONTRAST  Technique:  Multidetector CT imaging of the chest, abdomen and pelvis was performed following the standard protocol during bolus administration of intravenous contrast.  Contrast: OMNIPAQUE IOHEXOL 300 MG/ML  SOLN  Comparison:  PET scan 12/11/2012, CT abdomen 12/17/2012, CT thorax 11/25/2012  RECIST protocol 1.1  Target Lesions:  1.Right lower lobe density measures 1.8 cm compared to 2.1 cm on prior (image 51). This lesion is now enhancing and is adjacent to the rounded atelectasis.  2. Pretracheal lymph node measures 1.5 cm compared to 1.9 cm on prior (image 28).  Non Target lesions:  1. Subcarinal lymph node measures 11 mm compared to 17 mm on prior (image 34) 2.  Right hilar lymph node measures 10 mm compared to 18 mm on prior (image 32). 3.  Anterior (prevascular) lymph node measures 10 mm short axis compared to 15 mm on prior (image 22). 4.  Pleural effusion present.  CT  CHEST  Findings:  No axillary or supraclavicular lymphadenopathy. Mediastinal lymphadenopathy is decreased compared to prior. 15 mm right lower paratracheal node is decreased from 22 mm comparison PET CT scan.  Subcarinal lymph node measures 11 mm (image 34) decreased from 16 mm on prior.  No pericardial fluid.  Review of the lung parenchyma demonstrates a small persistent right pleural effusion with a small bore chest tube in place.  There is a focus of round atelectasis at the right lung base measuring 4.3 x 2.4 cm which is decreased in volume from 5.0 x 3.2 cm on comparison PET CT scan.  Atelectasis is adjacent to a pleural based metastasis measuring 17 mm (image 51) which is difficult to see on the noncontrast PET CT scan.  The pleural metastasis seen on comparison PET CT scan appear reduced although difficult to appreciate on the noncontrast CT.  Left lung is clear.  Paraseptal emphysema is present.  IMPRESSION:  1.  Interval reduction in volume of mediastinal lymphadenopathy. 2.  Interval reduction  in volume of round atelectasis at the right lung base with pleural metastasis associated. 3.  Pleural based metastasis seen on comparison PET CT scan are likely reduced.  CT ABDOMEN AND PELVIS  Findings:  No focal hepatic lesion.  Gallbladder, pancreas, spleen, adrenal glands, and kidneys are normal.  Stomach, small bowel, and colon are normal.  Abdominal aorta normal caliber.  No retroperitoneal periportal lymphadenopathy.  Enlarged gastrohepatic lymph node seen on comparison PET CT scan difficult to appreciate but appears smaller measuring less than 10 mm (image 67).  No free fluid the pelvis.  The prostate gland bladder normal.  No pelvic lymphadenopathy.  There is a peritoneal disease or mesenteric disease.  Review of  bone windows demonstrates no aggressive osseous lesions.  IMPRESSION:  1.  Decrease in the volume of lymphadenopathy in the upper abdomen/gastrohepatic ligament.  2.  No evidence of disease progression in the abdomen or pelvis.   Original Report Authenticated By: Genevive Bi, M.D.     ASSESSMENT/PLAN: This is a very pleasant 52 years old white male with metastatic non-small cell lung cancer, adenocarcinoma currently on systemic chemotherapy with carboplatin, paclitaxel and Avastin status post 4 cycles. Patient was discussed with Dr. Arbutus Ped. The patient has been randomized to the maintenance arm of the cognitive 04/23/2007 receiving pemetrexed only, status post 1 cycle. He'll proceed with cycle #2 of his maintenance he chemotherapy with pemetrexed at 500 mg meter square given every 3 weeks. He will followup in 3 weeks as per protocol.   We'll consult protocol regarding whether the dexamethasone is mandatory. He continues to have significant personality changes related to the dexamethasone, we may need to the very least discontinue the oral dexamethasone and perhaps decrease the dose of the IV dexamethasone.  Glennice Marcos E, PA-C    He was advised to call  immediately if he has any  concerning symptoms in the interval.  All questions were answered. The patient knows to call the clinic with any problems, questions or concerns. We can certainly see the patient much sooner if necessary.  I spent 20 minutes counseling the patient face to face. The total time spent in the appointment was 30 minutes.

## 2013-05-09 ENCOUNTER — Encounter (INDEPENDENT_AMBULATORY_CARE_PROVIDER_SITE_OTHER): Payer: Medicaid Other | Admitting: General Surgery

## 2013-05-17 ENCOUNTER — Telehealth: Payer: Self-pay | Admitting: *Deleted

## 2013-05-17 ENCOUNTER — Emergency Department (HOSPITAL_COMMUNITY)
Admission: EM | Admit: 2013-05-17 | Discharge: 2013-05-17 | Disposition: A | Discharge status: Home / Self Care | Payer: Medicaid Other | Attending: Emergency Medicine | Admitting: Emergency Medicine

## 2013-05-17 ENCOUNTER — Encounter (HOSPITAL_COMMUNITY): Payer: Self-pay | Admitting: *Deleted

## 2013-05-17 DIAGNOSIS — Z79899 Other long term (current) drug therapy: Secondary | ICD-10-CM | POA: Insufficient documentation

## 2013-05-17 DIAGNOSIS — Z87891 Personal history of nicotine dependence: Secondary | ICD-10-CM | POA: Insufficient documentation

## 2013-05-17 DIAGNOSIS — Z8709 Personal history of other diseases of the respiratory system: Secondary | ICD-10-CM | POA: Insufficient documentation

## 2013-05-17 DIAGNOSIS — F319 Bipolar disorder, unspecified: Secondary | ICD-10-CM | POA: Insufficient documentation

## 2013-05-17 DIAGNOSIS — R040 Epistaxis: Secondary | ICD-10-CM

## 2013-05-17 DIAGNOSIS — C3490 Malignant neoplasm of unspecified part of unspecified bronchus or lung: Secondary | ICD-10-CM | POA: Insufficient documentation

## 2013-05-17 LAB — BASIC METABOLIC PANEL
BUN: 11 mg/dL (ref 6–23)
CO2: 27 mEq/L (ref 19–32)
Calcium: 8.9 mg/dL (ref 8.4–10.5)
Creatinine, Ser: 0.53 mg/dL (ref 0.50–1.35)
GFR calc non Af Amer: >90 mL/min (ref >90)
Glucose, Bld: 83 mg/dL (ref 70–99)

## 2013-05-17 LAB — CBC WITH DIFFERENTIAL/PLATELET
Basophils Absolute: 0 10*3/uL (ref 0.0–0.1)
Basophils Relative: 0 % (ref 0–1)
Eosinophils Absolute: 0 10*3/uL (ref 0.0–0.7)
Eosinophils Relative: 0 % (ref 0–5)
Hemoglobin: 10.5 g/dL — ABNORMAL LOW (ref 13.0–17.0)
Lymphocytes Relative: 68 % — ABNORMAL HIGH (ref 12–46)
Lymphs Abs: 2.5 10*3/uL (ref 0.7–4.0)
MCH: 34 pg (ref 26.0–34.0)
Monocytes Absolute: 0.1 10*3/uL (ref 0.1–1.0)
Monocytes Relative: 2 % — ABNORMAL LOW (ref 3–12)
Myelocytes: 0 %
Neutro Abs: 1.1 10*3/uL — ABNORMAL LOW (ref 1.7–7.7)
Neutrophils Relative %: 27 % — ABNORMAL LOW (ref 43–77)
Platelets: 217 10*3/uL (ref 150–400)
RBC: 3.09 MIL/uL — ABNORMAL LOW (ref 4.22–5.81)
WBC: 3.7 10*3/uL — ABNORMAL LOW (ref 4.0–10.5)
nRBC: 0 /100 WBC

## 2013-05-17 LAB — PROTIME-INR: INR: 0.95 (ref 0.00–1.49)

## 2013-05-17 MED ORDER — OXYMETAZOLINE HCL 0.05 % NA SOLN
1.0000 | Freq: Once | NASAL | Status: AC
  Administered 2013-05-17: 1 via NASAL
  Filled 2013-05-17: qty 15

## 2013-05-17 MED ORDER — SODIUM CHLORIDE 0.9 % IV SOLN
Freq: Once | INTRAVENOUS | Status: AC
  Administered 2013-05-17: 13:00:00 via INTRAVENOUS

## 2013-05-17 NOTE — ED Notes (Signed)
1 rhino rocket placed in each nare by MD Lynelle Doctor

## 2013-05-17 NOTE — ED Notes (Signed)
Pt reports onset of severe nose bleed that began this morning, blood from left and right nostrils, lasted 3 hours. States blood was coming out of left eye as well. Pt tried packing nose at home with toilet paper without much relief. Stage 4 lung cancer pt, started new medication, has gotten more frequent nose bleeds since. Pt reports being in clinical trial for ca. Was advised to come into ED. Bleeding currently under control. Not on any anticoagulants

## 2013-05-17 NOTE — ED Provider Notes (Signed)
History    CSN: [id number] Arrival date & time 05/17/13  1157 First MD Initiated Contact with Patient 05/17/13 1231      Chief Complaint  Patient presents with  . Epistaxis    HPI Patient presents to the emergency room with complaints of bilateral epistaxis. Patient has history of lung cancer. He is undergoing chemotherapy treatments. This morning he started having a nosebleed that persisted for the last 3 hours. He attempted to play nasal pressure but the bleeding persisted. Patient has not had any trouble with lightheadedness or weakness. He has not had any issues with bleeding other than his nosebleed. He has had some complications from his recent chemotherapy treatment including a rash. He has not noticed any blood in his stool. He has not had any vomiting or fever. Past Medical History  Diagnosis Date  . Bipolar 2 disorder   . Shortness of breath     with exertion  . Pneumothorax     right side- spring 2013  . Lung cancer   . Adenocarcinoma, lung 11/30/2012    Past Surgical History  Procedure Laterality Date  . Turbinate reduction    . Chest tube insertion  11/29/2012    Procedure: INSERTION PLEURAL DRAINAGE CATHETER;  Surgeon: [name], MD;  Location: [facility] OR;  Service: Thoracic;  Laterality: Right;  . Removal of pleural drainage catheter Right 02/14/2013    Procedure: REMOVAL OF PLEURAL DRAINAGE CATHETER;  Surgeon: [name], MD;  Location: [facility] OR;  Service: Thoracic;  Laterality: Right;    Family History  Problem Relation Age of Onset  . Cancer Mother     breast    History  Substance Use Topics  . Smoking status: Former Smoker -- 1.00 packs/day for 35 years    Types: Cigarettes  . Smokeless tobacco: Former Neurosurgeon    Quit date: 11/25/2012  . Alcohol Use: No      Review of Systems  All other systems reviewed and are negative.    Allergies  Review of patient's allergies indicates no known allergies.  Home Medications   Current Outpatient Rx  Name   Route  Sig  Dispense  Refill  . diphenhydrAMINE (BENADRYL) 25 mg capsule   Oral   Take 25 mg by mouth every 4 (four) hours as needed for itching.         . lamoTRIgine (LAMICTAL) 150 MG tablet   Oral   Take 150 mg by mouth at bedtime.         . mirtazapine (REMERON) 45 MG tablet   Oral   Take 45 mg by mouth at bedtime.         . Multiple Vitamin (MULTIVITAMIN WITH MINERALS) TABS   Oral   Take 1 tablet by mouth daily.         Marland Kitchen PRESCRIPTION MEDICATION   Intravenous   Inject into the vein every 21 ( twenty-one) days. Alimta infusion q3weeks.           BP 139/95  Pulse 102  Temp(Src) 98.1 F (36.7 C) (Oral)  Resp 20  SpO2 95%  Physical Exam  Nursing note and vitals reviewed. Constitutional: He appears well-developed and well-nourished. No distress.  HENT:  Head: Normocephalic and atraumatic.  Right Ear: External ear normal.  Left Ear: External ear normal.  Nose: No sinus tenderness, septal deviation or nasal septal hematoma. Epistaxis is observed.  Eyes: Conjunctivae are normal. Right eye exhibits no discharge. Left eye exhibits no discharge. No scleral icterus.  Neck: Neck supple. No tracheal deviation present.  Cardiovascular: Normal rate, regular rhythm and intact distal pulses.   Pulmonary/Chest: Effort normal and breath sounds normal. No stridor. No respiratory distress. He has no wheezes. He has no rales.  Abdominal: Soft. Bowel sounds are normal. He exhibits no distension. There is no tenderness. There is no rebound and no guarding.  Musculoskeletal: He exhibits no edema and no tenderness.  Neurological: He is alert. He has normal strength. No sensory deficit. Cranial nerve deficit:  no gross defecits noted. He exhibits normal muscle tone. He displays no seizure activity. Coordination normal.  Skin: Skin is warm and dry. No rash noted.  Psychiatric: He has a normal mood and affect.    ED Course  EPISTAXIS MANAGEMENT Date/Time: 05/17/2013 2:40  PM Performed by: Linwood Dibbles R Authorized by: Linwood Dibbles R Consent: Verbal consent obtained. Patient sedated: no Treatment site: right anterior and left anterior Repair method: merocel sponge Post-procedure assessment: bleeding stopped Treatment complexity: simple Patient tolerance: Patient tolerated the procedure well with no immediate complications.   (including critical care time)  Labs Reviewed  CBC WITH DIFFERENTIAL - Abnormal; Notable for the following:    WBC 3.7 (*)    RBC 3.09 (*)    Hemoglobin 10.5 (*)    HCT 29.6 (*)    Neutrophils Relative % 27 (*)    Lymphocytes Relative 68 (*)    Monocytes Relative 2 (*)    Neutro Abs 1.1 (*)    All other components within normal limits  BASIC METABOLIC PANEL  PROTIME-INR   No results found.   1. Epistaxis       MDM  Patient was monitored in the emergency department after the nasal packing. He tolerated this well. There was no recurrent bleeding.  His hemoglobin is slightly lower than his most recent laboratory tests but he does not require transfusion and further treatment.  His platelet count is normal. Patient will be referred to ENT for removal of his packing as an outpatient.        Celene Kras, MD 05/17/13 [phone]

## 2013-05-17 NOTE — Telephone Encounter (Signed)
Patient called reporting "emergency adverse reactions to Alimta"  Asking what to do for a re-occuring nose bleed that today "broke loose like a faucet".  Has lasted for three hours.  Instructed to go to ER.  Also reporting a rash with hives that broke out on Sunday from his shoulder to his elbows and his knees to his hips.  Tried benadryl cream with no relief.  Has received two cycles of alimta on 04-15-2013 & 05-06-2013.  Is a protocol patient and research staff notified of his call.  Will notify providers.

## 2013-05-27 ENCOUNTER — Telehealth: Payer: Self-pay | Admitting: Internal Medicine

## 2013-05-27 ENCOUNTER — Ambulatory Visit (HOSPITAL_BASED_OUTPATIENT_CLINIC_OR_DEPARTMENT_OTHER): Payer: Medicaid Other | Admitting: Physician Assistant

## 2013-05-27 ENCOUNTER — Encounter: Payer: Self-pay | Admitting: *Deleted

## 2013-05-27 ENCOUNTER — Ambulatory Visit (HOSPITAL_BASED_OUTPATIENT_CLINIC_OR_DEPARTMENT_OTHER): Payer: Medicaid Other

## 2013-05-27 ENCOUNTER — Other Ambulatory Visit (HOSPITAL_BASED_OUTPATIENT_CLINIC_OR_DEPARTMENT_OTHER): Payer: Medicaid Other | Admitting: Lab

## 2013-05-27 ENCOUNTER — Other Ambulatory Visit: Payer: Self-pay | Admitting: *Deleted

## 2013-05-27 ENCOUNTER — Encounter: Payer: Self-pay | Admitting: Physician Assistant

## 2013-05-27 ENCOUNTER — Ambulatory Visit: Payer: Medicaid Other

## 2013-05-27 ENCOUNTER — Telehealth: Payer: Self-pay | Admitting: *Deleted

## 2013-05-27 VITALS — BP 134/92 | HR 86 | Temp 96.9°F | Resp 18 | Ht 75.0 in | Wt 158.5 lb

## 2013-05-27 DIAGNOSIS — C3491 Malignant neoplasm of unspecified part of right bronchus or lung: Secondary | ICD-10-CM

## 2013-05-27 DIAGNOSIS — C3490 Malignant neoplasm of unspecified part of unspecified bronchus or lung: Secondary | ICD-10-CM

## 2013-05-27 DIAGNOSIS — C3430 Malignant neoplasm of lower lobe, unspecified bronchus or lung: Secondary | ICD-10-CM

## 2013-05-27 LAB — COMPREHENSIVE METABOLIC PANEL (CC13)
BUN: 5.6 mg/dL — ABNORMAL LOW (ref 7.0–26.0)
CO2: 28 mEq/L (ref 22–29)
Calcium: 9.5 mg/dL (ref 8.4–10.4)
Chloride: 99 mEq/L (ref 98–107)
Creatinine: 0.8 mg/dL (ref 0.7–1.3)
Total Bilirubin: 0.39 mg/dL (ref 0.20–1.20)

## 2013-05-27 LAB — CBC WITH DIFFERENTIAL/PLATELET
BASO%: 0.1 % (ref 0.0–2.0)
Basophils Absolute: 0 10*3/uL (ref 0.0–0.1)
EOS%: 1 % (ref 0.0–7.0)
HCT: 33.9 % — ABNORMAL LOW (ref 38.4–49.9)
HGB: 11.5 g/dL — ABNORMAL LOW (ref 13.0–17.1)
LYMPH%: 19.8 % (ref 14.0–49.0)
MCH: 33.9 pg — ABNORMAL HIGH (ref 27.2–33.4)
MCHC: 33.9 g/dL (ref 32.0–36.0)
NEUT%: 65 % (ref 39.0–75.0)
Platelets: 524 10*3/uL — ABNORMAL HIGH (ref 140–400)
lymph#: 1.4 10*3/uL (ref 0.9–3.3)

## 2013-05-27 LAB — LACTATE DEHYDROGENASE (CC13): LDH: 198 U/L (ref 125–245)

## 2013-05-27 MED ORDER — CYANOCOBALAMIN 1000 MCG/ML IJ SOLN
1000.0000 ug | Freq: Once | INTRAMUSCULAR | Status: AC
  Administered 2013-05-27: 1000 ug via INTRAMUSCULAR

## 2013-05-27 NOTE — Patient Instructions (Addendum)
Cycle #3 of her maintenance chemotherapy according to ECoG protocol 508 is being postponed to allow you additional time to recover from your episode of epistaxis. He will receive your vitamin B12 injection today as scheduled Followup in one week to proceed with cycle #3 of your maintenance chemotherapy without the use of oral dexamethasone as discussed

## 2013-05-27 NOTE — Progress Notes (Signed)
[facility] [phone]   Fax:([phone]  OFFICE PROGRESS NOTE   DIAGNOSIS: Metastatic non-small cell lung cancer, adenocarcinoma with negative EGFR mutation and negative ALK gene translocation diagnosed in December of 2013.   PRIOR THERAPY:  1. Status post Pleurx catheter placement for drainage of right-sided pleural effusion under the care of Dr. Laneta Simmers on 11/29/2012.  2. Systemic chemotherapy with carboplatin for AUC of 6, paclitaxel 200 mg/M2 and Avastin 15 mg/kg every 3 weeks according to the ECoG protocol 5508. Status post 4 cycles.  CURRENT THERAPY: Maintenance chemotherapy according to the ECoG 5508 protocol, patient has been randomized to pemetrexed 500 mg per meter squared given every 3 weeks. Status post 2 cycles  INTERVAL HISTORY: Jay Watts 52 y.o. male returns to the clinic today for a followup visit to proceed with his  maintenance chemotherapy according to the ECoG 5508 protocol. He has been randomized to receive pemetrexed only, status post 2 cycles. He continues to have significant issues with the oral steroids associated with his maintenance chemotherapy with pemetrexed. He has problems with mood swings, increased irritability, range and 34 days after taking the oral steroids and experiences a "crash". He is finding these symptoms increasingly disruptive of his life and totally unacceptable. In the interim since his last cycle he also had episode of epistaxis that required a visit to the emergency room when he is unable to get under control after 2 hours. He required nasal packing with the packing been removed by an ear nose and throat specialist in the event that the his nose had to be repacked. He found that whole experience uncomfortable. He was able to travel by train to [place] to his daughters high school graduation. He enjoyed himself and did not have any further episodes of epistaxis. He is considering discontinuing treatment with  pemetrexed and most definitely does not desire to continue any further oral dexamethasone. At the very least he would like to postpone treatment by one week so that he can recover completely from his episode of significant epistaxis requiring nasal packing. He voiced no other specific complaints.  He denied any other bleeding or bruising.  He denied having any significant fever or chills, no nausea or vomiting, no weight loss or night sweats. The patient denied having any significant peripheral neuropathy. He denied having any significant chest pain, shortness breath, cough or hemoptysis.   MEDICAL HISTORY: Past Medical History  Diagnosis Date  . Bipolar 2 disorder   . Shortness of breath     with exertion  . Pneumothorax     right side- spring 2013  . Lung cancer   . Adenocarcinoma, lung 11/30/2012    ALLERGIES:  has No Known Allergies.  MEDICATIONS:  Current Outpatient Prescriptions  Medication Sig Dispense Refill  . diphenhydrAMINE (BENADRYL) 25 mg capsule Take 25 mg by mouth every 4 (four) hours as needed for itching.      . lamoTRIgine (LAMICTAL) 150 MG tablet Take 150 mg by mouth at bedtime.      . mirtazapine (REMERON) 45 MG tablet Take 45 mg by mouth at bedtime.      . Multiple Vitamin (MULTIVITAMIN WITH MINERALS) TABS Take 1 tablet by mouth daily.      Marland Kitchen PRESCRIPTION MEDICATION Inject into the vein every 21 ( twenty-one) days. Alimta infusion q3weeks.       No current facility-administered medications for this visit.    SURGICAL HISTORY:  Past Surgical History  Procedure Laterality Date  .  Turbinate reduction    . Chest tube insertion  11/29/2012    Procedure: INSERTION PLEURAL DRAINAGE CATHETER;  Surgeon: [name], MD;  Location: [facility] OR;  Service: Thoracic;  Laterality: Right;  . Removal of pleural drainage catheter Right 02/14/2013    Procedure: REMOVAL OF PLEURAL DRAINAGE CATHETER;  Surgeon: [name], MD;  Location: [facility] OR;  Service: Thoracic;  Laterality:  Right;    REVIEW OF SYSTEMS:  Pertinent items are noted in HPI.   PHYSICAL EXAMINATION: General appearance: alert, cooperative and no distress Head: Normocephalic, without obvious abnormality, atraumatic Neck: no adenopathy Lymph nodes: Cervical, supraclavicular, and axillary nodes normal. Resp: clear to auscultation bilaterally Cardio: regular rate and rhythm, S1, S2 normal, no murmur, click, rub or gallop GI: soft, non-tender; bowel sounds normal; no masses,  no organomegaly Extremities: extremities normal, atraumatic, no cyanosis or edema Neurologic: Alert and oriented X 3, normal strength and tone. Normal symmetric reflexes. Normal coordination and gait  ECOG PERFORMANCE STATUS: 0 - Asymptomatic  Blood pressure 134/92, pulse 86, temperature 96.9 F (36.1 C), temperature source Oral, resp. rate 18, height 6\' 3"  (1.905 m), weight 158 lb 8 oz (71.895 kg).  LABORATORY DATA: Lab Results  Component Value Date   WBC 7.2 05/27/2013   HGB 11.5* 05/27/2013   HCT 33.9* 05/27/2013   MCV 100.0* 05/27/2013   PLT 524* 05/27/2013      Chemistry      Component Value Date/Time   NA 137 05/27/2013 0827   NA 135 05/17/2013 1320   K 4.4 05/27/2013 0827   K 3.9 05/17/2013 1320   CL 99 05/27/2013 0827   CL 97 05/17/2013 1320   CO2 28 05/27/2013 0827   CO2 27 05/17/2013 1320   BUN 5.6* 05/27/2013 0827   BUN 11 05/17/2013 1320   CREATININE 0.8 05/27/2013 0827   CREATININE 0.53 05/17/2013 1320      Component Value Date/Time   CALCIUM 9.5 05/27/2013 0827   CALCIUM 8.9 05/17/2013 1320   ALKPHOS 103 05/27/2013 0827   ALKPHOS 82 11/26/2012 0500   AST 22 05/27/2013 0827   AST 13 11/26/2012 0500   ALT 15 05/27/2013 0827   ALT 9 11/26/2012 0500   BILITOT 0.39 05/27/2013 0827   BILITOT 0.3 11/26/2012 0500       RADIOGRAPHIC STUDIES: Dg Chest 2 View  01/07/2013  *RADIOLOGY REPORT*  Clinical Data: Pleural effusion and shortness of breath.  CHEST - 2 VIEW  Comparison: 11/30/2012.  Findings: Trachea is midline.  Trachea is  deviated slightly to the right.  Heart size normal.  Right apical pleural parenchymal scarring.  Small right hydropneumothorax with right chest tube in place at the base of the right hemithorax. Amount of pleural fluid has decreased in the interval from 11/30/2012.  Lungs are emphysematous.  IMPRESSION:  1.  Small right hydropneumothorax with right chest tube in place. Amount of pleural fluid has decreased from 11/30/2012. 2.  Right lower lobe mass is better seen on 12/11/2012.   Original Report Authenticated By: Leanna Battles, M.D.    Ct Chest W Contrast  02/01/2013  *RADIOLOGY REPORT*  Clinical Data:  Restaging lung cancer recist protocol  CT CHEST, ABDOMEN AND PELVIS WITH CONTRAST  Technique:  Multidetector CT imaging of the chest, abdomen and pelvis was performed following the standard protocol during bolus administration of intravenous contrast.  Contrast: OMNIPAQUE IOHEXOL 300 MG/ML  SOLN  Comparison:  PET scan 12/11/2012, CT abdomen 12/17/2012, CT thorax 11/25/2012  RECIST protocol 1.1  Target Lesions:  1.Right lower lobe density measures 1.8 cm compared to 2.1 cm on prior (image 51). This lesion is now enhancing and is adjacent to the rounded atelectasis.  2. Pretracheal lymph node measures 1.5 cm compared to 1.9 cm on prior (image 28).  Non Target lesions:  1. Subcarinal lymph node measures 11 mm compared to 17 mm on prior (image 34) 2.  Right hilar lymph node measures 10 mm compared to 18 mm on prior (image 32). 3.  Anterior (prevascular) lymph node measures 10 mm short axis compared to 15 mm on prior (image 22). 4.  Pleural effusion present.  CT CHEST  Findings:  No axillary or supraclavicular lymphadenopathy. Mediastinal lymphadenopathy is decreased compared to prior. 15 mm right lower paratracheal node is decreased from 22 mm comparison PET CT scan.  Subcarinal lymph node measures 11 mm (image 34) decreased from 16 mm on prior.  No pericardial fluid.  Review of the lung parenchyma demonstrates  a small persistent right pleural effusion with a small bore chest tube in place.  There is a focus of round atelectasis at the right lung base measuring 4.3 x 2.4 cm which is decreased in volume from 5.0 x 3.2 cm on comparison PET CT scan.  Atelectasis is adjacent to a pleural based metastasis measuring 17 mm (image 51) which is difficult to see on the noncontrast PET CT scan.  The pleural metastasis seen on comparison PET CT scan appear reduced although difficult to appreciate on the noncontrast CT.  Left lung is clear.  Paraseptal emphysema is present.  IMPRESSION:  1.  Interval reduction in volume of mediastinal lymphadenopathy. 2.  Interval reduction in volume of round atelectasis at the right lung base with pleural metastasis associated. 3.  Pleural based metastasis seen on comparison PET CT scan are likely reduced.  CT ABDOMEN AND PELVIS  Findings:  No focal hepatic lesion.  Gallbladder, pancreas, spleen, adrenal glands, and kidneys are normal.  Stomach, small bowel, and colon are normal.  Abdominal aorta normal caliber.  No retroperitoneal periportal lymphadenopathy.  Enlarged gastrohepatic lymph node seen on comparison PET CT scan difficult to appreciate but appears smaller measuring less than 10 mm (image 67).  No free fluid the pelvis.  The prostate gland bladder normal.  No pelvic lymphadenopathy.  There is a peritoneal disease or mesenteric disease.  Review of  bone windows demonstrates no aggressive osseous lesions.  IMPRESSION:  1.  Decrease in the volume of lymphadenopathy in the upper abdomen/gastrohepatic ligament.  2.  No evidence of disease progression in the abdomen or pelvis.   Original Report Authenticated By: Genevive Bi, M.D.     ASSESSMENT/PLAN: This is a very pleasant 52 years old white male with metastatic non-small cell lung cancer, adenocarcinoma currently on systemic chemotherapy with carboplatin, paclitaxel and Avastin status post 4 cycles. Patient was discussed with Dr.  Arbutus Ped. The patient has been randomized to the maintenance arm of the cognitive 04/23/2007 receiving pemetrexed only, status post 2 cycles.  He continues to have significant personality changes related to the dexamethasone. We will discontinue the oral dexamethasone and will consider decreasing the dose of IV dexamethasone. Dr. Arbutus Ped also had a discussion with the patient. The epistaxis and the type of rash that the patient as described our unlikely related to the pemetrexed. The patient is willing to proceed with cycle 3 of pemetrexed without the oral steroids and this will be rescheduled to next week. He'll followup in one week with a repeat CBC differential  and C. Met, prior to cycle #3 of his maintenance chemotherapy with single agent pemetrexed at 500 mg per meter squared given every 3 weeks. He will be due to be scheduled for restaging CT scans to reevaluate his disease at this followup visit.   Jay Watts E, PA-C  He was advised to call  immediately if he has any concerning symptoms in the interval.  All questions were answered. The patient knows to call the clinic with any problems, questions or concerns. We can certainly see the patient much sooner if necessary.  I spent 20 minutes counseling the patient face to face. The total time spent in the appointment was 30 minutes.  Hematology/oncology attending: The patient is seen today and treatment plan was discussed with him and with Ms. Laural Benes in details. He has some concern about continuing treatment with Alimta because of the steroid premedications which makes him more agitated. He also has some nosebleed and would like to delay his chemotherapy by 1 week. He has 1-2 days of skin rash that resolved spontaneously. I recommended for the patient to proceed with his third cycle of treatment with Alimta next week but we will discontinue the steroid premedication.  he would have repeat CT scan of the chest for restaging of his disease. He  would come back for followup visit with me in 4 weeks for evaluation and discussion of his further treatment options. He was advised to call immediately if he has any concerning symptoms in the interval.

## 2013-05-27 NOTE — Telephone Encounter (Signed)
Per staff message and POF I have scheduled appts.  JMW  

## 2013-05-27 NOTE — Progress Notes (Signed)
05/27/13 [id number], cycle 3 maintenance, delayed  Jay Watts is in the cancer center today for lab work, physical exam, and cycle 3 of alimta. He reports he has had a difficult time since his last treatment. He experienced nose bleeds requiring packing. He experienced insomnia and agitation while and after taking decadron. His ability to work was affected by his feeling of irritability. He experienced a period of hives in his arms, legs and chest 11 days after treatment, relieved with a topical cortisone cream.  He comes to the cancer center today having decided to stop treatment with alimta. He is willing to take other maintenance treatment and to remain in follow up on the study.   After discussion with Dr. Arbutus Ped, he has agreed to remain on treatment with alimta. He will stop taking the decadron before treatment.  He says he is beginning to feel better and does not want to receive treatment today. He will be rescheduled for treatment in 1 week.

## 2013-05-27 NOTE — Telephone Encounter (Signed)
gv and pritned appt sched and avs for pt....gv pt barium   °

## 2013-05-27 NOTE — Telephone Encounter (Signed)
gv pt appt schedule/AVS for June/July. Per 6/9 pof (4sent). Disregard order for lb/fu/tx 6/30. Next appt will be 6/16. Pt aware and also aware that central will call w/ct appt. No orders given at this time re 7/21 tx already on schedule.

## 2013-05-31 ENCOUNTER — Other Ambulatory Visit: Payer: Self-pay | Admitting: *Deleted

## 2013-05-31 DIAGNOSIS — C3490 Malignant neoplasm of unspecified part of unspecified bronchus or lung: Secondary | ICD-10-CM

## 2013-06-03 ENCOUNTER — Ambulatory Visit (HOSPITAL_BASED_OUTPATIENT_CLINIC_OR_DEPARTMENT_OTHER): Payer: Medicaid Other

## 2013-06-03 ENCOUNTER — Encounter: Payer: Self-pay | Admitting: *Deleted

## 2013-06-03 ENCOUNTER — Encounter: Payer: Self-pay | Admitting: Physician Assistant

## 2013-06-03 ENCOUNTER — Ambulatory Visit (HOSPITAL_BASED_OUTPATIENT_CLINIC_OR_DEPARTMENT_OTHER): Payer: Medicaid Other | Admitting: Physician Assistant

## 2013-06-03 ENCOUNTER — Telehealth: Payer: Self-pay | Admitting: Internal Medicine

## 2013-06-03 ENCOUNTER — Telehealth: Payer: Self-pay | Admitting: *Deleted

## 2013-06-03 ENCOUNTER — Other Ambulatory Visit (HOSPITAL_BASED_OUTPATIENT_CLINIC_OR_DEPARTMENT_OTHER): Payer: Medicaid Other | Admitting: Lab

## 2013-06-03 VITALS — BP 122/80 | HR 109 | Temp 96.9°F | Resp 18 | Ht 75.0 in | Wt 156.4 lb

## 2013-06-03 DIAGNOSIS — C3430 Malignant neoplasm of lower lobe, unspecified bronchus or lung: Secondary | ICD-10-CM

## 2013-06-03 DIAGNOSIS — Z5111 Encounter for antineoplastic chemotherapy: Secondary | ICD-10-CM

## 2013-06-03 DIAGNOSIS — C3490 Malignant neoplasm of unspecified part of unspecified bronchus or lung: Secondary | ICD-10-CM

## 2013-06-03 DIAGNOSIS — C3491 Malignant neoplasm of unspecified part of right bronchus or lung: Secondary | ICD-10-CM

## 2013-06-03 LAB — CBC WITH DIFFERENTIAL/PLATELET
BASO%: 0.7 % (ref 0.0–2.0)
EOS%: 0.1 % (ref 0.0–7.0)
HGB: 10.1 g/dL — ABNORMAL LOW (ref 13.0–17.1)
LYMPH%: 12.2 % — ABNORMAL LOW (ref 14.0–49.0)
MCH: 36.1 pg — ABNORMAL HIGH (ref 27.2–33.4)
MCHC: 35.5 g/dL (ref 32.0–36.0)
MONO%: 9.3 % (ref 0.0–14.0)
NEUT#: 6.3 10*3/uL (ref 1.5–6.5)
NEUT%: 77.7 % — ABNORMAL HIGH (ref 39.0–75.0)
WBC: 8.1 10*3/uL (ref 4.0–10.3)

## 2013-06-03 LAB — COMPREHENSIVE METABOLIC PANEL (CC13)
Alkaline Phosphatase: 97 U/L (ref 40–150)
BUN: 7.6 mg/dL (ref 7.0–26.0)
Glucose: 125 mg/dl — ABNORMAL HIGH (ref 70–99)
Total Bilirubin: 0.21 mg/dL (ref 0.20–1.20)

## 2013-06-03 LAB — MAGNESIUM (CC13): Magnesium: 2.1 mg/dl (ref 1.5–2.5)

## 2013-06-03 MED ORDER — SODIUM CHLORIDE 0.9 % IV SOLN
Freq: Once | INTRAVENOUS | Status: AC
  Administered 2013-06-03: 11:00:00 via INTRAVENOUS

## 2013-06-03 MED ORDER — ONDANSETRON 8 MG/50ML IVPB (CHCC)
8.0000 mg | Freq: Once | INTRAVENOUS | Status: AC
  Administered 2013-06-03: 8 mg via INTRAVENOUS

## 2013-06-03 MED ORDER — SODIUM CHLORIDE 0.9 % IV SOLN
500.0000 mg/m2 | Freq: Once | INTRAVENOUS | Status: AC
  Administered 2013-06-03: 975 mg via INTRAVENOUS
  Filled 2013-06-03: qty 39

## 2013-06-03 NOTE — Patient Instructions (Signed)
 Cancer Center Discharge Instructions for Patients Receiving Chemotherapy  Today you received the following chemotherapy agents: Alimta  To help prevent nausea and vomiting after your treatment, we encourage you to take your nausea medication as directed by your MD.   If you develop nausea and vomiting that is not controlled by your nausea medication, call the clinic.   BELOW ARE SYMPTOMS THAT SHOULD BE REPORTED IMMEDIATELY:  *FEVER GREATER THAN 100.5 F  *CHILLS WITH OR WITHOUT FEVER  NAUSEA AND VOMITING THAT IS NOT CONTROLLED WITH YOUR NAUSEA MEDICATION  *UNUSUAL SHORTNESS OF BREATH  *UNUSUAL BRUISING OR BLEEDING  TENDERNESS IN MOUTH AND THROAT WITH OR WITHOUT PRESENCE OF ULCERS  *URINARY PROBLEMS  *BOWEL PROBLEMS  UNUSUAL RASH Items with * indicate a potential emergency and should be followed up as soon as possible.  Feel free to call the clinic you have any questions or concerns. The clinic phone number is [phone].

## 2013-06-03 NOTE — Progress Notes (Addendum)
[facility] [phone]   [phone]  SHARED VISIT PROGRESS NOTE   DIAGNOSIS: Metastatic non-small cell lung cancer, adenocarcinoma with negative EGFR mutation and negative ALK gene translocation diagnosed in December of 2013.   PRIOR THERAPY:  1. Status post Pleurx catheter placement for drainage of right-sided pleural effusion under the care of Dr. Laneta Simmers on 11/29/2012.  2. Systemic chemotherapy with carboplatin for AUC of 6, paclitaxel 200 mg/M2 and Avastin 15 mg/kg every 3 weeks according to the ECoG protocol 5508. Status post 4 cycles.  CURRENT THERAPY: Maintenance chemotherapy according to the ECoG 5508 protocol, patient has been randomized to pemetrexed 500 mg per meter squared given every 3 weeks. Status post 2 cycles  INTERVAL HISTORY: Jay Watts 52 y.o. male returns to the clinic today for a followup visit to proceed with his  maintenance chemotherapy according to the ECoG 5508 protocol. He has been randomized to receive pemetrexed only, status post 2 cycles. He continues to have significant issues with the oral steroids associated with his maintenance chemotherapy with pemetrexed. He has problems with mood swings, increased irritability, range and 3 to 4 days after taking the oral steroids and experiences a "crash". He is finding these symptoms increasingly disruptive of his life and totally unacceptable. He again has had issues with epistaxis, primarily involving the left nares, requiring another visit to the ENT specialist and laser cauterization of the left nares. Unfortunately less than 24 hours after this procedure involving the left nares., he again experienced epistaxes. He has been applying Neosporin as instructed by the ENT specialist, Dr. Melvenia Beam. This morning however he did not apply the Neosporin as he had a "scab" in the left nares and was fearful of dislodging this and having another bout of uncontrolled epistaxis. He is  considering discontinuing treatment with pemetrexed and most definitely does not desire to continue any further oral dexamethasone. He is willing to proceed with the pemetrexed however deathly does not want any further dexamethasone due to the negative psychiatric effects it has on him. He denied any other bleeding or bruising.  He denied having any significant fever or chills, no nausea or vomiting, no weight loss or night sweats. The patient denied having any significant peripheral neuropathy. He denied having any significant chest pain, shortness breath, cough or hemoptysis.   MEDICAL HISTORY: Past Medical History  Diagnosis Date  . Bipolar 2 disorder   . Shortness of breath     with exertion  . Pneumothorax     right side- spring 2013  . Lung cancer   . Adenocarcinoma, lung 11/30/2012    ALLERGIES:  has No Known Allergies.  MEDICATIONS:  Current Outpatient Prescriptions  Medication Sig Dispense Refill  . diphenhydrAMINE (BENADRYL) 25 mg capsule Take 25 mg by mouth every 4 (four) hours as needed for itching.      . lamoTRIgine (LAMICTAL) 150 MG tablet Take 150 mg by mouth at bedtime.      . mirtazapine (REMERON) 45 MG tablet Take 45 mg by mouth at bedtime.      . Multiple Vitamin (MULTIVITAMIN WITH MINERALS) TABS Take 1 tablet by mouth daily.      Marland Kitchen PRESCRIPTION MEDICATION Inject into the vein every 21 ( twenty-one) days. Alimta infusion q3weeks.       No current facility-administered medications for this visit.    SURGICAL HISTORY:  Past Surgical History  Procedure Laterality Date  . Turbinate reduction    . Chest tube insertion  11/29/2012    Procedure: INSERTION PLEURAL DRAINAGE CATHETER;  Surgeon: [name], MD;  Location: [facility] OR;  Service: Thoracic;  Laterality: Right;  . Removal of pleural drainage catheter Right 02/14/2013    Procedure: REMOVAL OF PLEURAL DRAINAGE CATHETER;  Surgeon: [name], MD;  Location: [facility] OR;  Service: Thoracic;  Laterality: Right;     REVIEW OF SYSTEMS:  Pertinent items are noted in HPI.   PHYSICAL EXAMINATION: General appearance: alert, cooperative and no distress Head: Normocephalic, without obvious abnormality, atraumatic Neck: no adenopathy Lymph nodes: Cervical, supraclavicular, and axillary nodes normal. Resp: clear to auscultation bilaterally Cardio: regular rate and rhythm, S1, S2 normal, no murmur, click, rub or gallop GI: soft, non-tender; bowel sounds normal; no masses,  no organomegaly Extremities: extremities normal, atraumatic, no cyanosis or edema Neurologic: Alert and oriented X 3, normal strength and tone. Normal symmetric reflexes. Normal coordination and gait  ECOG PERFORMANCE STATUS: 0 - Asymptomatic  Blood pressure 122/80, pulse 109, temperature 96.9 F (36.1 C), temperature source Oral, resp. rate 18, height 6\' 3"  (1.905 m), weight 156 lb 6.4 oz (70.943 kg), SpO2 98.00%.  LABORATORY DATA: Lab Results  Component Value Date   WBC 8.1 06/03/2013   HGB 10.1* 06/03/2013   HCT 28.5* 06/03/2013   MCV 101.6* 06/03/2013   PLT 391 06/03/2013      Chemistry      Component Value Date/Time   NA 136 06/03/2013 0914   NA 135 05/17/2013 1320   K 4.2 06/03/2013 0914   K 3.9 05/17/2013 1320   CL 98 06/03/2013 0914   CL 97 05/17/2013 1320   CO2 26 06/03/2013 0914   CO2 27 05/17/2013 1320   BUN 7.6 06/03/2013 0914   BUN 11 05/17/2013 1320   CREATININE 0.7 06/03/2013 0914   CREATININE 0.53 05/17/2013 1320      Component Value Date/Time   CALCIUM 9.4 06/03/2013 0914   CALCIUM 8.9 05/17/2013 1320   ALKPHOS 97 06/03/2013 0914   ALKPHOS 82 11/26/2012 0500   AST 28 06/03/2013 0914   AST 13 11/26/2012 0500   ALT 13 06/03/2013 0914   ALT 9 11/26/2012 0500   BILITOT 0.21 06/03/2013 0914   BILITOT 0.3 11/26/2012 0500       RADIOGRAPHIC STUDIES: Dg Chest 2 View  01/07/2013  *RADIOLOGY REPORT*  Clinical Data: Pleural effusion and shortness of breath.  CHEST - 2 VIEW  Comparison: 11/30/2012.  Findings: Trachea is  midline.  Trachea is deviated slightly to the right.  Heart size normal.  Right apical pleural parenchymal scarring.  Small right hydropneumothorax with right chest tube in place at the base of the right hemithorax. Amount of pleural fluid has decreased in the interval from 11/30/2012.  Lungs are emphysematous.  IMPRESSION:  1.  Small right hydropneumothorax with right chest tube in place. Amount of pleural fluid has decreased from 11/30/2012. 2.  Right lower lobe mass is better seen on 12/11/2012.   Original Report Authenticated By: Leanna Battles, M.D.    Ct Chest W Contrast  02/01/2013  *RADIOLOGY REPORT*  Clinical Data:  Restaging lung cancer recist protocol  CT CHEST, ABDOMEN AND PELVIS WITH CONTRAST  Technique:  Multidetector CT imaging of the chest, abdomen and pelvis was performed following the standard protocol during bolus administration of intravenous contrast.  Contrast: OMNIPAQUE IOHEXOL 300 MG/ML  SOLN  Comparison:  PET scan 12/11/2012, CT abdomen 12/17/2012, CT thorax 11/25/2012  RECIST protocol 1.1  Target Lesions:  1.Right lower lobe density  measures 1.8 cm compared to 2.1 cm on prior (image 51). This lesion is now enhancing and is adjacent to the rounded atelectasis.  2. Pretracheal lymph node measures 1.5 cm compared to 1.9 cm on prior (image 28).  Non Target lesions:  1. Subcarinal lymph node measures 11 mm compared to 17 mm on prior (image 34) 2.  Right hilar lymph node measures 10 mm compared to 18 mm on prior (image 32). 3.  Anterior (prevascular) lymph node measures 10 mm short axis compared to 15 mm on prior (image 22). 4.  Pleural effusion present.  CT CHEST  Findings:  No axillary or supraclavicular lymphadenopathy. Mediastinal lymphadenopathy is decreased compared to prior. 15 mm right lower paratracheal node is decreased from 22 mm comparison PET CT scan.  Subcarinal lymph node measures 11 mm (image 34) decreased from 16 mm on prior.  No pericardial fluid.  Review of the lung  parenchyma demonstrates a small persistent right pleural effusion with a small bore chest tube in place.  There is a focus of round atelectasis at the right lung base measuring 4.3 x 2.4 cm which is decreased in volume from 5.0 x 3.2 cm on comparison PET CT scan.  Atelectasis is adjacent to a pleural based metastasis measuring 17 mm (image 51) which is difficult to see on the noncontrast PET CT scan.  The pleural metastasis seen on comparison PET CT scan appear reduced although difficult to appreciate on the noncontrast CT.  Left lung is clear.  Paraseptal emphysema is present.  IMPRESSION:  1.  Interval reduction in volume of mediastinal lymphadenopathy. 2.  Interval reduction in volume of round atelectasis at the right lung base with pleural metastasis associated. 3.  Pleural based metastasis seen on comparison PET CT scan are likely reduced.  CT ABDOMEN AND PELVIS  Findings:  No focal hepatic lesion.  Gallbladder, pancreas, spleen, adrenal glands, and kidneys are normal.  Stomach, small bowel, and colon are normal.  Abdominal aorta normal caliber.  No retroperitoneal periportal lymphadenopathy.  Enlarged gastrohepatic lymph node seen on comparison PET CT scan difficult to appreciate but appears smaller measuring less than 10 mm (image 67).  No free fluid the pelvis.  The prostate gland bladder normal.  No pelvic lymphadenopathy.  There is a peritoneal disease or mesenteric disease.  Review of  bone windows demonstrates no aggressive osseous lesions.  IMPRESSION:  1.  Decrease in the volume of lymphadenopathy in the upper abdomen/gastrohepatic ligament.  2.  No evidence of disease progression in the abdomen or pelvis.   Original Report Authenticated By: Genevive Bi, M.D.     ASSESSMENT/PLAN: This is a very pleasant 52 years old white male with metastatic non-small cell lung cancer, adenocarcinoma currently on systemic chemotherapy with carboplatin, paclitaxel and Avastin status post 4 cycles. Patient was  discussed with Dr. Arbutus Ped. The patient has been randomized to the maintenance arm of the ECOG 5508 receiving pemetrexed only, status post 2 cycles.  He continues to have significant personality changes related to the dexamethasone. We will discontinue the oral dexamethasone and after further discussion with Dr. Arbutus Ped we will discontinue the IV dexamethasone as well. He will be given Zofran as an antiemetic with his pemetrexed. He will proceed with cycle #3 of his pemetrexed today as scheduled. We will reschedule his restaging CT scan per protocol he will followup with Dr. Arbutus Ped in 3 weeks with repeat CBC differential and C. met and restaging CT scans as per protocol. It is not felt that the  epistaxis is related to the pemetrexed.  Jay Racicot E, PA-C  He was advised to call  immediately if he has any concerning symptoms in the interval.  All questions were answered. The patient knows to call the clinic with any problems, questions or concerns. We can certainly see the patient much sooner if necessary.  I spent 20 minutes counseling the patient face to face. The total time spent in the appointment was 30 minutes.  ADDENDUM: Hematology/oncology attending: The patient is seen today and I have a face to face encounter with him. I discussed his care plan and management with Ms. Laural Benes.   He continues to have mild epistaxis which coming mainly from the left nostril. He was seen by ENT and laser cauterization of the left nares was done. He continues to have some oozing of blood every now and then. He has mild fatigue. He would consider proceeding with his systemic chemotherapy without steroids. He would come back for followup visit in 3 weeks with the next cycle of his chemotherapy. He was advised to call immediately if he has any concerning symptoms in the interval.  MOHAMED,MOHAMED K. 06/03/2013

## 2013-06-03 NOTE — Progress Notes (Signed)
06/03/13 @ 09:15, BMS [id number] Visit 2 Labs and Questionnaires:  Jay Watts into the [facility] for his next cycle of maintenance therapy with pemetrexed per the E5508 study.  He had BMS research labs drawn, then completed the BMS questionnaires in the lobby prior to seeing the provider.  He had no questions or concerns involving these activities.  CTSU [id number], Maintenance Therapy, Pemetrexed Cycle 3, Day 1:  Jay Watts had a CBC, CMET, magnesium and LDH collected prior to seeing Tiana Loft, PA prior to his infusion.  All lab results were appropriate to proceed with treatment.  Ms. Laural Benes and Dr. Arbutus Ped discussed discontinuation of all pre-treatment steroids with Jay Watts and opted to discontinue all decadron, even the IV decadron as part of the infusional pre-medications.  He was pleased with that.  Spoke with Faith Rogue, RN in the infusion area about treatment today and provided her with a sign for the pump.  Alerted her and Anola Gurney, PharmD of the omission of decadron from the treatment plan.  The schedule will be amended to move his CT scans to 06/20/13 and his next treatment to 06/24/13.  Alerted Ms. Johnson to the date of his last Vitamin B-12 injection.

## 2013-06-03 NOTE — Patient Instructions (Addendum)
We are discontinue the dexamethasone as discussed He'll proceed with chemotherapy per protocol Followup with Dr. Arbutus Ped in 3 weeks with restaging CT scans also per protocol

## 2013-06-03 NOTE — Telephone Encounter (Signed)
Per staff message and POF I have scheduled appts.  JMW  

## 2013-06-03 NOTE — Telephone Encounter (Signed)
pt needed sched and did not want to wait to be updated.....stated that he just wanted the autamated system to call him....  Emailed MW to adjust chemo

## 2013-06-13 ENCOUNTER — Ambulatory Visit (HOSPITAL_COMMUNITY): Admission: RE | Admit: 2013-06-13 | Payer: Medicaid Other | Source: Ambulatory Visit

## 2013-06-17 ENCOUNTER — Ambulatory Visit: Payer: Medicaid Other

## 2013-06-17 ENCOUNTER — Ambulatory Visit: Payer: Medicaid Other | Admitting: Internal Medicine

## 2013-06-17 ENCOUNTER — Other Ambulatory Visit: Payer: Medicaid Other | Admitting: Lab

## 2013-06-19 ENCOUNTER — Telehealth: Payer: Self-pay | Admitting: *Deleted

## 2013-06-19 NOTE — Telephone Encounter (Signed)
Received all from radiology that pt's CT Scan has been denied for tomorrow morning.  Called to inform pt, unable to leave message.  Spoke with pt's father and mother.  They stated they will try to contact him to let him know.  Called and left a msg with Lilyan Punt regarding this and hopefully scan can be approved and possibly r/s to the afternoon 7/3 so he can keep his f/u and chemo as scheduled on 7/7.  Will forward to Dr Donnald Garre.  SLJ

## 2013-06-20 ENCOUNTER — Inpatient Hospital Stay (HOSPITAL_COMMUNITY): Admission: RE | Admit: 2013-06-20 | Payer: Medicaid Other | Source: Ambulatory Visit

## 2013-06-20 ENCOUNTER — Ambulatory Visit (HOSPITAL_COMMUNITY): Payer: Medicaid Other

## 2013-06-20 ENCOUNTER — Telehealth: Payer: Self-pay | Admitting: *Deleted

## 2013-06-20 NOTE — Telephone Encounter (Signed)
Spoke with Jay Watts this morning about his CT scan being canceled this morning. He is aware that the request for pre-certification is still being investigated. I also informed him about the plan to keep his Monday morning appointments for lab work and MD visit. Will try to get the CT approved and done within the time required by the study for him to receive treatment without repeating either labs or physical exam. He verbalized understanding.

## 2013-06-22 ENCOUNTER — Ambulatory Visit (HOSPITAL_COMMUNITY)
Admission: RE | Admit: 2013-06-22 | Discharge: 2013-06-22 | Disposition: A | Discharge status: Home / Self Care | Payer: Medicaid Other | Source: Ambulatory Visit | Attending: Internal Medicine | Admitting: Internal Medicine

## 2013-06-22 DIAGNOSIS — J439 Emphysema, unspecified: Secondary | ICD-10-CM | POA: Insufficient documentation

## 2013-06-22 DIAGNOSIS — R599 Enlarged lymph nodes, unspecified: Secondary | ICD-10-CM | POA: Insufficient documentation

## 2013-06-22 DIAGNOSIS — C3490 Malignant neoplasm of unspecified part of unspecified bronchus or lung: Secondary | ICD-10-CM

## 2013-06-22 MED ORDER — IOHEXOL 300 MG/ML  SOLN
80.0000 mL | Freq: Once | INTRAMUSCULAR | Status: AC | PRN
  Administered 2013-06-22: 80 mL via INTRAVENOUS

## 2013-06-24 ENCOUNTER — Encounter: Payer: Self-pay | Admitting: Internal Medicine

## 2013-06-24 ENCOUNTER — Ambulatory Visit (HOSPITAL_BASED_OUTPATIENT_CLINIC_OR_DEPARTMENT_OTHER): Payer: Medicaid Other | Admitting: Internal Medicine

## 2013-06-24 ENCOUNTER — Telehealth: Payer: Self-pay | Admitting: *Deleted

## 2013-06-24 ENCOUNTER — Encounter: Payer: Self-pay | Admitting: *Deleted

## 2013-06-24 ENCOUNTER — Telehealth: Payer: Self-pay | Admitting: Internal Medicine

## 2013-06-24 ENCOUNTER — Other Ambulatory Visit: Payer: Self-pay | Admitting: *Deleted

## 2013-06-24 ENCOUNTER — Other Ambulatory Visit (HOSPITAL_BASED_OUTPATIENT_CLINIC_OR_DEPARTMENT_OTHER): Payer: Medicaid Other | Admitting: Lab

## 2013-06-24 ENCOUNTER — Ambulatory Visit (HOSPITAL_BASED_OUTPATIENT_CLINIC_OR_DEPARTMENT_OTHER): Payer: Medicaid Other

## 2013-06-24 VITALS — BP 122/80 | HR 77 | Temp 98.0°F | Ht 75.0 in | Wt 162.7 lb

## 2013-06-24 DIAGNOSIS — C3430 Malignant neoplasm of lower lobe, unspecified bronchus or lung: Secondary | ICD-10-CM

## 2013-06-24 DIAGNOSIS — Z5111 Encounter for antineoplastic chemotherapy: Secondary | ICD-10-CM

## 2013-06-24 DIAGNOSIS — C3490 Malignant neoplasm of unspecified part of unspecified bronchus or lung: Secondary | ICD-10-CM

## 2013-06-24 DIAGNOSIS — C3491 Malignant neoplasm of unspecified part of right bronchus or lung: Secondary | ICD-10-CM

## 2013-06-24 LAB — COMPREHENSIVE METABOLIC PANEL (CC13)
AST: 67 U/L — ABNORMAL HIGH (ref 5–34)
Albumin: 3.7 g/dL (ref 3.5–5.0)
BUN: 8 mg/dL (ref 7.0–26.0)
Calcium: 9.3 mg/dL (ref 8.4–10.4)
Chloride: 100 mEq/L (ref 98–109)
Creatinine: 0.8 mg/dL (ref 0.7–1.3)
Glucose: 81 mg/dl (ref 70–140)
Potassium: 3.9 mEq/L (ref 3.5–5.1)

## 2013-06-24 LAB — CBC WITH DIFFERENTIAL/PLATELET
BASO%: 0.6 % (ref 0.0–2.0)
Basophils Absolute: 0 10*3/uL (ref 0.0–0.1)
HCT: 34.3 % — ABNORMAL LOW (ref 38.4–49.9)
HGB: 11.9 g/dL — ABNORMAL LOW (ref 13.0–17.1)
LYMPH%: 21.9 % (ref 14.0–49.0)
MCH: 37.6 pg — ABNORMAL HIGH (ref 27.2–33.4)
MCHC: 34.7 g/dL (ref 32.0–36.0)
MONO#: 0.4 10*3/uL (ref 0.1–0.9)
NEUT%: 66.5 % (ref 39.0–75.0)
Platelets: 445 10*3/uL — ABNORMAL HIGH (ref 140–400)
WBC: 4.4 10*3/uL (ref 4.0–10.3)
lymph#: 1 10*3/uL (ref 0.9–3.3)

## 2013-06-24 LAB — MAGNESIUM (CC13): Magnesium: 2.3 mg/dl (ref 1.5–2.5)

## 2013-06-24 MED ORDER — SODIUM CHLORIDE 0.9 % IV SOLN
Freq: Once | INTRAVENOUS | Status: AC
  Administered 2013-06-24: 09:00:00 via INTRAVENOUS

## 2013-06-24 MED ORDER — SODIUM CHLORIDE 0.9 % IV SOLN
500.0000 mg/m2 | Freq: Once | INTRAVENOUS | Status: AC
  Administered 2013-06-24: 975 mg via INTRAVENOUS
  Filled 2013-06-24: qty 39

## 2013-06-24 MED ORDER — ONDANSETRON 8 MG/50ML IVPB (CHCC)
8.0000 mg | Freq: Once | INTRAVENOUS | Status: AC
  Administered 2013-06-24: 8 mg via INTRAVENOUS

## 2013-06-24 NOTE — Telephone Encounter (Signed)
gv and printed appt sched and avs for pt....MW added tx   °

## 2013-06-24 NOTE — Telephone Encounter (Signed)
I have adjusted appt for 7/28. JMW

## 2013-06-24 NOTE — Patient Instructions (Signed)
[facility] Discharge Instructions for Patients Receiving Chemotherapy  Today you received the following chemotherapy agents: Alimta. To help prevent nausea and vomiting after your treatment, we encourage you to take your nausea medication, Zofran (Ondansetron). Take it every six hours as needed.   If you develop nausea and vomiting that is not controlled by your nausea medication, call the clinic.   BELOW ARE SYMPTOMS THAT SHOULD BE REPORTED IMMEDIATELY:  *FEVER GREATER THAN 100.5 F  *CHILLS WITH OR WITHOUT FEVER  NAUSEA AND VOMITING THAT IS NOT CONTROLLED WITH YOUR NAUSEA MEDICATION  *UNUSUAL SHORTNESS OF BREATH  *UNUSUAL BRUISING OR BLEEDING  TENDERNESS IN MOUTH AND THROAT WITH OR WITHOUT PRESENCE OF ULCERS  *URINARY PROBLEMS  *BOWEL PROBLEMS  UNUSUAL RASH Items with * indicate a potential emergency and should be followed up as soon as possible.  Feel free to call the clinic should you have any questions or concerns. The clinic phone number is [phone].

## 2013-06-24 NOTE — Progress Notes (Signed)
[facility] [phone]   Fax:([phone]) [phone]  OFFICE PROGRESS NOTE  Geraldo Pitter, MD [address] [place] [address]  DIAGNOSIS: Metastatic non-small cell lung cancer, adenocarcinoma with negative EGFR mutation and negative ALK gene translocation diagnosed in December of 2013.   PRIOR THERAPY:  1. Status post Pleurx catheter placement for drainage of right-sided pleural effusion under the care of Dr. Laneta Simmers on 11/29/2012.  2. Systemic chemotherapy with carboplatin for AUC of 6, paclitaxel 200 mg/M2 and Avastin 15 mg/kg every 3 weeks according to the ECoG protocol 5508. Status post 4 cycles.  CURRENT THERAPY: Maintenance chemotherapy according to the ECoG 5508 protocol, patient has been randomized to pemetrexed 500 mg per meter squared given every 3 weeks. Status post 3 cycles  INTERVAL HISTORY: Ka Flammer 52 y.o. male returns to the clinic today for followup visit accompanied his mother. The patient is feeling fine today with no specific complaints. He denied having any significant fever or chills. He denied having any chest pain, shortness breath, cough or hemoptysis. He has no significant weight loss or night sweats. The patient is tolerating his treatment with maintenance Alimta fairly well with no significant adverse effects. He had repeat CT scan of the chest performed recently and he is here for evaluation and discussion of his scan results.   MEDICAL HISTORY: Past Medical History  Diagnosis Date  . Bipolar 2 disorder   . Shortness of breath     with exertion  . Pneumothorax     right side- spring 2013  . Lung cancer   . Adenocarcinoma, lung 11/30/2012    ALLERGIES:  has No Known Allergies.  MEDICATIONS:  Current Outpatient Prescriptions  Medication Sig Dispense Refill  . lamoTRIgine (LAMICTAL) 150 MG tablet Take 150 mg by mouth at bedtime.      . mirtazapine (REMERON) 45 MG tablet Take 45 mg by mouth at bedtime.      .  Multiple Vitamin (MULTIVITAMIN WITH MINERALS) TABS Take 1 tablet by mouth daily.      Marland Kitchen PRESCRIPTION MEDICATION Inject into the vein every 21 ( twenty-one) days. Alimta infusion q3weeks.      . diphenhydrAMINE (BENADRYL) 25 mg capsule Take 25 mg by mouth every 4 (four) hours as needed for itching.       No current facility-administered medications for this visit.    SURGICAL HISTORY:  Past Surgical History  Procedure Laterality Date  . Turbinate reduction    . Chest tube insertion  11/29/2012    Procedure: INSERTION PLEURAL DRAINAGE CATHETER;  Surgeon: [name], MD;  Location: [facility] OR;  Service: Thoracic;  Laterality: Right;  . Removal of pleural drainage catheter Right 02/14/2013    Procedure: REMOVAL OF PLEURAL DRAINAGE CATHETER;  Surgeon: [name], MD;  Location: [facility] OR;  Service: Thoracic;  Laterality: Right;    REVIEW OF SYSTEMS:  A comprehensive review of systems was negative.   PHYSICAL EXAMINATION: General appearance: alert, cooperative and no distress Head: Normocephalic, without obvious abnormality, atraumatic Neck: no adenopathy Lymph nodes: Cervical, supraclavicular, and axillary nodes normal. Resp: clear to auscultation bilaterally Cardio: regular rate and rhythm, S1, S2 normal, no murmur, click, rub or gallop GI: soft, non-tender; bowel sounds normal; no masses,  no organomegaly Extremities: extremities normal, atraumatic, no cyanosis or edema Neurologic: Alert and oriented X 3, normal strength and tone. Normal symmetric reflexes. Normal coordination and gait  ECOG PERFORMANCE STATUS: 1 - Symptomatic but completely ambulatory  There were no vitals taken for this visit.  LABORATORY DATA: Lab Results  Component Value Date   WBC 4.4 06/24/2013   HGB 11.9* 06/24/2013   HCT 34.3* 06/24/2013   MCV 108.2* 06/24/2013   PLT 445* 06/24/2013      Chemistry      Component Value Date/Time   NA 136 06/03/2013 0914   NA 135 05/17/2013 1320   K 4.2 06/03/2013 0914   K 3.9  05/17/2013 1320   CL 98 06/03/2013 0914   CL 97 05/17/2013 1320   CO2 26 06/03/2013 0914   CO2 27 05/17/2013 1320   BUN 7.6 06/03/2013 0914   BUN 11 05/17/2013 1320   CREATININE 0.7 06/03/2013 0914   CREATININE 0.53 05/17/2013 1320      Component Value Date/Time   CALCIUM 9.4 06/03/2013 0914   CALCIUM 8.9 05/17/2013 1320   ALKPHOS 97 06/03/2013 0914   ALKPHOS 82 11/26/2012 0500   AST 28 06/03/2013 0914   AST 13 11/26/2012 0500   ALT 13 06/03/2013 0914   ALT 9 11/26/2012 0500   BILITOT 0.21 06/03/2013 0914   BILITOT 0.3 11/26/2012 0500       RADIOGRAPHIC STUDIES: Ct Chest W Contrast  06/22/2013   *RADIOLOGY REPORT*  Clinical Data: Restage lung cancer, RECIST 1.1  CT CHEST WITH CONTRAST  Technique:  Multidetector CT imaging of the chest was performed following the standard protocol during bolus administration of intravenous contrast.  Contrast: 80mL OMNIPAQUE IOHEXOL 300 MG/ML  SOLN  Comparison: CT chest abdomen pelvis dated 03/15/2013  ---  RECIST 1.1  Target Lesions:  1. Right lower lobe lesion - measures 15 x 10 mm (series 2/image 52), unchanged  2. Pretracheal lymph node - measures 14 x 12 mm (series 2/image 27), previously 13 x 12 mm  Non-target Lesions:  1. Subcarinal lymph node - present, measures 12 mm short axis (series 2/image 33), previously 10 mm  2. Right hilar lymph node - present, measures 5 mm short axis (series 2/image 30), previous 8 mm  3. Right anterior mediastinal lymph node - present, measures 6 mm short axis (series 2/image 21), previously 7 mm  4. Right pleural effusion - present, trace, mildly increased  ---  Findings: 5.1 x 3.9 x 6.0 cm multiloculated, mildly thick-walled gas and fluid collection in the medial right lung apex (series 2/image 13; coronal image 80), new, at the site of pre-existing thin-walled bullae.  Associated right upper lobe consolidation with air bronchograms laterally (coronal image 81) and surrounding ground glass opacity / spiculation (series 5/image 19).  The  overall appearance is favored to reflect pneumonia with infected right upper lobe bullae.  Given the spiculation, tumor with lymphangitic spread is difficult to exclude.  Trace right pleural effusion, mildly decreased.  Associated rounded atelectasis with stable 15 x 10 mm enhancing pleural-based lesion medially (series 2/image 52).  Underlying moderate paraseptal emphysematous changes.  No pleural effusion or pneumothorax.  Visualized thyroid is mildly heterogeneous.  The heart is normal in size.  No pericardial effusion.  Mediastinal lymphadenopathy is similar, including: --6 mm short-axis anterior mediastinal node (series 2/image 21) previously 7 mm --7 mm short-axis high right paratracheal node (series 2/image 22), previously 9 mm --12 mm short-axis precarinal node (series 2/image 27), unchanged --12 mm short-axis AP window node (series 2/image 28), previously 11 mm --12 mm short-axis subcarinal node (series 2/image 33), previously 10 mm  Visualized upper abdomen is unremarkable.  Visualized osseous structures are within normal limits.  IMPRESSION: 6.0  cm multiloculated gas and fluid collection in the right upper lobe with adjacent opacity / spiculation, new, as described above. The appearance is favored to reflect pneumonia with infected right upper lobe bullae, although underlying tumor with lymphangitic spread is not entirely excluded.  Consider short-term follow-up after appropriate antibiotic therapy.  15 x 10 mm pleural-based lesion in the right lower lobe with associated round atelectasis, unchanged.  Trace right pleural effusion, mildly decreased.  Stable mediastinal lymphadenopathy, as described above.  RECIST 1.1 measurements as above.   Original Report Authenticated By: Charline Bills, M.D.    ASSESSMENT AND PLAN: This is a very pleasant 52 years old white male with metastatic non-small cell lung cancer currently on maintenance and Alimta 500 mg/M2 every 3 weeks status post 3 cycles. The patient  is tolerating his treatment fairly well with no significant evidence for disease progression on his recent scan. I recommended for the patient to continue his current maintenance therapy today as scheduled. Regarding the questionable right upper lobe cavitary infection, I will start the patient on doxycycline 100 mg by mouth twice a day for 10 days. The patient would come back for followup visit in 3 weeks with the next cycle of his chemotherapy. He was advised to call immediately if he has any concerning symptoms in the interval.  All questions were answered. The patient knows to call the clinic with any problems, questions or concerns. We can certainly see the patient much sooner if necessary.  I spent 15 minutes counseling the patient face to face. The total time spent in the appointment was 25 minutes.

## 2013-06-24 NOTE — Progress Notes (Signed)
06/24/2013 Z6109, Maintenance cycle 4 Jay Watts is in the cancer center today for lab work, physical exam, and chemotherapy. He had a CT of his chest on Sat. He reports he is feeling much better since stopping taking decadron. He has been working and his appetite is good. CT scans reviewed by Dr/ Arbutus Ped. Plan made to contimue current treatment. Dr. Arbutus Ped is prescribing a course of antibiotic for treatment of an area of suspected infection in the lung seem on CT. Marthann Schiller is asymptomatic.    Questionnaires for the BMS observation study completed before his MD appointment.  Sign for infusion given to T. Adolm Joseph, Charity fundraiser.

## 2013-06-24 NOTE — Patient Instructions (Signed)
No evidence for disease progression on his recent scan.  Continue maintenance chemotherapy with single agent Alimta.

## 2013-06-25 ENCOUNTER — Telehealth: Payer: Self-pay | Admitting: *Deleted

## 2013-06-25 ENCOUNTER — Other Ambulatory Visit: Payer: Self-pay | Admitting: Internal Medicine

## 2013-06-25 DIAGNOSIS — C3491 Malignant neoplasm of unspecified part of right bronchus or lung: Secondary | ICD-10-CM

## 2013-06-25 MED ORDER — DOXYCYCLINE HYCLATE 100 MG PO CAPS: 100.0000 mg | ORAL_CAPSULE | Freq: Two times a day (BID) | ORAL | Status: DC

## 2013-06-25 NOTE — Telephone Encounter (Signed)
Received phone call this morning. He is reporting he went to [facility] yesterday afternoon to pick up a new antibiotic prescription and it was not at the pharmacy.  See Dr. Asa Lente note from 06/24/13. "I will start the patient on doxycycline 100 mg by mouth twice a day for 10 days".

## 2013-07-03 DIAGNOSIS — Z0279 Encounter for issue of other medical certificate: Secondary | ICD-10-CM

## 2013-07-04 ENCOUNTER — Telehealth: Payer: Self-pay | Admitting: *Deleted

## 2013-07-04 ENCOUNTER — Telehealth: Payer: Self-pay | Admitting: Internal Medicine

## 2013-07-04 NOTE — Telephone Encounter (Signed)
s.w. pt and advised on 7.28.14 appt...pt ok and aware

## 2013-07-04 NOTE — Telephone Encounter (Signed)
Per staff message I have adjusted 7/28 appt  JMW

## 2013-07-08 ENCOUNTER — Ambulatory Visit: Payer: Medicaid Other

## 2013-07-11 ENCOUNTER — Other Ambulatory Visit: Payer: Self-pay | Admitting: *Deleted

## 2013-07-11 ENCOUNTER — Encounter: Payer: Self-pay | Admitting: Pharmacist

## 2013-07-11 ENCOUNTER — Encounter: Payer: Self-pay | Admitting: *Deleted

## 2013-07-11 DIAGNOSIS — C3490 Malignant neoplasm of unspecified part of unspecified bronchus or lung: Secondary | ICD-10-CM

## 2013-07-15 ENCOUNTER — Other Ambulatory Visit (HOSPITAL_BASED_OUTPATIENT_CLINIC_OR_DEPARTMENT_OTHER): Payer: Medicaid Other | Admitting: Lab

## 2013-07-15 ENCOUNTER — Other Ambulatory Visit: Payer: Self-pay | Admitting: *Deleted

## 2013-07-15 ENCOUNTER — Telehealth: Payer: Self-pay | Admitting: *Deleted

## 2013-07-15 ENCOUNTER — Other Ambulatory Visit: Payer: Medicaid Other | Admitting: Lab

## 2013-07-15 ENCOUNTER — Ambulatory Visit: Payer: Medicaid Other | Admitting: Internal Medicine

## 2013-07-15 ENCOUNTER — Ambulatory Visit (HOSPITAL_BASED_OUTPATIENT_CLINIC_OR_DEPARTMENT_OTHER): Payer: Medicaid Other | Admitting: Physician Assistant

## 2013-07-15 ENCOUNTER — Encounter: Payer: Self-pay | Admitting: *Deleted

## 2013-07-15 ENCOUNTER — Telehealth: Payer: Self-pay | Admitting: Internal Medicine

## 2013-07-15 ENCOUNTER — Ambulatory Visit (HOSPITAL_BASED_OUTPATIENT_CLINIC_OR_DEPARTMENT_OTHER): Payer: Medicaid Other

## 2013-07-15 VITALS — BP 120/77 | HR 80 | Temp 97.9°F | Resp 20 | Ht 75.0 in | Wt 162.0 lb

## 2013-07-15 DIAGNOSIS — C3490 Malignant neoplasm of unspecified part of unspecified bronchus or lung: Secondary | ICD-10-CM

## 2013-07-15 DIAGNOSIS — C3430 Malignant neoplasm of lower lobe, unspecified bronchus or lung: Secondary | ICD-10-CM

## 2013-07-15 DIAGNOSIS — C3491 Malignant neoplasm of unspecified part of right bronchus or lung: Secondary | ICD-10-CM

## 2013-07-15 DIAGNOSIS — Z5111 Encounter for antineoplastic chemotherapy: Secondary | ICD-10-CM

## 2013-07-15 LAB — COMPREHENSIVE METABOLIC PANEL (CC13)
Albumin: 4.2 g/dL (ref 3.5–5.0)
Alkaline Phosphatase: 70 U/L (ref 40–150)
BUN: 4.4 mg/dL — ABNORMAL LOW (ref 7.0–26.0)
Calcium: 9 mg/dL (ref 8.4–10.4)
Chloride: 101 mEq/L (ref 98–109)
Glucose: 85 mg/dl (ref 70–140)
Potassium: 3.8 mEq/L (ref 3.5–5.1)
Sodium: 140 mEq/L (ref 136–145)
Total Protein: 8.2 g/dL (ref 6.4–8.3)

## 2013-07-15 LAB — CBC WITH DIFFERENTIAL/PLATELET
Basophils Absolute: 0 10*3/uL (ref 0.0–0.1)
Eosinophils Absolute: 0 10*3/uL (ref 0.0–0.5)
HGB: 12.2 g/dL — ABNORMAL LOW (ref 13.0–17.1)
MONO#: 0.4 10*3/uL (ref 0.1–0.9)
MONO%: 10.1 % (ref 0.0–14.0)
NEUT#: 2.2 10*3/uL (ref 1.5–6.5)
RBC: 3.12 10*6/uL — ABNORMAL LOW (ref 4.20–5.82)
RDW: 20.8 % — ABNORMAL HIGH (ref 11.0–14.6)
WBC: 3.5 10*3/uL — ABNORMAL LOW (ref 4.0–10.3)
lymph#: 0.9 10*3/uL (ref 0.9–3.3)

## 2013-07-15 LAB — LACTATE DEHYDROGENASE (CC13): LDH: 461 U/L — ABNORMAL HIGH (ref 125–245)

## 2013-07-15 MED ORDER — SODIUM CHLORIDE 0.9 % IV SOLN
500.0000 mg/m2 | Freq: Once | INTRAVENOUS | Status: AC
  Administered 2013-07-15: 975 mg via INTRAVENOUS
  Filled 2013-07-15: qty 39

## 2013-07-15 MED ORDER — CYANOCOBALAMIN 1000 MCG/ML IJ SOLN
1000.0000 ug | Freq: Once | INTRAMUSCULAR | Status: AC
  Administered 2013-07-15: 1000 ug via INTRAMUSCULAR

## 2013-07-15 MED ORDER — ONDANSETRON 8 MG/50ML IVPB (CHCC)
8.0000 mg | Freq: Once | INTRAVENOUS | Status: AC
  Administered 2013-07-15: 8 mg via INTRAVENOUS

## 2013-07-15 MED ORDER — SODIUM CHLORIDE 0.9 % IV SOLN
Freq: Once | INTRAVENOUS | Status: AC
  Administered 2013-07-15: 12:00:00 via INTRAVENOUS

## 2013-07-15 NOTE — Patient Instructions (Addendum)
[facility] Discharge Instructions for Patients Receiving Chemotherapy  Today you received the following chemotherapy agents Alimta.   To help prevent nausea and vomiting after your treatment, we encourage you to take your nausea medication if needed.   If you develop nausea and vomiting that is not controlled by your nausea medication, call the clinic.   BELOW ARE SYMPTOMS THAT SHOULD BE REPORTED IMMEDIATELY:  *FEVER GREATER THAN 100.5 F  *CHILLS WITH OR WITHOUT FEVER  NAUSEA AND VOMITING THAT IS NOT CONTROLLED WITH YOUR NAUSEA MEDICATION  *UNUSUAL SHORTNESS OF BREATH  *UNUSUAL BRUISING OR BLEEDING  TENDERNESS IN MOUTH AND THROAT WITH OR WITHOUT PRESENCE OF ULCERS  *URINARY PROBLEMS  *BOWEL PROBLEMS  UNUSUAL RASH Items with * indicate a potential emergency and should be followed up as soon as possible.  Feel free to call the clinic you have any questions or concerns. The clinic phone number is [phone].  Cyanocobalamin, Vitamin B12 injection What is this medicine? CYANOCOBALAMIN (sye an oh koe BAL a min) is a man made form of vitamin B12. Vitamin B12 is used in the growth of healthy blood cells, nerve cells, and proteins in the body. It also helps with the metabolism of fats and carbohydrates. This medicine is used to treat people who can not absorb vitamin B12. This medicine may be used for other purposes; ask your health care provider or pharmacist if you have questions. What should I tell my health care provider before I take this medicine? They need to know if you have any of these conditions: -kidney disease -Leber's disease -megaloblastic anemia -an unusual or allergic reaction to cyanocobalamin, cobalt, other medicines, foods, dyes, or preservatives -pregnant or trying to get pregnant -breast-feeding How should I use this medicine? This medicine is injected into a muscle or deeply under the skin. It is usually given by a health care  professional in a clinic or doctor's office. However, your doctor may teach you how to inject yourself. Follow all instructions. Talk to your pediatrician regarding the use of this medicine in children. Special care may be needed. Overdosage: If you think you have taken too much of this medicine contact a poison control center or emergency room at once. NOTE: This medicine is only for you. Do not share this medicine with others. What if I miss a dose? If you are given your dose at a clinic or doctor's office, call to reschedule your appointment. If you give your own injections and you miss a dose, take it as soon as you can. If it is almost time for your next dose, take only that dose. Do not take double or extra doses. What may interact with this medicine? -colchicine -heavy alcohol intake This list may not describe all possible interactions. Give your health care provider a list of all the medicines, herbs, non-prescription drugs, or dietary supplements you use. Also tell them if you smoke, drink alcohol, or use illegal drugs. Some items may interact with your medicine. What should I watch for while using this medicine? Visit your doctor or health care professional regularly. You may need blood work done while you are taking this medicine. You may need to follow a special diet. Talk to your doctor. Limit your alcohol intake and avoid smoking to get the best benefit. What side effects may I notice from receiving this medicine? Side effects that you should report to your doctor or health care professional as soon as possible: -allergic reactions like skin rash, itching or  hives, swelling of the face, lips, or tongue -blue tint to skin -chest tightness, pain -difficulty breathing, wheezing -dizziness -red, swollen painful area on the leg Side effects that usually do not require medical attention (report to your doctor or health care professional if they continue or are  bothersome): -diarrhea -headache This list may not describe all possible side effects. Call your doctor for medical advice about side effects. You may report side effects to FDA at 1-800-FDA-1088. Where should I keep my medicine? Keep out of the reach of children. Store at room temperature between 15 and 30 degrees C (59 and 85 degrees F). Protect from light. Throw away any unused medicine after the expiration date. NOTE: This sheet is a summary. It may not cover all possible information. If you have questions about this medicine, talk to your doctor, pharmacist, or health care provider.  2012, [employer]vier/Gold Standard. (03/17/2008 10:10:20 PM)

## 2013-07-15 NOTE — Progress Notes (Signed)
07/15/2013 [facility] Z6109, cycle 5, maintenance Jay Watts is in the cancer center today for lab work, physical exam, and cycle 5 of alimta on E5508. He completed PROs for the BMS observational study before seeing the provider.  Seen by Karrie Doffing. PA-C.  Sign for infusion given to A. Clovis Riley, Charity fundraiser.

## 2013-07-15 NOTE — Telephone Encounter (Signed)
S/W ANGELIA @ DR. BLAND OFFICE TO OBTAIN NPI #.

## 2013-07-15 NOTE — Telephone Encounter (Signed)
Per staff message and POF I have scheduled appts.  JMW  

## 2013-07-15 NOTE — Telephone Encounter (Signed)
gv and printed appt sched and avs for pt...emailed MW to add tx... °

## 2013-07-16 ENCOUNTER — Telehealth: Payer: Self-pay | Admitting: Internal Medicine

## 2013-07-16 NOTE — Telephone Encounter (Signed)
s.w. pt and advised on Aug appt....pt ok and aware °

## 2013-07-19 ENCOUNTER — Encounter: Payer: Self-pay | Admitting: Physician Assistant

## 2013-07-19 NOTE — Progress Notes (Signed)
[facility] Telephone:([phone]   Fax:(336) [phone]  OFFICE PROGRESS NOTE  Geraldo Pitter, MD [address] [place] [address]  DIAGNOSIS: Metastatic non-small cell lung cancer, adenocarcinoma with negative EGFR mutation and negative ALK gene translocation diagnosed in December of 2013.   PRIOR THERAPY:  1. Status post Pleurx catheter placement for drainage of right-sided pleural effusion under the care of Dr. Laneta Simmers on 11/29/2012.  2. Systemic chemotherapy with carboplatin for AUC of 6, paclitaxel 200 mg/M2 and Avastin 15 mg/kg every 3 weeks according to the ECoG protocol 5508. Status post 4 cycles.  CURRENT THERAPY: Maintenance chemotherapy according to the ECoG 5508 protocol, patient has been randomized to pemetrexed 500 mg per meter squared given every 3 weeks. Status post 4 cycles  INTERVAL HISTORY: Jay Watts 52 y.o. male returns to the clinic today for followup visit.   He denied having any significant fever or chills. He denied having any chest pain, shortness breath, cough or hemoptysis. He has no significant weight loss or night sweats. The patient is tolerating his treatment with maintenance Alimta fairly well with no significant adverse effects. He reports that he is particularly doing well without the steroids. He has had no issues with irritation, aggression, mood changes or sleep issues. He does have some mild fatigue but voices no other specific complaints. He reports that he completed his 10 day course of doxycycline as prescribed.   MEDICAL HISTORY: Past Medical History  Diagnosis Date  . Bipolar 2 disorder   . Shortness of breath     with exertion  . Pneumothorax     right side- spring 2013  . Lung cancer   . Adenocarcinoma, lung 11/30/2012    ALLERGIES:  has No Known Allergies.  MEDICATIONS:  Current Outpatient Prescriptions  Medication Sig Dispense Refill  . diphenhydrAMINE (BENADRYL) 25 mg capsule Take 25 mg by mouth  every 4 (four) hours as needed for itching.      Marland Kitchen doxycycline (VIBRAMYCIN) 100 MG capsule Take 1 capsule (100 mg total) by mouth 2 (two) times daily.  20 capsule  0  . lamoTRIgine (LAMICTAL) 150 MG tablet Take 150 mg by mouth at bedtime.      . mirtazapine (REMERON) 45 MG tablet Take 45 mg by mouth at bedtime.      . Multiple Vitamin (MULTIVITAMIN WITH MINERALS) TABS Take 1 tablet by mouth daily.      Marland Kitchen PRESCRIPTION MEDICATION Inject into the vein every 21 ( twenty-one) days. Alimta infusion q3weeks.       No current facility-administered medications for this visit.    SURGICAL HISTORY:  Past Surgical History  Procedure Laterality Date  . Turbinate reduction    . Chest tube insertion  11/29/2012    Procedure: INSERTION PLEURAL DRAINAGE CATHETER;  Surgeon: [name], MD;  Location: [facility];  Service: Thoracic;  Laterality: Right;  . Removal of pleural drainage catheter Right 02/14/2013    Procedure: REMOVAL OF PLEURAL DRAINAGE CATHETER;  Surgeon: [name], MD;  Location: [facility] OR;  Service: Thoracic;  Laterality: Right;    REVIEW OF SYSTEMS:  A comprehensive review of systems was negative.   PHYSICAL EXAMINATION: General appearance: alert, cooperative and no distress Head: Normocephalic, without obvious abnormality, atraumatic Neck: no adenopathy Lymph nodes: Cervical, supraclavicular, and axillary nodes normal. Resp: clear to auscultation bilaterally Cardio: regular rate and rhythm, S1, S2 normal, no murmur, click, rub or gallop GI: soft, non-tender; bowel sounds normal;  no masses,  no organomegaly Extremities: extremities normal, atraumatic, no cyanosis or edema Neurologic: Alert and oriented X 3, normal strength and tone. Normal symmetric reflexes. Normal coordination and gait  ECOG PERFORMANCE STATUS: 1 - Symptomatic but completely ambulatory  Blood pressure 120/77, pulse 80, temperature 97.9 F (36.6 C), temperature source Oral, resp. rate 20, height 6\' 3"  (1.905 m),  weight 162 lb (73.483 kg).  LABORATORY DATA: Lab Results  Component Value Date   WBC 3.5* 07/15/2013   HGB 12.2* 07/15/2013   HCT 34.8* 07/15/2013   MCV 111.4* 07/15/2013   PLT 483* 07/15/2013      Chemistry      Component Value Date/Time   NA 140 07/15/2013 0940   NA 135 05/17/2013 1320   K 3.8 07/15/2013 0940   K 3.9 05/17/2013 1320   CL 98 06/03/2013 0914   CL 97 05/17/2013 1320   CO2 24 07/15/2013 0940   CO2 27 05/17/2013 1320   BUN 4.4* 07/15/2013 0940   BUN 11 05/17/2013 1320   CREATININE 0.8 07/15/2013 0940   CREATININE 0.53 05/17/2013 1320      Component Value Date/Time   CALCIUM 9.0 07/15/2013 0940   CALCIUM 8.9 05/17/2013 1320   ALKPHOS 70 07/15/2013 0940   ALKPHOS 82 11/26/2012 0500   AST 114* 07/15/2013 0940   AST 13 11/26/2012 0500   ALT 36 07/15/2013 0940   ALT 9 11/26/2012 0500   BILITOT 0.21 07/15/2013 0940   BILITOT 0.3 11/26/2012 0500       RADIOGRAPHIC STUDIES: Ct Chest W Contrast  06/22/2013   *RADIOLOGY REPORT*  Clinical Data: Restage lung cancer, RECIST 1.1  CT CHEST WITH CONTRAST  Technique:  Multidetector CT imaging of the chest was performed following the standard protocol during bolus administration of intravenous contrast.  Contrast: 80mL OMNIPAQUE IOHEXOL 300 MG/ML  SOLN  Comparison: CT chest abdomen pelvis dated 03/15/2013  ---  RECIST 1.1  Target Lesions:  1. Right lower lobe lesion - measures 15 x 10 mm (series 2/image 52), unchanged  2. Pretracheal lymph node - measures 14 x 12 mm (series 2/image 27), previously 13 x 12 mm  Non-target Lesions:  1. Subcarinal lymph node - present, measures 12 mm short axis (series 2/image 33), previously 10 mm  2. Right hilar lymph node - present, measures 5 mm short axis (series 2/image 30), previous 8 mm  3. Right anterior mediastinal lymph node - present, measures 6 mm short axis (series 2/image 21), previously 7 mm  4. Right pleural effusion - present, trace, mildly increased  ---  Findings: 5.1 x 3.9 x 6.0 cm multiloculated,  mildly thick-walled gas and fluid collection in the medial right lung apex (series 2/image 13; coronal image 80), new, at the site of pre-existing thin-walled bullae.  Associated right upper lobe consolidation with air bronchograms laterally (coronal image 81) and surrounding ground glass opacity / spiculation (series 5/image 19).  The overall appearance is favored to reflect pneumonia with infected right upper lobe bullae.  Given the spiculation, tumor with lymphangitic spread is difficult to exclude.  Trace right pleural effusion, mildly decreased.  Associated rounded atelectasis with stable 15 x 10 mm enhancing pleural-based lesion medially (series 2/image 52).  Underlying moderate paraseptal emphysematous changes.  No pleural effusion or pneumothorax.  Visualized thyroid is mildly heterogeneous.  The heart is normal in size.  No pericardial effusion.  Mediastinal lymphadenopathy is similar, including: --6 mm short-axis anterior mediastinal node (series 2/image 21) previously 7 mm --7 mm short-axis  high right paratracheal node (series 2/image 22), previously 9 mm --12 mm short-axis precarinal node (series 2/image 27), unchanged --12 mm short-axis AP window node (series 2/image 28), previously 11 mm --12 mm short-axis subcarinal node (series 2/image 33), previously 10 mm  Visualized upper abdomen is unremarkable.  Visualized osseous structures are within normal limits.  IMPRESSION: 6.0 cm multiloculated gas and fluid collection in the right upper lobe with adjacent opacity / spiculation, new, as described above. The appearance is favored to reflect pneumonia with infected right upper lobe bullae, although underlying tumor with lymphangitic spread is not entirely excluded.  Consider short-term follow-up after appropriate antibiotic therapy.  15 x 10 mm pleural-based lesion in the right lower lobe with associated round atelectasis, unchanged.  Trace right pleural effusion, mildly decreased.  Stable mediastinal  lymphadenopathy, as described above.  RECIST 1.1 measurements as above.   Original Report Authenticated By: Charline Bills, M.D.    ASSESSMENT AND PLAN: This is a very pleasant 52 years old white male with metastatic non-small cell lung cancer currently on maintenance and Alimta 500 mg/M2 every 3 weeks status post 4 cycles. The patient is tolerating his treatment fairly well with no significant evidence for disease progression on his recent scan. The patient was discussed with Dr. Arbutus Ped. Of note his AST is elevated at about a grade 2 and his LDH is elevated at a little less than twice the upper limit of normal range. We will continue to closely monitor these parameters on subsequent visits. He'll proceed with cycle #5 of his maintenance chemotherapy according to the ECoG 04/23/2007 protocol with pemetrexed at 500 mg meter squared given every 3 weeks. He'll continue this without steroids for premedication as previously noted. He'll followup in 3 weeks with another CBC differential and C. met prior to cycle #6 of his maintenance chemotherapy with single agent pemetrexed.  Dolly Harbach E, PA-C   He was advised to call immediately if he has any concerning symptoms in the interval.  All questions were answered. The patient knows to call the clinic with any problems, questions or concerns. We can certainly see the patient much sooner if necessary.  I spent 20 minutes counseling the patient face to face. The total time spent in the appointment was 30 minutes.

## 2013-07-19 NOTE — Patient Instructions (Addendum)
Follow up in 3 weeks, prior to your next scheduled cycle of chemotherapy 

## 2013-08-05 ENCOUNTER — Encounter: Payer: Self-pay | Admitting: Internal Medicine

## 2013-08-05 ENCOUNTER — Other Ambulatory Visit (HOSPITAL_BASED_OUTPATIENT_CLINIC_OR_DEPARTMENT_OTHER): Payer: Medicaid Other

## 2013-08-05 ENCOUNTER — Ambulatory Visit (HOSPITAL_BASED_OUTPATIENT_CLINIC_OR_DEPARTMENT_OTHER): Payer: Medicaid Other | Admitting: Internal Medicine

## 2013-08-05 ENCOUNTER — Other Ambulatory Visit: Payer: Medicaid Other | Admitting: Lab

## 2013-08-05 ENCOUNTER — Other Ambulatory Visit: Payer: Self-pay | Admitting: *Deleted

## 2013-08-05 ENCOUNTER — Ambulatory Visit: Payer: Medicaid Other | Admitting: Internal Medicine

## 2013-08-05 ENCOUNTER — Telehealth: Payer: Self-pay | Admitting: *Deleted

## 2013-08-05 ENCOUNTER — Ambulatory Visit (HOSPITAL_BASED_OUTPATIENT_CLINIC_OR_DEPARTMENT_OTHER): Payer: Medicaid Other

## 2013-08-05 ENCOUNTER — Encounter: Payer: Self-pay | Admitting: *Deleted

## 2013-08-05 VITALS — BP 125/83 | HR 82 | Temp 97.4°F | Resp 20 | Ht 75.0 in | Wt 163.4 lb

## 2013-08-05 DIAGNOSIS — C3430 Malignant neoplasm of lower lobe, unspecified bronchus or lung: Secondary | ICD-10-CM

## 2013-08-05 DIAGNOSIS — C3491 Malignant neoplasm of unspecified part of right bronchus or lung: Secondary | ICD-10-CM

## 2013-08-05 DIAGNOSIS — C3490 Malignant neoplasm of unspecified part of unspecified bronchus or lung: Secondary | ICD-10-CM

## 2013-08-05 DIAGNOSIS — C778 Secondary and unspecified malignant neoplasm of lymph nodes of multiple regions: Secondary | ICD-10-CM

## 2013-08-05 DIAGNOSIS — N042 Nephrotic syndrome with diffuse membranous glomerulonephritis: Secondary | ICD-10-CM

## 2013-08-05 LAB — CBC WITH DIFFERENTIAL/PLATELET
BASO%: 0.2 % (ref 0.0–2.0)
Basophils Absolute: 0 10*3/uL (ref 0.0–0.1)
EOS%: 1.2 % (ref 0.0–7.0)
HCT: 36.4 % — ABNORMAL LOW (ref 38.4–49.9)
HGB: 12.8 g/dL — ABNORMAL LOW (ref 13.0–17.1)
MCH: 40 pg — ABNORMAL HIGH (ref 27.2–33.4)
MCHC: 35.2 g/dL (ref 32.0–36.0)
MCV: 113.8 fL — ABNORMAL HIGH (ref 79.3–98.0)
MONO%: 12.6 % (ref 0.0–14.0)
NEUT%: 58.1 % (ref 39.0–75.0)
RDW: 19.3 % — ABNORMAL HIGH (ref 11.0–14.6)

## 2013-08-05 LAB — COMPREHENSIVE METABOLIC PANEL (CC13)
AST: 104 U/L — ABNORMAL HIGH (ref 5–34)
Alkaline Phosphatase: 74 U/L (ref 40–150)
BUN: 6 mg/dL — ABNORMAL LOW (ref 7.0–26.0)
Creatinine: 1.1 mg/dL (ref 0.7–1.3)

## 2013-08-05 MED ORDER — SODIUM CHLORIDE 0.9 % IV SOLN
500.0000 mg/m2 | Freq: Once | INTRAVENOUS | Status: AC
  Administered 2013-08-05: 975 mg via INTRAVENOUS
  Filled 2013-08-05: qty 39

## 2013-08-05 MED ORDER — SODIUM CHLORIDE 0.9 % IV SOLN
Freq: Once | INTRAVENOUS | Status: AC
  Administered 2013-08-05: 10:00:00 via INTRAVENOUS

## 2013-08-05 MED ORDER — ONDANSETRON 8 MG/50ML IVPB (CHCC)
8.0000 mg | Freq: Once | INTRAVENOUS | Status: AC
  Administered 2013-08-05: 8 mg via INTRAVENOUS

## 2013-08-05 NOTE — Telephone Encounter (Signed)
Per staff message and POF I have scheduled appts.  JMW  

## 2013-08-05 NOTE — Progress Notes (Signed)
[facility] Telephone:(336) [phone]   Fax:(336) [phone]  OFFICE PROGRESS NOTE  Geraldo Pitter, MD [address] [place] [address]  DIAGNOSIS: Metastatic non-small cell lung cancer, adenocarcinoma with negative EGFR mutation and negative ALK gene translocation diagnosed in December of 2013.   PRIOR THERAPY:  1. Status post Pleurx catheter placement for drainage of right-sided pleural effusion under the care of Dr. Laneta Simmers on 11/29/2012.  2. Systemic chemotherapy with carboplatin for AUC of 6, paclitaxel 200 mg/M2 and Avastin 15 mg/kg every 3 weeks according to the ECoG protocol 5508. Status post 4 cycles.  CURRENT THERAPY: Maintenance chemotherapy according to the ECoG 5508 protocol, patient has been randomized to pemetrexed 500 mg per meter squared given every 3 weeks. Status post 5 cycles.   CHEMOTHERAPY INTENT: Palliative  CURRENT # OF CHEMOTHERAPY CYCLES: 5  CURRENT ANTIEMETICS: Zofran, dexamethasone and Compazine  CURRENT SMOKING STATUS: Former smoker quit 11/25/2012  ORAL CHEMOTHERAPY AND CONSENT: None  CURRENT BISPHOSPHONATES USE: None  PAIN MANAGEMENT: 0/10  NARCOTICS INDUCED CONSTIPATION: None  LIVING WILL AND CODE STATUS: No CODE BLUE  INTERVAL HISTORY: Jay Watts 52 y.o. male returns to the clinic today for followup visit. The patient related the last cycle of his treatment fairly well with no significant adverse effects. He denied having any fever or chills. He denied having any nausea or vomiting. He has no chest pain, shortness breath, cough or hemoptysis. The patient is here today to start cycle #6 of his chemotherapy.  MEDICAL HISTORY: Past Medical History  Diagnosis Date  . Bipolar 2 disorder   . Shortness of breath     with exertion  . Pneumothorax     right side- spring 2013  . Lung cancer   . Adenocarcinoma, lung 11/30/2012    ALLERGIES:  has No Known Allergies.  MEDICATIONS:  Current Outpatient Prescriptions   Medication Sig Dispense Refill  . lamoTRIgine (LAMICTAL) 150 MG tablet Take 150 mg by mouth at bedtime.      . mirtazapine (REMERON) 45 MG tablet Take 45 mg by mouth at bedtime.      . Multiple Vitamin (MULTIVITAMIN WITH MINERALS) TABS Take 1 tablet by mouth daily.      Marland Kitchen PRESCRIPTION MEDICATION Inject into the vein every 21 ( twenty-one) days. Alimta infusion q3weeks.       No current facility-administered medications for this visit.    SURGICAL HISTORY:  Past Surgical History  Procedure Laterality Date  . Turbinate reduction    . Chest tube insertion  11/29/2012    Procedure: INSERTION PLEURAL DRAINAGE CATHETER;  Surgeon: [name], MD;  Location: [facility] OR;  Service: Thoracic;  Laterality: Right;  . Removal of pleural drainage catheter Right 02/14/2013    Procedure: REMOVAL OF PLEURAL DRAINAGE CATHETER;  Surgeon: [name], MD;  Location: [facility] OR;  Service: Thoracic;  Laterality: Right;    REVIEW OF SYSTEMS:  A comprehensive review of systems was negative.   PHYSICAL EXAMINATION: General appearance: alert, cooperative, fatigued and no distress Head: Normocephalic, without obvious abnormality, atraumatic Neck: no adenopathy Lymph nodes: Cervical, supraclavicular, and axillary nodes normal. Resp: clear to auscultation bilaterally Cardio: regular rate and rhythm, S1, S2 normal, no murmur, click, rub or gallop GI: soft, non-tender; bowel sounds normal; no masses,  no organomegaly Extremities: extremities normal, atraumatic, no cyanosis or edema  ECOG PERFORMANCE STATUS: 0 - Asymptomatic  Blood pressure 125/83, pulse 82, temperature 97.4 F (36.3 C), temperature source Oral, resp.  rate 20, height 6\' 3"  (1.905 m), weight 163 lb 6.4 oz (74.118 kg).  LABORATORY DATA: Lab Results  Component Value Date   WBC 4.2 08/05/2013   HGB 12.8* 08/05/2013   HCT 36.4* 08/05/2013   MCV 113.8* 08/05/2013   PLT 523* 08/05/2013      Chemistry      Component Value Date/Time   NA 136  08/05/2013 0803   NA 135 05/17/2013 1320   K 4.4 08/05/2013 0803   K 3.9 05/17/2013 1320   CL 98 06/03/2013 0914   CL 97 05/17/2013 1320   CO2 27 08/05/2013 0803   CO2 27 05/17/2013 1320   BUN 6.0* 08/05/2013 0803   BUN 11 05/17/2013 1320   CREATININE 1.1 08/05/2013 0803   CREATININE 0.53 05/17/2013 1320      Component Value Date/Time   CALCIUM 10.1 08/05/2013 0803   CALCIUM 8.9 05/17/2013 1320   ALKPHOS 74 08/05/2013 0803   ALKPHOS 82 11/26/2012 0500   AST 104* 08/05/2013 0803   AST 13 11/26/2012 0500   ALT 45 08/05/2013 0803   ALT 9 11/26/2012 0500   BILITOT 0.30 08/05/2013 0803   BILITOT 0.3 11/26/2012 0500       RADIOGRAPHIC STUDIES: No results found.  ASSESSMENT AND PLAN: This is a very pleasant 52 years old white male with metastatic non-small cell lung cancer, adenocarcinoma currently undergoing maintenance chemotherapy with single agent Alimta according to the ECoG trial 5508, status post 5 cycles. The patient is rating his treatment fairly well with no significant adverse effects. I recommended for the patient to proceed with cycle #6 today as scheduled. The patient would come back for followup visit in 3 weeks with repeat CT scan of the chest for restaging of his disease. He was advised to call immediately if he has any concerning symptoms in the interval.  The patient voices understanding of current disease status and treatment options and is in agreement with the current care plan.  All questions were answered. The patient knows to call the clinic with any problems, questions or concerns. We can certainly see the patient much sooner if necessary.

## 2013-08-05 NOTE — Patient Instructions (Addendum)
[facility] Discharge Instructions for Patients Receiving Chemotherapy  Today you received the following chemotherapy agents: Alimta  To help prevent nausea and vomiting after your treatment, we encourage you to take your nausea medication as directed by your physician  If you develop nausea and vomiting that is not controlled by your nausea medication, call the clinic.   BELOW ARE SYMPTOMS THAT SHOULD BE REPORTED IMMEDIATELY:  *FEVER GREATER THAN 100.5 F  *CHILLS WITH OR WITHOUT FEVER  NAUSEA AND VOMITING THAT IS NOT CONTROLLED WITH YOUR NAUSEA MEDICATION  *UNUSUAL SHORTNESS OF BREATH  *UNUSUAL BRUISING OR BLEEDING  TENDERNESS IN MOUTH AND THROAT WITH OR WITHOUT PRESENCE OF ULCERS  *URINARY PROBLEMS  *BOWEL PROBLEMS  UNUSUAL RASH Items with * indicate a potential emergency and should be followed up as soon as possible.  Feel free to call the clinic you have any questions or concerns. The clinic phone number is [phone].

## 2013-08-05 NOTE — Progress Notes (Signed)
I have verified treatment parameters with Corrie Dandy, Rn research; OK to proceed with planned chemotherapy today. Clayborn Heron, RN

## 2013-08-05 NOTE — Progress Notes (Signed)
08/05/2013 [id number], cycle 6, maintenance Jay Watts is in the cancer center today for lab work, physical exam, and cycle 6 of maintenance chemotherapy. He completed a set of PROs for the BMS [id number] study before seeing the doctor.  He denies any complaints at this visit except continuing fatigue, not increased from previously.  His hair has grown back. He continues to work but limits his hours based on his level of fatigue. He does report fatigue today but reports it is relieved with rest.  He is seen by Dr. Arbutus Ped who has reviewed the lab results and finds them satisfactory for treatment today.  Sign for infusion given to L. Azucena Cecil, Charity fundraiser.

## 2013-08-20 ENCOUNTER — Other Ambulatory Visit: Payer: Self-pay | Admitting: *Deleted

## 2013-08-20 ENCOUNTER — Encounter: Payer: Self-pay | Admitting: *Deleted

## 2013-08-20 DIAGNOSIS — C3490 Malignant neoplasm of unspecified part of unspecified bronchus or lung: Secondary | ICD-10-CM

## 2013-08-23 ENCOUNTER — Encounter: Payer: Self-pay | Admitting: Pharmacist

## 2013-08-23 ENCOUNTER — Ambulatory Visit (HOSPITAL_COMMUNITY)
Admission: RE | Admit: 2013-08-23 | Discharge: 2013-08-23 | Disposition: A | Discharge status: Home / Self Care | Payer: Self-pay | Source: Ambulatory Visit | Attending: Internal Medicine | Admitting: Internal Medicine

## 2013-08-23 DIAGNOSIS — J90 Pleural effusion, not elsewhere classified: Secondary | ICD-10-CM | POA: Insufficient documentation

## 2013-08-23 DIAGNOSIS — R599 Enlarged lymph nodes, unspecified: Secondary | ICD-10-CM | POA: Insufficient documentation

## 2013-08-23 DIAGNOSIS — Z79899 Other long term (current) drug therapy: Secondary | ICD-10-CM | POA: Insufficient documentation

## 2013-08-23 DIAGNOSIS — C3490 Malignant neoplasm of unspecified part of unspecified bronchus or lung: Secondary | ICD-10-CM | POA: Insufficient documentation

## 2013-08-23 MED ORDER — IOHEXOL 300 MG/ML  SOLN
80.0000 mL | Freq: Once | INTRAMUSCULAR | Status: AC | PRN
  Administered 2013-08-23: 80 mL via INTRAVENOUS

## 2013-08-26 ENCOUNTER — Telehealth: Payer: Self-pay | Admitting: *Deleted

## 2013-08-26 ENCOUNTER — Ambulatory Visit (HOSPITAL_BASED_OUTPATIENT_CLINIC_OR_DEPARTMENT_OTHER): Payer: Medicaid Other | Admitting: Internal Medicine

## 2013-08-26 ENCOUNTER — Encounter: Payer: Self-pay | Admitting: *Deleted

## 2013-08-26 ENCOUNTER — Telehealth: Payer: Self-pay | Admitting: Internal Medicine

## 2013-08-26 ENCOUNTER — Other Ambulatory Visit (HOSPITAL_BASED_OUTPATIENT_CLINIC_OR_DEPARTMENT_OTHER): Payer: Medicaid Other | Admitting: Lab

## 2013-08-26 ENCOUNTER — Encounter: Payer: Self-pay | Admitting: Internal Medicine

## 2013-08-26 ENCOUNTER — Ambulatory Visit (HOSPITAL_BASED_OUTPATIENT_CLINIC_OR_DEPARTMENT_OTHER): Payer: Medicaid Other

## 2013-08-26 VITALS — BP 117/82 | HR 83 | Temp 97.5°F | Resp 20 | Ht 75.0 in | Wt 161.8 lb

## 2013-08-26 DIAGNOSIS — C3430 Malignant neoplasm of lower lobe, unspecified bronchus or lung: Secondary | ICD-10-CM

## 2013-08-26 DIAGNOSIS — C3490 Malignant neoplasm of unspecified part of unspecified bronchus or lung: Secondary | ICD-10-CM

## 2013-08-26 DIAGNOSIS — C3491 Malignant neoplasm of unspecified part of right bronchus or lung: Secondary | ICD-10-CM

## 2013-08-26 DIAGNOSIS — Z5111 Encounter for antineoplastic chemotherapy: Secondary | ICD-10-CM

## 2013-08-26 LAB — CBC WITH DIFFERENTIAL/PLATELET
BASO%: 0.4 % (ref 0.0–2.0)
EOS%: 1 % (ref 0.0–7.0)
HCT: 34.3 % — ABNORMAL LOW (ref 38.4–49.9)
LYMPH%: 37.1 % (ref 14.0–49.0)
MCH: 40.5 pg — ABNORMAL HIGH (ref 27.2–33.4)
MCHC: 36 g/dL (ref 32.0–36.0)
MCV: 112.5 fL — ABNORMAL HIGH (ref 79.3–98.0)
MONO#: 0.4 10*3/uL (ref 0.1–0.9)
MONO%: 11.7 % (ref 0.0–14.0)
NEUT%: 49.8 % (ref 39.0–75.0)
Platelets: 558 10*3/uL — ABNORMAL HIGH (ref 140–400)
RBC: 3.05 10*6/uL — ABNORMAL LOW (ref 4.20–5.82)
WBC: 3.7 10*3/uL — ABNORMAL LOW (ref 4.0–10.3)

## 2013-08-26 LAB — COMPREHENSIVE METABOLIC PANEL (CC13)
ALT: 51 U/L (ref 0–55)
Alkaline Phosphatase: 93 U/L (ref 40–150)
CO2: 30 mEq/L — ABNORMAL HIGH (ref 22–29)
Creatinine: 1.3 mg/dL (ref 0.7–1.3)
Sodium: 140 mEq/L (ref 136–145)
Total Bilirubin: 0.44 mg/dL (ref 0.20–1.20)
Total Protein: 8.2 g/dL (ref 6.4–8.3)

## 2013-08-26 LAB — LACTATE DEHYDROGENASE (CC13): LDH: 441 U/L — ABNORMAL HIGH (ref 125–245)

## 2013-08-26 LAB — RESEARCH LABS

## 2013-08-26 MED ORDER — ONDANSETRON 8 MG/50ML IVPB (CHCC)
8.0000 mg | Freq: Once | INTRAVENOUS | Status: AC
  Administered 2013-08-26: 8 mg via INTRAVENOUS

## 2013-08-26 MED ORDER — SODIUM CHLORIDE 0.9 % IV SOLN
Freq: Once | INTRAVENOUS | Status: AC
  Administered 2013-08-26: 11:00:00 via INTRAVENOUS

## 2013-08-26 MED ORDER — SODIUM CHLORIDE 0.9 % IV SOLN
500.0000 mg/m2 | Freq: Once | INTRAVENOUS | Status: AC
  Administered 2013-08-26: 975 mg via INTRAVENOUS
  Filled 2013-08-26: qty 39

## 2013-08-26 MED ORDER — ONDANSETRON 8 MG/NS 50 ML IVPB
INTRAVENOUS | Status: AC
  Filled 2013-08-26: qty 8

## 2013-08-26 NOTE — Progress Notes (Signed)
08/26/13 at 11:06 am - [id number] cycle 7, day 1 study notes- The pt was in the cancer center for lab work, physical exam, and cycle 7 of maintenance chemotherapy.  He completed a set of PRO's for the BMS CA [id number] study before seeing Dr. Arbutus Ped.  He denies any complaints at his visit.  He states that he is not working as much which stresses him due to financial issues.  Emotional support was given to the pt.  He was seen by Dr. Arbutus Ped who has reviewed his lab results and finds them satisfactory for treatment today.  The pt also had scans performed on 08/23/13.  Dr. Arbutus Ped reviewed the scans, and he states that the scans do not show clear progressive disease.  Dr. Arbutus Ped reviewed the RECIST worksheet, and he states that in his clinical opinion the pt has stable disease.  Therefore, Dr. Arbutus Ped wants the pt to continue on with cycle 7 maintenance therapy today.  The infusion sign was given to Yetta Glassman, chemo nurse.  The pt was given his cycle 8 appointments.

## 2013-08-26 NOTE — Progress Notes (Signed)
08/26/13 at 10:38am -Post- enrollment visit - The pt was into the cancer center this am for his cycle 7 maintenance treatment.  The pt was given his questionnaires to prior to his lab appointment.  The pt was instructed on how to complete the PRO's (patient reported outcomes).  The pt confirmed that he completed his questionnaires prior to his MD appointment.  The research nurse reviewed the questionnaires for completeness and accuracy.  The pt's research blood was drawn today prior to his treatment.  The pt denies any new adverse events.  The pt also denies any hospitalizations since his last visit.  The pt was thanked for his continued support of this study.

## 2013-08-26 NOTE — Telephone Encounter (Signed)
Per staff message and POF I have scheduled appts.  JMW  

## 2013-08-26 NOTE — Patient Instructions (Addendum)
[facility] Discharge Instructions for Patients Receiving Chemotherapy  Today you received the following chemotherapy agents Alimta  To help prevent nausea and vomiting after your treatment, we encourage you to take your nausea medication as directed.   If you develop nausea and vomiting that is not controlled by your nausea medication, call the clinic.   BELOW ARE SYMPTOMS THAT SHOULD BE REPORTED IMMEDIATELY:  *FEVER GREATER THAN 100.5 F  *CHILLS WITH OR WITHOUT FEVER  NAUSEA AND VOMITING THAT IS NOT CONTROLLED WITH YOUR NAUSEA MEDICATION  *UNUSUAL SHORTNESS OF BREATH  *UNUSUAL BRUISING OR BLEEDING  TENDERNESS IN MOUTH AND THROAT WITH OR WITHOUT PRESENCE OF ULCERS  *URINARY PROBLEMS  *BOWEL PROBLEMS  UNUSUAL RASH Items with * indicate a potential emergency and should be followed up as soon as possible.  Feel free to call the clinic you have any questions or concerns. The clinic phone number is [phone].    

## 2013-08-26 NOTE — Patient Instructions (Signed)
CURRENT THERAPY: Maintenance chemotherapy according to the ECoG 5508 protocol, patient has been randomized to pemetrexed 500 mg per meter squared given every 3 weeks. Status post 6 cycles.  CHEMOTHERAPY INTENT: Palliative  CURRENT # OF CHEMOTHERAPY CYCLES: 7  CURRENT ANTIEMETICS: Zofran, dexamethasone and Compazine  CURRENT SMOKING STATUS: Former smoker quit 11/25/2012  ORAL CHEMOTHERAPY AND CONSENT: None  CURRENT BISPHOSPHONATES USE: None  PAIN MANAGEMENT: 0/10  NARCOTICS INDUCED CONSTIPATION: None  LIVING WILL AND CODE STATUS: No CODE BLUE

## 2013-08-26 NOTE — Progress Notes (Signed)
[facility] [phone]   [phone]  OFFICE PROGRESS NOTE  Geraldo Pitter, MD [address] [place] [address]  DIAGNOSIS: Metastatic non-small cell lung cancer, adenocarcinoma with negative EGFR mutation and negative ALK gene translocation diagnosed in December of 2013.   PRIOR THERAPY:  1. Status post Pleurx catheter placement for drainage of right-sided pleural effusion under the care of Dr. Laneta Simmers on 11/29/2012.  2. Systemic chemotherapy with carboplatin for AUC of 6, paclitaxel 200 mg/M2 and Avastin 15 mg/kg every 3 weeks according to the ECoG protocol 5508. Status post 4 cycles.  CURRENT THERAPY: Maintenance chemotherapy according to the ECoG 5508 protocol, patient has been randomized to pemetrexed 500 mg per meter squared given every 3 weeks. Status post 6 cycles.   CHEMOTHERAPY INTENT: Palliative  CURRENT # OF CHEMOTHERAPY CYCLES: 7 CURRENT ANTIEMETICS: Zofran, dexamethasone and Compazine  CURRENT SMOKING STATUS: Former smoker quit 11/25/2012  ORAL CHEMOTHERAPY AND CONSENT: None  CURRENT BISPHOSPHONATES USE: None  PAIN MANAGEMENT: 0/10  NARCOTICS INDUCED CONSTIPATION: None  LIVING WILL AND CODE STATUS: No CODE BLUE   INTERVAL HISTORY: Larri Yehle 52 y.o. male returns to the clinic today for followup visit. The patient is feeling fine today with no specific complaints except for mild fatigue. 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 He tolerated his last cycle of the chemotherapy with single agent Alimta fairly well. The patient denied having any significant nausea or vomiting. He has no fever or chills. He denied having any significant chest pain, shortness breath, cough or hemoptysis. He has repeat CT scan of the chest performed recently and he is here for evaluation and discussion of his scan results.  MEDICAL HISTORY: Past  Medical History  Diagnosis Date  . Bipolar 2 disorder   . Shortness of breath     with exertion  . Pneumothorax     right side- spring 2013  . Lung cancer   . Adenocarcinoma, lung 11/30/2012    ALLERGIES:  has No Known Allergies.  MEDICATIONS:  Current Outpatient Prescriptions  Medication Sig Dispense Refill  . lamoTRIgine (LAMICTAL) 150 MG tablet Take 150 mg by mouth at bedtime.      . mirtazapine (REMERON) 45 MG tablet Take 45 mg by mouth at bedtime.      . Multiple Vitamin (MULTIVITAMIN WITH MINERALS) TABS Take 1 tablet by mouth daily.      Marland Kitchen PRESCRIPTION MEDICATION Inject into the vein every 21 ( twenty-one) days. Alimta infusion q3weeks.       No current facility-administered medications for this visit.    SURGICAL HISTORY:  Past Surgical History  Procedure Laterality Date  . Turbinate reduction    . Chest tube insertion  11/29/2012    Procedure: INSERTION PLEURAL DRAINAGE CATHETER;  Surgeon: [name], MD;  Location: [facility] OR;  Service: Thoracic;  Laterality: Right;  . Removal of pleural drainage catheter Right 02/14/2013    Procedure: REMOVAL OF PLEURAL DRAINAGE CATHETER;  Surgeon: [name], MD;  Location: [facility] OR;  Service: Thoracic;  Laterality: Right;    REVIEW OF SYSTEMS:  A comprehensive review of systems was negative except for: Constitutional: positive for fatigue   PHYSICAL EXAMINATION: General appearance: alert, cooperative, fatigued and no distress Head: Normocephalic, without obvious abnormality, atraumatic Neck: no adenopathy Lymph nodes: Cervical, supraclavicular, and axillary nodes normal. Resp: clear to auscultation  bilaterally Cardio: regular rate and rhythm, S1, S2 normal, no murmur, click, rub or gallop GI: soft, non-tender; bowel sounds normal; no masses,  no organomegaly Extremities: extremities normal, atraumatic, no cyanosis or edema Neurologic: Alert and oriented X 3, normal strength and tone. Normal symmetric reflexes. Normal  coordination and gait  ECOG PERFORMANCE STATUS: 1 - Symptomatic but completely ambulatory  Blood pressure 117/82, pulse 83, temperature 97.5 F (36.4 C), temperature source Oral, resp. rate 20, height 6\' 3"  (1.905 m), weight 161 lb 12.8 oz (73.392 kg).  LABORATORY DATA: Lab Results  Component Value Date   WBC 3.7* 08/26/2013   HGB 12.4* 08/26/2013   HCT 34.3* 08/26/2013   MCV 112.5* 08/26/2013   PLT 558* 08/26/2013      Chemistry      Component Value Date/Time   NA 136 08/05/2013 0803   NA 135 05/17/2013 1320   K 4.4 08/05/2013 0803   K 3.9 05/17/2013 1320   CL 98 06/03/2013 0914   CL 97 05/17/2013 1320   CO2 27 08/05/2013 0803   CO2 27 05/17/2013 1320   BUN 6.0* 08/05/2013 0803   BUN 11 05/17/2013 1320   CREATININE 1.1 08/05/2013 0803   CREATININE 0.53 05/17/2013 1320      Component Value Date/Time   CALCIUM 10.1 08/05/2013 0803   CALCIUM 8.9 05/17/2013 1320   ALKPHOS 74 08/05/2013 0803   ALKPHOS 82 11/26/2012 0500   AST 104* 08/05/2013 0803   AST 13 11/26/2012 0500   ALT 45 08/05/2013 0803   ALT 9 11/26/2012 0500   BILITOT 0.30 08/05/2013 0803   BILITOT 0.3 11/26/2012 0500       RADIOGRAPHIC STUDIES: Ct Chest W Contrast  08/23/2013   *RADIOLOGY REPORT*  Clinical Data: Lung cancer, chemotherapy ongoing, RECIST protocol  CT CHEST WITH CONTRAST  Technique:  Multidetector CT imaging of the chest was performed following the standard protocol during bolus administration of intravenous contrast.  Contrast: 80mL OMNIPAQUE IOHEXOL 300 MG/ML  SOLN  Comparison: 06/22/2013  ---  RECIST 1.1  Target Lesions:  1. Right lower lobe lesion - difficult to discretely measure but approximately 19 x 11 mm (series 2/image 50), previously 15 x 10 mm  2. Pretracheal lymph node - measures 15 x 10 mm (series 2/image 26), previously 14 x 12 mm  Non-target Lesions:  1. Subcarinal lymph node - present, measures 11 mm short axis (series 2/image 32), previously 12 mm  2. Right hilar lymph node - less conspicuous but likely  present, measures 5 mm short axis (series 2/image 30), unchanged  3. Right anterior mediastinal lymph node - present, measures 6 mm short axis (series 2/image 21), unchanged  4. Right pleural effusion - present, small, mildly increased  ---  Findings: 4.5 x 2.7 cm thick-walled fluid collection in the medial right lung apex (series 2/image 11), decreased, likely infectious/inflammatory.  Associated gas within the lesion has resolved.  Surrounding spiculation/ground-glass opacity has significantly improved (series 5/image 18).  Small right pleural effusion, mildly increased.  Associated rounded atelectasis with ill-defined 1.9 x 1.1 cm enhancing pleural-based lesion medially (series 2/image 50), previously 1.5 x 1.0 cm.  Underlying moderate paraseptal emphysematous changes.  No pneumothorax.  The heart is normal in size.  No pericardial effusion.  Mediastinal lymphadenopathy, grossly unchanged, including: --6 mm short-axis anterior mediastinal node (series 2/image 21), unchanged --6 mm short-axis high right paratracheal node (series 2/image 22), previously 7 mm --10 mm short axis precarinal node (series 2/image 26), previously 12 mm --10 mm  short-axis AP window node (series 2/image 27), previously 12 mm --11 mm short-axis subcarinal node (series 2/image 32), previously 12 mm  Visualized upper abdomen is unremarkable.  Mild degenerative changes of the visualized thoracolumbar spine.  IMPRESSION: 19 x 11 mm enhancing pleural-based lesion at the right lung base with associated rounded atelectasis, possibly mild increased. Attention on follow-up suggested.  Mild mediastinal lymphadenopathy, grossly unchanged.  Improving 4.5 x 2.7 cm thick wall fluid collection in the medial right lung apex, likely infectious/inflammatory.  RECIST 1.1 measurements as above.   Original Report Authenticated By: Charline Bills, M.D.    ASSESSMENT AND PLAN: This is a very pleasant 52 years old white male with metastatic non-small cell  lung cancer, adenocarcinoma currently on maintenance treatment with Alimta 500 mg/M2 every 3 weeks. He is tolerating his treatment fairly well. The recent CT scan of the chest showed very mild increase in size of one of the pleural based right lung nodule with improvement in the thickwalled fluid collection in the medial right lung apex. I personally think that his scan is stable at this point. I recommended for the patient to continue his maintenance treatment with Alimta.  He will receive cycle #7 today. The patient would come back for followup visit in 3 weeks with the next cycle of his chemotherapy. He was advised to call immediately if he has any concerning symptoms in the interval. The patient voices understanding of current disease status and treatment options and is in agreement with the current care plan.  All questions were answered. The patient knows to call the clinic with any problems, questions or concerns. We can certainly see the patient much sooner if necessary.  I spent 15 minutes counseling the patient face to face. The total time spent in the appointment was 25 minutes.

## 2013-08-26 NOTE — Telephone Encounter (Signed)
gv and printed appt sched and avs for pt...emailed MW to add tx... °

## 2013-09-02 ENCOUNTER — Encounter: Payer: Self-pay | Admitting: *Deleted

## 2013-09-02 NOTE — Progress Notes (Signed)
09/02/13 at 10:12am- The research nurse contacted the study co-chair on Wednesday, 08/28/13, regarding this pt's RECIST table and his disease status.  Dr. Arbutus Ped felt that the pt's latest scans on 08/23/13 did not reveal definitive disease progression.  He felt that the pt's disease status was "stable".  The pt's RECIST target measurements were 34 mm and to meet progression it must be 33.6 mm.  Also, of note, the radiologist stated that one of the target lesions was "difficult to discretely measure".   The research nurse wanted clarification from the study co-chair if it was acceptable to mark this pt's disease status as "insufficient evaluation to determine response".  Dr. Bolivar Haw, the study co-chair, felt that if the radiologist measured the target lesions and the RECIST numbers show progression, then the pt's overall disease could not be marked as "insufficient evaluation to determine response".  Dr. Bolivar Haw encouraged the pt's MD to have the radiologist re-measure the lesions.  The research nurse forwarded Dr. Mont Dutton remarks and comments to Dr. Arbutus Ped.  Dr. Arbutus Ped and the nurse discussed the pt's disease status.  Dr. Arbutus Ped agreed to simply take the pt off-study due to "progression" based on the RECIST table.  He said that he is going to keep the pt on the same treatment off-study until in his opinion the pt has definitively progressed.  He stated that the pt's 08/26/13 treatment should be considered the pt's first off-study treatment.  The research nurse will complete the pt's CRF to reflect the pt is now off treatment.  The pharmacist, Merlene Laughter, was contacted and informed the pt is no longer on study treatment.  She was informed that per Dr. Arbutus Ped the pt's 08/26/13 treatment is the pt's first off-study treatment.  Marilu Favre, Teacher, English as a foreign language, was notified that the pt has been taken off-study treatment by Dr. Arbutus Ped.

## 2013-09-05 ENCOUNTER — Telehealth: Payer: Self-pay | Admitting: *Deleted

## 2013-09-05 ENCOUNTER — Telehealth: Payer: Self-pay | Admitting: Medical Oncology

## 2013-09-05 NOTE — Telephone Encounter (Signed)
09/05/13 at 1:08pm- The pt called and states that he has not been feeling as well as usual.  He states that he is "still out of work".  He states that he is trying to get disability, and he is concerned over his finances.  The pt said that he has had decreased appetite for the past 2 weeks.  He states that he "tries to eat", but he is unable to eat "much of anything". The pt said that he wanted Dr. Arbutus Ped to know about this change.  He also reports increased fatigue.  He said that he didn't know if Dr. Arbutus Ped wanted to see him sooner than his scheduled appointment on 09/16/13.  The research nurse explained to the patient that the study chair was notified regarding his last set of scans.  The study chair felt that the patient's RECIST measurements confirmed that the pt's tumor burden had increased/progressed slightly.  Dr. Arbutus Ped stated that he understood the RECIST measurements confirmed progression.  The pt was informed that Dr. Arbutus Ped was in agreement to remove the pt from active protocol treatment because of the RECIST measurements.  The pt was informed that Dr. Arbutus Ped still wanted the pt to remain on his current treatment until he has progressed  in his opinion.  The research nurse answered of his questions.  The pt was fine with being removed from active protocol treatment.  He is aware that he is follow-up for the  CTSU E5508 study.  The pt remains on the BMS [id number] observational study.  The research nurse forwarded the patient to Dr. Asa Lente nurse voicemail to see if the pt needs to be evaluated sooner.  The research nurse thanked the pt for calling to let us know his status.  The research nurse also contacted Dr. Asa Lente PA, Tiana Loft, and notified her that the pt called with some complaints.  The PA will notify the research nurse if the pt is seen before his scheduled 09/16/13 appointment.

## 2013-09-05 NOTE — Telephone Encounter (Signed)
I received a forwarded message from [place] in research . Pt left a message reporting he is having increased "discomfort, fatigue, and decreased appetite." I left a message for pt to call me back.

## 2013-09-06 NOTE — Telephone Encounter (Signed)
Discussed concerns with patient, he is worried that "we may be missing something" and that is why he does not feel well.  Pt denies being in any distress and informed him I would talk to Dr Donnald Garre when he returns on Monday 09/09/13.  He verbalized understanding.  SLJ

## 2013-09-09 ENCOUNTER — Telehealth: Payer: Self-pay | Admitting: *Deleted

## 2013-09-09 NOTE — Telephone Encounter (Signed)
Erroneous

## 2013-09-09 NOTE — Telephone Encounter (Signed)
Spoke to Dr Donnald Garre , he will discuss in more detail with Dr Donnald Garre when he comes back on 9/29, but Dr Donnald Garre states that he feels thatnothing is being overlooked in his care.  Pt states he will discuss further at f/u visit and is to call if anything changes or gets worse.  SLJ

## 2013-09-16 ENCOUNTER — Encounter: Payer: Self-pay | Admitting: Physician Assistant

## 2013-09-16 ENCOUNTER — Telehealth: Payer: Self-pay | Admitting: Internal Medicine

## 2013-09-16 ENCOUNTER — Telehealth: Payer: Self-pay | Admitting: *Deleted

## 2013-09-16 ENCOUNTER — Ambulatory Visit (HOSPITAL_BASED_OUTPATIENT_CLINIC_OR_DEPARTMENT_OTHER): Payer: Self-pay | Admitting: Physician Assistant

## 2013-09-16 ENCOUNTER — Encounter: Payer: Self-pay | Admitting: *Deleted

## 2013-09-16 ENCOUNTER — Ambulatory Visit (HOSPITAL_BASED_OUTPATIENT_CLINIC_OR_DEPARTMENT_OTHER): Payer: Self-pay | Admitting: Lab

## 2013-09-16 ENCOUNTER — Ambulatory Visit: Payer: Self-pay

## 2013-09-16 ENCOUNTER — Other Ambulatory Visit: Payer: Self-pay | Admitting: Lab

## 2013-09-16 ENCOUNTER — Other Ambulatory Visit: Payer: Self-pay | Admitting: Physician Assistant

## 2013-09-16 VITALS — BP 122/80 | HR 79 | Temp 98.2°F | Resp 18 | Ht 75.0 in | Wt 163.3 lb

## 2013-09-16 DIAGNOSIS — D709 Neutropenia, unspecified: Secondary | ICD-10-CM

## 2013-09-16 DIAGNOSIS — C3490 Malignant neoplasm of unspecified part of unspecified bronchus or lung: Secondary | ICD-10-CM

## 2013-09-16 DIAGNOSIS — D649 Anemia, unspecified: Secondary | ICD-10-CM

## 2013-09-16 DIAGNOSIS — C778 Secondary and unspecified malignant neoplasm of lymph nodes of multiple regions: Secondary | ICD-10-CM

## 2013-09-16 DIAGNOSIS — R5381 Other malaise: Secondary | ICD-10-CM

## 2013-09-16 DIAGNOSIS — C3430 Malignant neoplasm of lower lobe, unspecified bronchus or lung: Secondary | ICD-10-CM

## 2013-09-16 DIAGNOSIS — D701 Agranulocytosis secondary to cancer chemotherapy: Secondary | ICD-10-CM

## 2013-09-16 LAB — CBC WITH DIFFERENTIAL/PLATELET
Basophils Absolute: 0 10*3/uL (ref 0.0–0.1)
Eosinophils Absolute: 0 10*3/uL (ref 0.0–0.5)
HGB: 8.4 g/dL — ABNORMAL LOW (ref 13.0–17.1)
MONO#: 0.4 10*3/uL (ref 0.1–0.9)
NEUT#: 0.6 10*3/uL — ABNORMAL LOW (ref 1.5–6.5)
Platelets: 356 10*3/uL (ref 140–400)
RBC: 2.2 10*6/uL — ABNORMAL LOW (ref 4.20–5.82)
RDW: 16.7 % — ABNORMAL HIGH (ref 11.0–14.6)
WBC: 2.3 10*3/uL — ABNORMAL LOW (ref 4.0–10.3)
nRBC: 0 % (ref 0–0)

## 2013-09-16 LAB — LACTATE DEHYDROGENASE (CC13): LDH: 454 U/L — ABNORMAL HIGH (ref 125–245)

## 2013-09-16 LAB — COMPREHENSIVE METABOLIC PANEL (CC13)
ALT: 27 U/L (ref 0–55)
Albumin: 4.1 g/dL (ref 3.5–5.0)
CO2: 31 mEq/L — ABNORMAL HIGH (ref 22–29)
Calcium: 9.2 mg/dL (ref 8.4–10.4)
Chloride: 94 mEq/L — ABNORMAL LOW (ref 98–109)
Glucose: 87 mg/dl (ref 70–140)
Sodium: 138 mEq/L (ref 136–145)
Total Protein: 7.8 g/dL (ref 6.4–8.3)

## 2013-09-16 NOTE — Progress Notes (Addendum)
[facility] [phone]   [phone]  SHARED VISIT PROGRESS NOTE  Jay Pitter, MD [phone] [address] [place] [address]  DIAGNOSIS: Metastatic non-small cell lung cancer, adenocarcinoma with negative EGFR mutation and negative ALK gene translocation diagnosed in December of 2013.   PRIOR THERAPY:  1. Status post Pleurx catheter placement for drainage of right-sided pleural effusion under the care of Dr. Laneta Simmers on 11/29/2012.  2. Systemic chemotherapy with carboplatin for AUC of 6, paclitaxel 200 mg/M2 and Avastin 15 mg/kg every 3 weeks according to the ECoG protocol 5508. Status post 4 cycles.  3. Maintenance chemotherapy according to the ECoG 5508 protocol, patient has been randomized to pemetrexed 500 mg per meter squared given every 3 weeks. Status post 6 cycles.   CURRENT THERAPY:  Maintenance chemotherapy with single agent Alimta at 500 mg per meter squared given every 3 weeks status post 1 cycle  CHEMOTHERAPY INTENT: Palliative  CURRENT # OF CHEMOTHERAPY CYCLES: 7 CURRENT ANTIEMETICS: Zofran, dexamethasone and Compazine  CURRENT SMOKING STATUS: Former smoker quit 11/25/2012  ORAL CHEMOTHERAPY AND CONSENT: None  CURRENT BISPHOSPHONATES USE: None  PAIN MANAGEMENT: 0/10  NARCOTICS INDUCED CONSTIPATION: None  LIVING WILL AND CODE STATUS: No CODE BLUE   INTERVAL HISTORY: Jay Watts 52 y.o. male returns to the clinic today for followup visit. He has several complaints. He complains of fatigue and decreased appetite. He has been forcing himself to eat and has been eating primarily yogurt and soup. He reports that for the past 3-4 weeks his stools have been dark and hard and also decreased frequency. He reports that he might have one regular bowel movement per week. He had a colonoscopy performed by Dr. Jeani Watts on May 02 2013. He had a 3 mm polyp removed and was also noted to have both internal and external hemorrhoids. He denies  any further episodes of epistaxis but notes that approximately one and a half weeks ago he had some soreness of his tongue as well as on the roof of his mouth. It made it difficult to eat. He noted a "blood clot" in his throat and the taste of blood in his mouth at that time. He's had no further similar episodes. He complains of feeling bloated and has some mid to lower abdominal "discomfort".  He is currently being treated with single agent Alimta, off study. His recent restaging CT scan revealed his index lesion to be at 34 mm, with the cut off for the study being 33.6 mm indicating evidence for progression per ECOG protocol # 5508.  He tolerated his last cycle of the chemotherapy with single agent Alimta fairly well. The patient denied having any significant nausea or vomiting. He has no fever or chills. He denied having any significant chest pain, shortness breath, cough or hemoptysis.   MEDICAL HISTORY: Past Medical History  Diagnosis Date  . Bipolar 2 disorder   . Shortness of breath     with exertion  . Pneumothorax     right side- spring 2013  . Lung cancer   . Adenocarcinoma, lung 11/30/2012    ALLERGIES:  has No Known Allergies.  MEDICATIONS:  Current Outpatient Prescriptions  Medication Sig Dispense Refill  . lamoTRIgine (LAMICTAL) 150 MG tablet Take 150 mg by mouth at bedtime.      . mirtazapine (REMERON) 45 MG tablet Take 45 mg by mouth at bedtime.      . Multiple Vitamin (MULTIVITAMIN WITH MINERALS) TABS Take  1 tablet by mouth daily.      Marland Kitchen PRESCRIPTION MEDICATION Inject into the vein every 21 ( twenty-one) days. Alimta infusion q3weeks.       No current facility-administered medications for this visit.    SURGICAL HISTORY:  Past Surgical History  Procedure Laterality Date  . Turbinate reduction    . Chest tube insertion  11/29/2012    Procedure: INSERTION PLEURAL DRAINAGE CATHETER;  Surgeon: Jay Borne, MD;  Location: [facility] OR;  Service: Thoracic;  Laterality: Right;   . Removal of pleural drainage catheter Right 02/14/2013    Procedure: REMOVAL OF PLEURAL DRAINAGE CATHETER;  Surgeon: Jay Borne, MD;  Location: [facility] OR;  Service: Thoracic;  Laterality: Right;    REVIEW OF SYSTEMS:  A comprehensive review of systems was negative except for: Constitutional: positive for anorexia, fatigue and malaise Gastrointestinal: positive for change in bowel habits, constipation and Bloating and abdominal discomfort   PHYSICAL EXAMINATION: General appearance: alert, cooperative, fatigued and no distress Head: Normocephalic, without obvious abnormality, atraumatic Neck: no adenopathy Lymph nodes: Cervical, supraclavicular, and axillary nodes normal. Resp: clear to auscultation bilaterally Cardio: regular rate and rhythm, S1, S2 normal, no murmur, click, rub or gallop GI: Abdomen is soft however there is some mild right mid upper quadrant tenderness with a slight questionable hepatomegaly. Bowel sounds are present normally active in all quadrants. Rectal exam reveals external hemorrhoids, tiny amount of light brown stool in the rectal vault is negative for occult blood Extremities: extremities normal, atraumatic, no cyanosis or edema Neurologic: Alert and oriented X 3, normal strength and tone. Normal symmetric reflexes. Normal coordination and gait  ECOG PERFORMANCE STATUS: 1 - Symptomatic but completely ambulatory  Blood pressure 122/80, pulse 79, temperature 98.2 F (36.8 C), temperature source Oral, resp. rate 18, height 6\' 3"  (1.905 m), weight 163 lb 4.8 oz (74.072 kg).  LABORATORY DATA: Lab Results  Component Value Date   WBC 2.3* 09/16/2013   HGB 8.4* 09/16/2013   HCT 23.1* 09/16/2013   MCV 105.0* 09/16/2013   PLT 356 09/16/2013      Chemistry      Component Value Date/Time   NA 138 09/16/2013 0917   NA 135 05/17/2013 1320   K 3.3* 09/16/2013 0917   K 3.9 05/17/2013 1320   CL 98 06/03/2013 0914   CL 97 05/17/2013 1320   CO2 31* 09/16/2013 0917   CO2 27  05/17/2013 1320   BUN 7.1 09/16/2013 0917   BUN 11 05/17/2013 1320   CREATININE 1.4* 09/16/2013 0917   CREATININE 0.53 05/17/2013 1320      Component Value Date/Time   CALCIUM 9.2 09/16/2013 0917   CALCIUM 8.9 05/17/2013 1320   ALKPHOS 103 09/16/2013 0917   ALKPHOS 82 11/26/2012 0500   AST 71* 09/16/2013 0917   AST 13 11/26/2012 0500   ALT 27 09/16/2013 0917   ALT 9 11/26/2012 0500   BILITOT 0.41 09/16/2013 0917   BILITOT 0.3 11/26/2012 0500       RADIOGRAPHIC STUDIES: Ct Chest W Contrast  08/23/2013   *RADIOLOGY REPORT*  Clinical Data: Lung cancer, chemotherapy ongoing, RECIST protocol  CT CHEST WITH CONTRAST  Technique:  Multidetector CT imaging of the chest was performed following the standard protocol during bolus administration of intravenous contrast.  Contrast: 80mL OMNIPAQUE IOHEXOL 300 MG/ML  SOLN  Comparison: 06/22/2013  ---  RECIST 1.1  Target Lesions:  1. Right lower lobe lesion - difficult to discretely measure but approximately 19 x 11 mm (series 2/image  50), previously 15 x 10 mm  2. Pretracheal lymph node - measures 15 x 10 mm (series 2/image 26), previously 14 x 12 mm  Non-target Lesions:  1. Subcarinal lymph node - present, measures 11 mm short axis (series 2/image 32), previously 12 mm  2. Right hilar lymph node - less conspicuous but likely present, measures 5 mm short axis (series 2/image 30), unchanged  3. Right anterior mediastinal lymph node - present, measures 6 mm short axis (series 2/image 21), unchanged  4. Right pleural effusion - present, small, mildly increased  ---  Findings: 4.5 x 2.7 cm thick-walled fluid collection in the medial right lung apex (series 2/image 11), decreased, likely infectious/inflammatory.  Associated gas within the lesion has resolved.  Surrounding spiculation/ground-glass opacity has significantly improved (series 5/image 18).  Small right pleural effusion, mildly increased.  Associated rounded atelectasis with ill-defined 1.9 x 1.1 cm enhancing  pleural-based lesion medially (series 2/image 50), previously 1.5 x 1.0 cm.  Underlying moderate paraseptal emphysematous changes.  No pneumothorax.  The heart is normal in size.  No pericardial effusion.  Mediastinal lymphadenopathy, grossly unchanged, including: --6 mm short-axis anterior mediastinal node (series 2/image 21), unchanged --6 mm short-axis high right paratracheal node (series 2/image 22), previously 7 mm --10 mm short axis precarinal node (series 2/image 26), previously 12 mm --10 mm short-axis AP window node (series 2/image 27), previously 12 mm --11 mm short-axis subcarinal node (series 2/image 32), previously 12 mm  Visualized upper abdomen is unremarkable.  Mild degenerative changes of the visualized thoracolumbar spine.  IMPRESSION: 19 x 11 mm enhancing pleural-based lesion at the right lung base with associated rounded atelectasis, possibly mild increased. Attention on follow-up suggested.  Mild mediastinal lymphadenopathy, grossly unchanged.  Improving 4.5 x 2.7 cm thick wall fluid collection in the medial right lung apex, likely infectious/inflammatory.  RECIST 1.1 measurements as above.   Original Report Authenticated By: Jay Watts, M.D.    ASSESSMENT AND PLAN: This is a very pleasant 52 years old white male with metastatic non-small cell lung cancer, adenocarcinoma currently on maintenance treatment with Alimta 500 mg/M2 every 3 weeks. He is tolerating his treatment fairly well. The recent CT scan of the chest showed very mild increase in size of one of the pleural based right lung nodule with improvement in the thickwalled fluid collection in the medial right lung apex. Per Dr. Arbutus Ped the patient's CT scan is relatively stable, however per the parameters of the study his scan represents disease progression and he is therefore off protocol. He will however continue with the same treatment with single agent Alimta in general. Today however his ANC is 0.6 with a hemoglobin of 8.4  which is somewhat perplexing as is the etiology of these parameters is unclear. We will obtain iron studies and await the results of his chemistries from today for further evaluation. We'll postpone would be cycle #8 which is cycle #2 off protocol of his single agent Alimta by 1 week and have him return in one week with repeat CBC differential and C. met. He is encouraged to increase his by mouth intake of both of food and fluids. He may need re-evaluation by his gastroenterologist should his anemia persist. Patient was discussed with also seen by Dr. Arbutus Ped.  Jay Watts, Jay Baggerly E, PA-C   He was advised to call immediately if he has any concerning symptoms in the interval. The patient voices understanding of current disease status and treatment options and is in agreement with the current care plan.  All questions were answered. The patient knows to call the clinic with any problems, questions or concerns. We can certainly see the patient much sooner if necessary.  ADDENDUM: Hematology/Oncology Attending: I have a face to face encounter with the patient today. I recommended his care plan. The patient is 52 years old white male with metastatic non-small cell lung cancer, adenocarcinoma status post induction chemotherapy was carboplatin, paclitaxel and Avastin followed by maintenance chemotherapy with single agent Alimta for 6 cycles on the ECoG protocol 5508. He is currently on the seventh cycle of chemotherapy with maintenance Alimta but off protocol secondary to evidence for disease progression by the protocol criteria. His last scan showed no significant evidence for disease progression compared to the previous and I recommended for the patient to continue on maintenance Alimta with the same dose. He presented today with increasing fatigue and weakness as well as neutropenia and anemia. This could be secondary to his recent chemotherapy with single agent Alimta. I will delay the start of cycle #8 until  next week to give the patient more time for recovery. He had a recent GI evaluation with colonoscopy under the care of Dr. Jeani Watts that was unremarkable except for a small polyp that was removed. I would also order iron study today to evaluate his anemia. The patient would come back for follow up visit in one week for evaluation. Lajuana Matte., MD 09/16/2013

## 2013-09-16 NOTE — Telephone Encounter (Signed)
Gave pt appt for lab, chemo and MD 2014 for October 2014

## 2013-09-16 NOTE — Progress Notes (Signed)
09/16/13 at 11:47am - Post- enrollment visit- (treatment held due to pt's neutropenia).  The pt was into the cancer center this am for his assessments and treatment.  The pt was met upon arrival to the cancer center by the research nurse.  The pt was given his questionnaires to complete before his study procedures.  The pt immediately completed his questionnaires (PRO's) before his lab appt.  The research nurse reviewed the pt's PRO's for completeness and accuracy.  The pt was seen and examined by Tiana Loft, PA and Dr. Arbutus Ped.  The pt's cbc/diff revealed a low ANC value of 0.6.  The pt's hemoglobin has also dropped down to 8.4.  Therefore, the pt's treatment was held today to allow his blood counts to recover.  The pt will be evaluated next week to see if he is ready to resume his treatment.  The pt's completed PRO's from today are "good" for 2 weeks.

## 2013-09-16 NOTE — Progress Notes (Signed)
09/16/13 at 12:05pm - [id number] - 30 days after last on-study treatment AE follow up - The pt was into the cancer center this morning for his assessments  (lab and physical exam) and to receive his 2nd off-study treatment.  The pt's last on-study treatment was given on 08/05/13 (30 days after this treatment is 09/04/13).  The pt was not evaluated for AE's until today.  The pt was seen and examined today by both Tiana Loft, PA and Dr. Arbutus Ped.  The pt reports that he has not felt "well" for the past month. He states that he did not report this "feeling" at his 08/26/13 visit because he was hoping that he would start to feel better.  He states that he has "moderate" fatigue -grade 2.  He reports a decreased appetite, but his weight has remained stable.  He states that he fixes himself food and then he "just doesn't want it".  Anorexia -grade 2.  He states that he has some mild taste alteration- grade 1 dysgeisia.  His cbc/diff revealed a low ANC value of 0.6 (grade 3 neutrophils)  and anemia- grade 2 (hemoglobin drop from 12.4 to 8.4).  The pt's 2nd off-study treatment was held today to allow his counts to recover.  The pt states that he has also been constipated, bloated, with a mild abdominal discomfort (all grade 1 toxicities)  for the past month.  He states that his stools "may be alittle darker".  The pt's quaic was negative.  The pt will be seen in 1 week for continued follow-up.    09/23/13 at 5:27pm - The pt was into the cancer center this afternoon for follow-up.  The pt's ANC value has improved, and Dr. Arbutus Ped wants to proceed with the pt's  2nd off-treatment chemotherapy today.  Dr. Arbutus Ped provided attributions for the reportable AE's that occurred through 09/16/13.  The pt will be transfused RBC's this week due to his hemoglobin drop.  The pt is working again.  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  He said that he slept a lot on Sunday.

## 2013-09-16 NOTE — Patient Instructions (Addendum)
Your neutrophil count ( infection fighters) are to low to proceed with chemotherapy today. It will be rescheduled to next week when we will recheck your blood counts Follow up in 1 week

## 2013-09-16 NOTE — Telephone Encounter (Signed)
Per staff message and POF I have scheduled appts.  JMW  

## 2013-09-23 ENCOUNTER — Other Ambulatory Visit (HOSPITAL_BASED_OUTPATIENT_CLINIC_OR_DEPARTMENT_OTHER): Payer: Medicaid Other | Admitting: Lab

## 2013-09-23 ENCOUNTER — Other Ambulatory Visit: Payer: Self-pay | Admitting: Lab

## 2013-09-23 ENCOUNTER — Encounter: Payer: Self-pay | Admitting: Physician Assistant

## 2013-09-23 ENCOUNTER — Encounter: Payer: Self-pay | Admitting: *Deleted

## 2013-09-23 ENCOUNTER — Ambulatory Visit (HOSPITAL_COMMUNITY)
Admission: RE | Admit: 2013-09-23 | Discharge: 2013-09-23 | Disposition: A | Discharge status: Home / Self Care | Payer: Medicaid Other | Source: Ambulatory Visit | Attending: Internal Medicine | Admitting: Internal Medicine

## 2013-09-23 ENCOUNTER — Ambulatory Visit (HOSPITAL_BASED_OUTPATIENT_CLINIC_OR_DEPARTMENT_OTHER): Payer: Medicaid Other | Admitting: Physician Assistant

## 2013-09-23 ENCOUNTER — Ambulatory Visit (HOSPITAL_BASED_OUTPATIENT_CLINIC_OR_DEPARTMENT_OTHER): Payer: Medicaid Other

## 2013-09-23 VITALS — BP 126/84 | HR 96 | Temp 98.6°F | Resp 19 | Ht 74.0 in | Wt 165.8 lb

## 2013-09-23 VITALS — BP 123/62 | HR 86 | Temp 97.1°F | Resp 18

## 2013-09-23 DIAGNOSIS — D649 Anemia, unspecified: Secondary | ICD-10-CM

## 2013-09-23 DIAGNOSIS — C3430 Malignant neoplasm of lower lobe, unspecified bronchus or lung: Secondary | ICD-10-CM

## 2013-09-23 DIAGNOSIS — C3490 Malignant neoplasm of unspecified part of unspecified bronchus or lung: Secondary | ICD-10-CM

## 2013-09-23 DIAGNOSIS — C778 Secondary and unspecified malignant neoplasm of lymph nodes of multiple regions: Secondary | ICD-10-CM

## 2013-09-23 DIAGNOSIS — Z5111 Encounter for antineoplastic chemotherapy: Secondary | ICD-10-CM

## 2013-09-23 LAB — COMPREHENSIVE METABOLIC PANEL (CC13)
ALT: 27 U/L (ref 0–55)
AST: 116 U/L — ABNORMAL HIGH (ref 5–34)
Albumin: 3.8 g/dL (ref 3.5–5.0)
Alkaline Phosphatase: 88 U/L (ref 40–150)
BUN: 9.9 mg/dL (ref 7.0–26.0)
Calcium: 9.1 mg/dL (ref 8.4–10.4)
Chloride: 96 mEq/L — ABNORMAL LOW (ref 98–109)
Creatinine: 1.6 mg/dL — ABNORMAL HIGH (ref 0.7–1.3)
Potassium: 3.1 mEq/L — ABNORMAL LOW (ref 3.5–5.1)

## 2013-09-23 LAB — CBC WITH DIFFERENTIAL/PLATELET
BASO%: 0.2 % (ref 0.0–2.0)
EOS%: 0.4 % (ref 0.0–7.0)
MCH: 38.3 pg — ABNORMAL HIGH (ref 27.2–33.4)
MCHC: 36.4 g/dL — ABNORMAL HIGH (ref 32.0–36.0)
MONO#: 0.6 10*3/uL (ref 0.1–0.9)
NEUT#: 3.3 10*3/uL (ref 1.5–6.5)
RBC: 1.96 10*6/uL — ABNORMAL LOW (ref 4.20–5.82)
lymph#: 1.6 10*3/uL (ref 0.9–3.3)
nRBC: 0 % (ref 0–0)

## 2013-09-23 LAB — ABO/RH: ABO/RH(D): O POS

## 2013-09-23 MED ORDER — CYANOCOBALAMIN 1000 MCG/ML IJ SOLN
1000.0000 ug | Freq: Once | INTRAMUSCULAR | Status: AC
  Administered 2013-09-23: 1000 ug via INTRAMUSCULAR

## 2013-09-23 MED ORDER — ACETAMINOPHEN 325 MG PO TABS
ORAL_TABLET | ORAL | Status: AC
  Filled 2013-09-23: qty 2

## 2013-09-23 MED ORDER — DIPHENHYDRAMINE HCL 25 MG PO CAPS
ORAL_CAPSULE | ORAL | Status: AC
  Filled 2013-09-23: qty 1

## 2013-09-23 MED ORDER — SODIUM CHLORIDE 0.9 % IV SOLN
Freq: Once | INTRAVENOUS | Status: AC
  Administered 2013-09-23: 16:00:00 via INTRAVENOUS

## 2013-09-23 MED ORDER — SODIUM CHLORIDE 0.9 % IV SOLN
500.0000 mg/m2 | Freq: Once | INTRAVENOUS | Status: AC
  Administered 2013-09-23: 975 mg via INTRAVENOUS
  Filled 2013-09-23: qty 39

## 2013-09-23 MED ORDER — ACETAMINOPHEN 325 MG PO TABS
650.0000 mg | ORAL_TABLET | Freq: Once | ORAL | Status: AC
  Administered 2013-09-23: 650 mg via ORAL

## 2013-09-23 MED ORDER — ONDANSETRON 8 MG/NS 50 ML IVPB
INTRAVENOUS | Status: AC
  Filled 2013-09-23: qty 8

## 2013-09-23 MED ORDER — CYANOCOBALAMIN 1000 MCG/ML IJ SOLN
INTRAMUSCULAR | Status: AC
  Filled 2013-09-23: qty 1

## 2013-09-23 MED ORDER — DIPHENHYDRAMINE HCL 25 MG PO CAPS
25.0000 mg | ORAL_CAPSULE | Freq: Once | ORAL | Status: AC
  Administered 2013-09-23: 25 mg via ORAL

## 2013-09-23 MED ORDER — SODIUM CHLORIDE 0.9 % IV SOLN
250.0000 mL | Freq: Once | INTRAVENOUS | Status: AC
  Administered 2013-09-23: 250 mL via INTRAVENOUS

## 2013-09-23 MED ORDER — ONDANSETRON 8 MG/50ML IVPB (CHCC)
8.0000 mg | Freq: Once | INTRAVENOUS | Status: AC
  Administered 2013-09-23: 8 mg via INTRAVENOUS

## 2013-09-23 NOTE — Patient Instructions (Addendum)
[facility] Discharge Instructions for Patients Receiving Chemotherapy  Today you received the following chemotherapy agents: alimta  To help prevent nausea and vomiting after your treatment, we encourage you to take your nausea medication.  Take it as often as prescribed.     If you develop nausea and vomiting that is not controlled by your nausea medication, call the clinic. If it is after clinic hours your family physician or the after hours number for the clinic or go to the Emergency Department.   BELOW ARE SYMPTOMS THAT SHOULD BE REPORTED IMMEDIATELY:  *FEVER GREATER THAN 100.5 F  *CHILLS WITH OR WITHOUT FEVER  NAUSEA AND VOMITING THAT IS NOT CONTROLLED WITH YOUR NAUSEA MEDICATION  *UNUSUAL SHORTNESS OF BREATH  *UNUSUAL BRUISING OR BLEEDING  TENDERNESS IN MOUTH AND THROAT WITH OR WITHOUT PRESENCE OF ULCERS  *URINARY PROBLEMS  *BOWEL PROBLEMS  UNUSUAL RASH Items with * indicate a potential emergency and should be followed up as soon as possible.  Feel free to call the clinic you have any questions or concerns. The clinic phone number is [phone].   I have been informed and understand all the instructions given to me. I know to contact the clinic, my physician, or go to the Emergency Department if any problems should occur. I do not have any questions at this time, but understand that I may call the clinic during office hours   should I have any questions or need assistance in obtaining follow up care.    __________________________________________  _____________  __________ Signature of Patient or Authorized Representative            Date                   Time    __________________________________________ Nurse's Signature   Platelet Transfusion Information This is information about transfusions of platelets. Platelets are tiny cells made by the bone marrow and found in the blood. When a blood vessel is damaged platelets rush to the damaged  area to help form a clot. This begins the healing process. When platelets get very low your blood may have trouble clotting. This may be from:  Illness.  Blood disorder.  Chemotherapy to treat cancer. Often lower platelet counts do not usually cause problems.  Platelets usually last for 7 to 10 days. If they are not used not used in an injury, they are broken down by the liver or spleen. Symptoms of low platelet count include:  Nosebleeds.  Bleeding gums.  Heavy periods.  Bruising and tiny blood spots in the skin.  Pin point spots of bleeding are called (petechiae).  Larger bruises (purpura).  Bleeding can be more serious if it happens in the brain or bowel. Platelet transfusions are often used to keep the platelet count at an acceptable level. Serious bleeding due to low platelets is uncommon. RISKS AND COMPLICATIONS Severe side effects from platelet transfusions are uncommon. Minor reactions may include:  Itching.  Rashes.  High temperature and shivering. Medications are available to stop transfusion reactions. Let your caregivers know if you develop any of the above problems.  If you are having platelet transfusions frequently they may get less effective. This is called becoming refractory to platelets. It is uncommon. This can happen from non-immune causes and immune causes. Non-immune causes include:  High temperatures.  Some medications.  An enlarged spleen. Immune causes happen when your body discovers the platelets are not your own and begin making antibodies against them. The antibodies  kill the platelets quickly. Even with platelet transfusions you may still notice problems with bleeding or bruising. Let your caregivers know about this. Other things can be done to help if this happens.  BEFORE THE PROCEDURE   Your doctors will check your platelet count regularly.  If the platelet count is too low it may be necessary to have a platelet transfusion.  This is  more important before certain procedures with a risk of bleeding such as a spinal tap.  Platelet transfusion reduces the risk of bleeding during or after the procedure.  Except in emergencies, giving a transfusion requires a written consent. Before blood is taken from a donor, a complete history is taken to make sure the person has no history of previous diseases, nor engages in risky social behavior. Examples of this are intravenous drug use or sexual activity with multiple partners. This could lead to infected blood or blood products being used. This history is done even in spite of the extensive testing to make sure the blood is safe. All blood products transfused are tested to make sure it is a match for the person getting the blood. It is also checked for infections. Blood is the safest it has ever been. The risk of getting an infection is very low. PROCEDURE  The platelets are stored in small plastic bags which are kept at a low temperature.  Each bag is called a unit and sometimes two units are given. They are given through an intravenous line by drip infusion over about one half hour.  Usually blood is collected from multiple people to get enough to transfuse.  Sometimes, the platelets are collected from a single person. This is done using a special machine that separates the platelets from the blood. The machine is called an apheresis machine. Platelets collected in this way are called apheresed platelets. Apheresed platelets reduce the risk of becoming sensitive to the platelets. This lowers the chances of having a transfusion reaction.  As it only takes a short time to give the platelets, this treatment can be given in an outpatients department. Platelets can also be given before or after other treatments. SEEK IMMEDIATE MEDICAL CARE IF: Any of the following symptoms over the next 12 hours or several days:  Shaking chills.  Fever with a temperature greater than 102 F (38.9 C)  develops.  Back pain or muscle pain.  People around you feel you are not acting correctly, or you are confused.  Blood in the urine or bowel movements or bleeding from any place in your body.  Shortness of breath, or difficulty breathing.  Dizziness.  Fainting.  You break out in a rash or develop hives.  You have a decrease in the amount of urine you are putting out, or the urine turns a dark color or changes to pink, red, or brown.  A severe headache or stiff neck.  Bruising more easily. Document Released: 10/02/2007 Document Revised: 02/27/2012 Document Reviewed: 10/02/2007 [facility] Patient Information 2014 [place], [place].

## 2013-09-23 NOTE — Progress Notes (Addendum)
[facility] [phone]   [phone]  SHARED VISIT PROGRESS NOTE  Geraldo Pitter, MD [phone] [address] [place] [address]  DIAGNOSIS: Metastatic non-small cell lung cancer, adenocarcinoma with negative EGFR mutation and negative ALK gene translocation diagnosed in December of 2013.   PRIOR THERAPY:  1. Status post Pleurx catheter placement for drainage of right-sided pleural effusion under the care of Dr. Laneta Simmers on 11/29/2012.  2. Systemic chemotherapy with carboplatin for AUC of 6, paclitaxel 200 mg/M2 and Avastin 15 mg/kg every 3 weeks according to the ECoG protocol 5508. Status post 4 cycles.  3. Maintenance chemotherapy according to the ECoG 5508 protocol, patient has been randomized to pemetrexed 500 mg per meter squared given every 3 weeks. Status post 6 cycles.   CURRENT THERAPY:  Maintenance chemotherapy with single agent Alimta at 500 mg per meter squared given every 3 weeks status post 1 cycle  CHEMOTHERAPY INTENT: Palliative  CURRENT # OF CHEMOTHERAPY CYCLES: 7 CURRENT ANTIEMETICS: Zofran, dexamethasone and Compazine  CURRENT SMOKING STATUS: Former smoker quit 11/25/2012  ORAL CHEMOTHERAPY AND CONSENT: None  CURRENT BISPHOSPHONATES USE: None  PAIN MANAGEMENT: 0/10  NARCOTICS INDUCED CONSTIPATION: None  LIVING WILL AND CODE STATUS: No CODE BLUE   INTERVAL HISTORY: Jay Watts 52 y.o. male returns to the clinic today for followup visit. His chemotherapy was postponed due to this week secondary to neutropenia. He was also noted at that time that his hemoglobin had dropped 4 g. He continues to deny any overt bleeding. He states that he intermittently has had some dark stools that were bulb shaped. He feels that his regularity of bowel movements in volume of bowel movements has decreased but his oral consumption has also decreased. Decreased oral consumption is due to several factors including decreased appetite as well as  financial issues. Other than an episode where he felt he had a the lesion or blood clot in his throat that caused him to taste blood in his mouth for approximately a week he denies any other issues of bleeding. He had a colonoscopy performed by Dr. Jeani Hawking on May 02 2013. He had a 3 mm polyp removed and was also noted to have both internal and external hemorrhoids.  He continues to complain of feeling bloated. He is currently being treated with single agent Alimta, off study. His recent restaging CT scan revealed his index lesion to be at 34 mm, with the cut off for the study being 33.6 mm indicating evidence for progression per ECOG protocol # 5508.  He tolerated his last cycle of the chemotherapy with single agent Alimta fairly well. The patient denied having any significant nausea or vomiting. He has no fever or chills. He denied having any significant chest pain, shortness breath, cough or hemoptysis.   MEDICAL HISTORY: Past Medical History  Diagnosis Date  . Bipolar 2 disorder   . Shortness of breath     with exertion  . Pneumothorax     right side- spring 2013  . Lung cancer   . Adenocarcinoma, lung 11/30/2012    ALLERGIES:  has No Known Allergies.  MEDICATIONS:  Current Outpatient Prescriptions  Medication Sig Dispense Refill  . lamoTRIgine (LAMICTAL) 150 MG tablet Take 150 mg by mouth at bedtime.      . mirtazapine (REMERON) 45 MG tablet Take 45 mg by mouth at bedtime.      . Multiple Vitamin (MULTIVITAMIN WITH MINERALS) TABS Take 1 tablet by mouth daily.      Marland Kitchen  PRESCRIPTION MEDICATION Inject into the vein every 21 ( twenty-one) days. Alimta infusion q3weeks.       No current facility-administered medications for this visit.   Facility-Administered Medications Ordered in Other Visits  Medication Dose Route Frequency Provider Last Rate Last Dose  . 0.9 %  sodium chloride infusion  250 mL Intravenous Once Tavionna Grout E Ginnifer Creelman, PA-C      . cyanocobalamin ((VITAMIN B-12)) injection  1,000 mcg  1,000 mcg Intramuscular Once Si Gaul, MD        SURGICAL HISTORY:  Past Surgical History  Procedure Laterality Date  . Turbinate reduction    . Chest tube insertion  11/29/2012    Procedure: INSERTION PLEURAL DRAINAGE CATHETER;  Surgeon: [name], MD;  Location: [facility] OR;  Service: Thoracic;  Laterality: Right;  . Removal of pleural drainage catheter Right 02/14/2013    Procedure: REMOVAL OF PLEURAL DRAINAGE CATHETER;  Surgeon: [name], MD;  Location: [facility] OR;  Service: Thoracic;  Laterality: Right;    REVIEW OF SYSTEMS:  A comprehensive review of systems was negative except for: Constitutional: positive for anorexia, fatigue and malaise Gastrointestinal: positive for change in bowel habits, constipation and Bloating    PHYSICAL EXAMINATION: General appearance: alert, cooperative, fatigued and no distress Head: Normocephalic, without obvious abnormality, atraumatic Neck: no adenopathy Lymph nodes: Cervical, supraclavicular, and axillary nodes normal. Resp: clear to auscultation bilaterally Cardio: regular rate and rhythm, S1, S2 normal, no murmur, click, rub or gallop GI: Abdomen is soft non-tender. Bowel sounds are present normally active in all quadrants. Rectal exam reveals external hemorrhoids, tiny amount of light brown stool in the rectal vault is negative for occult blood Extremities: extremities normal, atraumatic, no cyanosis or edema Neurologic: Alert and oriented X 3, normal strength and tone. Normal symmetric reflexes. Normal coordination and gait  ECOG PERFORMANCE STATUS: 1 - Symptomatic but completely ambulatory  Blood pressure 126/84, pulse 96, temperature 98.6 F (37 C), temperature source Oral, resp. rate 19, height 6\' 2"  (1.88 m), weight 165 lb 12.8 oz (75.206 kg), SpO2 95.00%.  LABORATORY DATA: Lab Results  Component Value Date   WBC 5.5 09/23/2013   HGB 7.5* 09/23/2013   HCT 20.6* 09/23/2013   MCV 105.1* 09/23/2013   PLT 399 09/23/2013       Chemistry      Component Value Date/Time   NA 136 09/23/2013 1421   NA 135 05/17/2013 1320   K 3.1* 09/23/2013 1421   K 3.9 05/17/2013 1320   CL 98 06/03/2013 0914   CL 97 05/17/2013 1320   CO2 27 09/23/2013 1421   CO2 27 05/17/2013 1320   BUN 9.9 09/23/2013 1421   BUN 11 05/17/2013 1320   CREATININE 1.6* 09/23/2013 1421   CREATININE 0.53 05/17/2013 1320      Component Value Date/Time   CALCIUM 9.1 09/23/2013 1421   CALCIUM 8.9 05/17/2013 1320   ALKPHOS 88 09/23/2013 1421   ALKPHOS 82 11/26/2012 0500   AST 116* 09/23/2013 1421   AST 13 11/26/2012 0500   ALT 27 09/23/2013 1421   ALT 9 11/26/2012 0500   BILITOT 0.51 09/23/2013 1421   BILITOT 0.3 11/26/2012 0500       RADIOGRAPHIC STUDIES: Ct Chest W Contrast  08/23/2013   *RADIOLOGY REPORT*  Clinical Data: Lung cancer, chemotherapy ongoing, RECIST protocol  CT CHEST WITH CONTRAST  Technique:  Multidetector CT imaging of the chest was performed following the standard protocol during bolus administration of intravenous contrast.  Contrast: 80mL OMNIPAQUE IOHEXOL  300 MG/ML  SOLN  Comparison: 06/22/2013  ---  RECIST 1.1  Target Lesions:  1. Right lower lobe lesion - difficult to discretely measure but approximately 19 x 11 mm (series 2/image 50), previously 15 x 10 mm  2. Pretracheal lymph node - measures 15 x 10 mm (series 2/image 26), previously 14 x 12 mm  Non-target Lesions:  1. Subcarinal lymph node - present, measures 11 mm short axis (series 2/image 32), previously 12 mm  2. Right hilar lymph node - less conspicuous but likely present, measures 5 mm short axis (series 2/image 30), unchanged  3. Right anterior mediastinal lymph node - present, measures 6 mm short axis (series 2/image 21), unchanged  4. Right pleural effusion - present, small, mildly increased  ---  Findings: 4.5 x 2.7 cm thick-walled fluid collection in the medial right lung apex (series 2/image 11), decreased, likely infectious/inflammatory.  Associated gas within the lesion has  resolved.  Surrounding spiculation/ground-glass opacity has significantly improved (series 5/image 18).  Small right pleural effusion, mildly increased.  Associated rounded atelectasis with ill-defined 1.9 x 1.1 cm enhancing pleural-based lesion medially (series 2/image 50), previously 1.5 x 1.0 cm.  Underlying moderate paraseptal emphysematous changes.  No pneumothorax.  The heart is normal in size.  No pericardial effusion.  Mediastinal lymphadenopathy, grossly unchanged, including: --6 mm short-axis anterior mediastinal node (series 2/image 21), unchanged --6 mm short-axis high right paratracheal node (series 2/image 22), previously 7 mm --10 mm short axis precarinal node (series 2/image 26), previously 12 mm --10 mm short-axis AP window node (series 2/image 27), previously 12 mm --11 mm short-axis subcarinal node (series 2/image 32), previously 12 mm  Visualized upper abdomen is unremarkable.  Mild degenerative changes of the visualized thoracolumbar spine.  IMPRESSION: 19 x 11 mm enhancing pleural-based lesion at the right lung base with associated rounded atelectasis, possibly mild increased. Attention on follow-up suggested.  Mild mediastinal lymphadenopathy, grossly unchanged.  Improving 4.5 x 2.7 cm thick wall fluid collection in the medial right lung apex, likely infectious/inflammatory.  RECIST 1.1 measurements as above.   Original Report Authenticated By: Charline Bills, M.D.    ASSESSMENT AND PLAN: This is a very pleasant 52 years old white male with metastatic non-small cell lung cancer, adenocarcinoma currently on maintenance treatment with Alimta 500 mg/M2 every 3 weeks. He is tolerating his treatment fairly well. The recent CT scan of the chest showed very mild increase in size of one of the pleural based right lung nodule with improvement in the thickwalled fluid collection in the medial right lung apex. Per Dr. Arbutus Ped the patient's CT scan is relatively stable, however per the parameters of  the study his scan represents disease progression and he is therefore off protocol. He will however continue with the same treatment with single agent Alimta in general. His ANC has recovered and is 3.3 today, however his hemoglobin is lower at 7.5 g/dL. The patient was discussed with also seen by Dr. Arbutus Ped. He is given a set of stool guaiac cards to complete. We will arrange to transfuse him a total of 2 units of packed red blood cells. We all are also going to refer him back to Dr. Jeani Hawking for further evaluation of a possible GI source for his blood loss. He will proceed with his cycle of maintenance single agent Alimta at 500 mg router squared today. He will return in 3 weeks prior to his next scheduled cycle of maintenance Alimta. After this next cycle he will be due  for restaging CT scan and we will include his abdomen and pelvis in addition to his chest for restaging. He is encouraged to increase his by mouth intake of both of food and fluids.   Laural Benes, Darrik Richman E, PA-C   He was advised to call immediately if he has any concerning symptoms in the interval. The patient voices understanding of current disease status and treatment options and is in agreement with the current care plan.  All questions were answered. The patient knows to call the clinic with any problems, questions or concerns. We can certainly see the patient much sooner if necessary.  ADDENDUM: Hematology/Oncology Attending: I have a face to face encounter with the patient. I recommended his care plan. This is a very pleasant 52 years old white male with metastatic non-small cell lung cancer, adenocarcinoma currently undergoing maintenance chemotherapy with single agent Alimta status post 1 cycle off protocol. He tolerated the last cycle fairly well but the patient continues to have fatigue secondary to anemia of unexplained etiology but could be related to chemotherapy versus GI blood loss especially with the rapid decline in  his hemoglobin and hematocrit. We will arrange for the patient to have stool cards for Hemoccult and he will be referred back to Dr. Elnoria Howard for reevaluation. We will arrange for the patient received 2 units of PRBCs transfusion this week. He'll proceed with cycle #2 of his chemotherapy today as scheduled. He was advised to call immediately if he has any concerning symptoms in the interval. Lajuana Matte., MD 09/24/2013

## 2013-09-23 NOTE — Progress Notes (Signed)
09/23/13 at 5:37pm - The pt was into the cancer center for follow up after his chemotherapy was held due to neutropenia last week on 09/16/13.  The pt's neutropenia has improved this week, and Dr. Arbutus Ped wanted the pt to resume his treatment.  The pt completed his PRO's last week,  and they did not have to be repeated today since it was obtained within 2 weeks.  His research blood was not due at this cycle.  His research blood will be drawn at his next cycle.  The pt has chemo-induced anemia and will receive PRBC's this week.  The pt has fatigue from his anemia, but he is able to work again.  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   ECOG=1.  The pt has not been hospitalized.

## 2013-09-24 ENCOUNTER — Telehealth: Payer: Self-pay | Admitting: Internal Medicine

## 2013-09-24 NOTE — Patient Instructions (Signed)
Complete the stool cards per the directions and bring them back in for evaluation You will receive a total of 2 units of blood to address your anemia We are referring you back to Dr. Jeani Hawking for evaluation of a possible GI source for your low hemoglobin Followup in 3 weeks prior to your next scheduled cycle of maintenance chemotherapy

## 2013-09-24 NOTE — Telephone Encounter (Signed)
S/w the pt and is aware that dr Arbutus Ped had mentioned something about him seeing a gastro doctor. Per pt he stated that he will prefer to wait until tomprrow morning to discuss this with me when he comes into his office for the blood transfusion. Pt will get his schedule at that time for the f/u appt in oct.

## 2013-09-25 ENCOUNTER — Telehealth: Payer: Self-pay | Admitting: *Deleted

## 2013-09-25 ENCOUNTER — Telehealth: Payer: Self-pay | Admitting: Internal Medicine

## 2013-09-25 ENCOUNTER — Ambulatory Visit (HOSPITAL_BASED_OUTPATIENT_CLINIC_OR_DEPARTMENT_OTHER): Payer: Medicaid Other

## 2013-09-25 ENCOUNTER — Other Ambulatory Visit: Payer: Self-pay | Admitting: *Deleted

## 2013-09-25 VITALS — BP 122/83 | HR 95 | Temp 97.8°F | Resp 18

## 2013-09-25 DIAGNOSIS — C3490 Malignant neoplasm of unspecified part of unspecified bronchus or lung: Secondary | ICD-10-CM

## 2013-09-25 DIAGNOSIS — D649 Anemia, unspecified: Secondary | ICD-10-CM

## 2013-09-25 MED ORDER — DIPHENHYDRAMINE HCL 25 MG PO CAPS
ORAL_CAPSULE | ORAL | Status: AC
  Filled 2013-09-25: qty 1

## 2013-09-25 MED ORDER — ACETAMINOPHEN 325 MG PO TABS
650.0000 mg | ORAL_TABLET | Freq: Once | ORAL | Status: AC
  Administered 2013-09-25: 650 mg via ORAL

## 2013-09-25 MED ORDER — ACETAMINOPHEN 325 MG PO TABS
ORAL_TABLET | ORAL | Status: AC
  Filled 2013-09-25: qty 2

## 2013-09-25 MED ORDER — SODIUM CHLORIDE 0.9 % IV SOLN
250.0000 mL | Freq: Once | INTRAVENOUS | Status: AC
  Administered 2013-09-25: 250 mL via INTRAVENOUS

## 2013-09-25 MED ORDER — DIPHENHYDRAMINE HCL 25 MG PO CAPS
25.0000 mg | ORAL_CAPSULE | Freq: Once | ORAL | Status: AC
  Administered 2013-09-25: 25 mg via ORAL

## 2013-09-25 NOTE — Telephone Encounter (Signed)
Gave the pt his oct 2014 appt calendar. lmonvm regarding the appt with dr Luisa Hart hung office. S/w kim in medical records and she will fax over the pt's office notes and labs at [phone]. Pt will see his gastro doctor at that time.

## 2013-09-25 NOTE — Patient Instructions (Signed)
[facility] Discharge Instructions for Patients Receiving Blood Transfusion Today you received the following blood transfusion   BELOW ARE SYMPTOMS THAT SHOULD BE REPORTED IMMEDIATELY:  *FEVER GREATER THAN 100.5 F  *CHILLS WITH OR WITHOUT FEVER  NAUSEA AND VOMITING THAT IS NOT CONTROLLED WITH YOUR NAUSEA MEDICATION  *UNUSUAL SHORTNESS OF BREATH  *UNUSUAL BRUISING OR BLEEDING  TENDERNESS IN MOUTH AND THROAT WITH OR WITHOUT PRESENCE OF ULCERS  *URINARY PROBLEMS  *BOWEL PROBLEMS  UNUSUAL RASH Items with * indicate a potential emergency and should be followed up as soon as possible.  Feel free to call the clinic you have any questions or concerns. The clinic phone number is [phone].  Blood Transfusion Information WHAT IS A BLOOD TRANSFUSION? A transfusion is the replacement of blood or some of its parts. Blood is made up of multiple cells which provide different functions.  Red blood cells carry oxygen and are used for blood loss replacement.  White blood cells fight against infection.  Platelets control bleeding.  Plasma helps clot blood.  Other blood products are available for specialized needs, such as hemophilia or other clotting disorders. BEFORE THE TRANSFUSION  Who gives blood for transfusions?   You may be able to donate blood to be used at a later date on yourself (autologous donation).  Relatives can be asked to donate blood. This is generally not any safer than if you have received blood from a stranger. The same precautions are taken to ensure safety when a relative's blood is donated.  Healthy volunteers who are fully evaluated to make sure their blood is safe. This is blood bank blood. Transfusion therapy is the safest it has ever been in the practice of medicine. Before blood is taken from a donor, a complete history is taken to make sure that person has no history of diseases nor engages in risky social behavior (examples are  intravenous drug use or sexual activity with multiple partners). The donor's travel history is screened to minimize risk of transmitting infections, such as malaria. The donated blood is tested for signs of infectious diseases, such as HIV and hepatitis. The blood is then tested to be sure it is compatible with you in order to minimize the chance of a transfusion reaction. If you or a relative donates blood, this is often done in anticipation of surgery and is not appropriate for emergency situations. It takes many days to process the donated blood. RISKS AND COMPLICATIONS Although transfusion therapy is very safe and saves many lives, the main dangers of transfusion include:   Getting an infectious disease.  Developing a transfusion reaction. This is an allergic reaction to something in the blood you were given. Every precaution is taken to prevent this. The decision to have a blood transfusion has been considered carefully by your caregiver before blood is given. Blood is not given unless the benefits outweigh the risks. AFTER THE TRANSFUSION  Right after receiving a blood transfusion, you will usually feel much better and more energetic. This is especially true if your red blood cells have gotten low (anemic). The transfusion raises the level of the red blood cells which carry oxygen, and this usually causes an energy increase.  The nurse administering the transfusion will monitor you carefully for complications. HOME CARE INSTRUCTIONS  No special instructions are needed after a transfusion. You may find your energy is better. Speak with your caregiver about any limitations on activity for underlying diseases you may have. SEEK MEDICAL CARE IF:  Your condition is not improving after your transfusion.  You develop redness or irritation at the intravenous (IV) site. SEEK IMMEDIATE MEDICAL CARE IF:  Any of the following symptoms occur over the next 12 hours:  Shaking chills.  You have a  temperature by mouth above 102 F (38.9 C), not controlled by medicine.  Chest, back, or muscle pain.  People around you feel you are not acting correctly or are confused.  Shortness of breath or difficulty breathing.  Dizziness and fainting.  You get a rash or develop hives.  You have a decrease in urine output.  Your urine turns a dark color or changes to pink, red, or brown. Any of the following symptoms occur over the next 10 days:  You have a temperature by mouth above 102 F (38.9 C), not controlled by medicine.  Shortness of breath.  Weakness after normal activity.  The white part of the eye turns yellow (jaundice).  You have a decrease in the amount of urine or are urinating less often.  Your urine turns a dark color or changes to pink, red, or brown. Document Released: 12/02/2000 Document Revised: 02/27/2012 Document Reviewed: 07/21/2008 [facility] Patient Information 2014 [place], [place].

## 2013-09-25 NOTE — Progress Notes (Signed)
Ordered research labs for 10/14/13

## 2013-09-25 NOTE — Telephone Encounter (Signed)
Per staff message and POF I have scheduled appts.  JMW  

## 2013-09-26 ENCOUNTER — Encounter: Payer: Self-pay | Admitting: Internal Medicine

## 2013-09-26 ENCOUNTER — Telehealth: Payer: Self-pay | Admitting: Internal Medicine

## 2013-09-26 LAB — TYPE AND SCREEN
ABO/RH(D): O POS
Antibody Screen: NEGATIVE
Unit division: 0

## 2013-09-26 NOTE — Progress Notes (Signed)
Called DSS regarding pt's Medicaid status.  Pt needs to contact [employer]SI regarding his Medicaid status.  I spoke with pt and informed him on what to do.  Pt also had financial assistance thru the hospital which expired in June.  I explained that the financial assistance program has changed and I will mail him an application today.

## 2013-09-26 NOTE — Telephone Encounter (Signed)
Faxed pt medical records to Dr Hung °

## 2013-10-04 ENCOUNTER — Telehealth: Payer: Self-pay | Admitting: Medical Oncology

## 2013-10-04 DIAGNOSIS — E876 Hypokalemia: Secondary | ICD-10-CM

## 2013-10-04 MED ORDER — POTASSIUM CHLORIDE CRYS ER 20 MEQ PO TBCR: 20.0000 meq | EXTENDED_RELEASE_TABLET | Freq: Every day | ORAL | Status: DC

## 2013-10-04 NOTE — Telephone Encounter (Signed)
Message copied by Charma Igo on Fri Oct 04, 2013  3:22 PM ------      Message from: Conni Slipper      Created: Fri Oct 04, 2013 11:49 AM       Abnormal results, please call in following prescription and notify patient       KCl 20 meq by mouth for the next 7 days ------

## 2013-10-04 NOTE — Telephone Encounter (Signed)
Called in rx and pt notified. 

## 2013-10-07 ENCOUNTER — Ambulatory Visit: Payer: Self-pay

## 2013-10-07 ENCOUNTER — Encounter (HOSPITAL_COMMUNITY): Payer: Self-pay

## 2013-10-07 ENCOUNTER — Inpatient Hospital Stay (HOSPITAL_COMMUNITY)
Admission: AD | Admit: 2013-10-07 | Discharge: 2013-10-10 | DRG: 809 | Disposition: A | Discharge status: Home / Self Care | Payer: Medicaid Other | Source: Ambulatory Visit | Attending: Internal Medicine | Admitting: Internal Medicine

## 2013-10-07 ENCOUNTER — Other Ambulatory Visit: Payer: Self-pay | Admitting: Physician Assistant

## 2013-10-07 ENCOUNTER — Other Ambulatory Visit (HOSPITAL_BASED_OUTPATIENT_CLINIC_OR_DEPARTMENT_OTHER): Payer: Medicaid Other | Admitting: Lab

## 2013-10-07 ENCOUNTER — Other Ambulatory Visit: Payer: Self-pay | Admitting: *Deleted

## 2013-10-07 ENCOUNTER — Encounter: Payer: Self-pay | Admitting: Internal Medicine

## 2013-10-07 ENCOUNTER — Ambulatory Visit (HOSPITAL_BASED_OUTPATIENT_CLINIC_OR_DEPARTMENT_OTHER): Payer: Medicaid Other | Admitting: Internal Medicine

## 2013-10-07 ENCOUNTER — Inpatient Hospital Stay (HOSPITAL_COMMUNITY): Payer: Medicaid Other

## 2013-10-07 VITALS — BP 130/89 | HR 100 | Temp 98.6°F | Resp 20 | Ht 74.0 in | Wt 163.2 lb

## 2013-10-07 DIAGNOSIS — E039 Hypothyroidism, unspecified: Secondary | ICD-10-CM | POA: Diagnosis present

## 2013-10-07 DIAGNOSIS — D6959 Other secondary thrombocytopenia: Secondary | ICD-10-CM

## 2013-10-07 DIAGNOSIS — F3181 Bipolar II disorder: Secondary | ICD-10-CM | POA: Diagnosis present

## 2013-10-07 DIAGNOSIS — IMO0002 Reserved for concepts with insufficient information to code with codable children: Secondary | ICD-10-CM

## 2013-10-07 DIAGNOSIS — D638 Anemia in other chronic diseases classified elsewhere: Secondary | ICD-10-CM | POA: Diagnosis present

## 2013-10-07 DIAGNOSIS — C3430 Malignant neoplasm of lower lobe, unspecified bronchus or lung: Secondary | ICD-10-CM

## 2013-10-07 DIAGNOSIS — C3490 Malignant neoplasm of unspecified part of unspecified bronchus or lung: Secondary | ICD-10-CM

## 2013-10-07 DIAGNOSIS — D6481 Anemia due to antineoplastic chemotherapy: Secondary | ICD-10-CM

## 2013-10-07 DIAGNOSIS — N179 Acute kidney failure, unspecified: Secondary | ICD-10-CM | POA: Diagnosis present

## 2013-10-07 DIAGNOSIS — Z85118 Personal history of other malignant neoplasm of bronchus and lung: Secondary | ICD-10-CM

## 2013-10-07 DIAGNOSIS — D61810 Antineoplastic chemotherapy induced pancytopenia: Principal | ICD-10-CM | POA: Diagnosis present

## 2013-10-07 DIAGNOSIS — R5381 Other malaise: Secondary | ICD-10-CM

## 2013-10-07 DIAGNOSIS — D630 Anemia in neoplastic disease: Secondary | ICD-10-CM | POA: Diagnosis present

## 2013-10-07 DIAGNOSIS — R682 Dry mouth, unspecified: Secondary | ICD-10-CM

## 2013-10-07 DIAGNOSIS — E440 Moderate protein-calorie malnutrition: Secondary | ICD-10-CM | POA: Diagnosis present

## 2013-10-07 DIAGNOSIS — D709 Neutropenia, unspecified: Secondary | ICD-10-CM | POA: Diagnosis present

## 2013-10-07 DIAGNOSIS — E871 Hypo-osmolality and hyponatremia: Secondary | ICD-10-CM | POA: Diagnosis present

## 2013-10-07 DIAGNOSIS — T451X5A Adverse effect of antineoplastic and immunosuppressive drugs, initial encounter: Secondary | ICD-10-CM

## 2013-10-07 DIAGNOSIS — E876 Hypokalemia: Secondary | ICD-10-CM | POA: Diagnosis present

## 2013-10-07 DIAGNOSIS — F3189 Other bipolar disorder: Secondary | ICD-10-CM | POA: Diagnosis present

## 2013-10-07 HISTORY — DX: Moderate protein-calorie malnutrition: E44.0

## 2013-10-07 HISTORY — DX: Hypothyroidism, unspecified: E03.9

## 2013-10-07 LAB — CBC WITH DIFFERENTIAL/PLATELET
Basophils Absolute: 0 10*3/uL (ref 0.0–0.1)
Basophils Absolute: 0 10*3/uL (ref 0.0–0.1)
Eosinophils Absolute: 0 10*3/uL (ref 0.0–0.5)
Eosinophils Relative: 3 % (ref 0–5)
HCT: 16.4 % — ABNORMAL LOW (ref 39.0–52.0)
HGB: 7 g/dL — ABNORMAL LOW (ref 13.0–17.1)
LYMPH%: 75.4 % — ABNORMAL HIGH (ref 14.0–49.0)
Lymphs Abs: 0.1 10*3/uL — ABNORMAL LOW (ref 0.7–4.0)
MCHC: 36.1 g/dL — ABNORMAL HIGH (ref 32.0–36.0)
MCV: 97.5 fL (ref 79.3–98.0)
MCV: 96.5 fL (ref 78.0–100.0)
MONO#: 0 10*3/uL — ABNORMAL LOW (ref 0.1–0.9)
MONO%: 2.5 % (ref 0.0–14.0)
Monocytes Absolute: 0 10*3/uL — ABNORMAL LOW (ref 0.1–1.0)
Monocytes Relative: 6 % (ref 3–12)
NEUT#: 0.3 10*3/uL — CL (ref 1.5–6.5)
Neutrophils Relative %: 69 % (ref 43–77)
Platelets: 14 10*3/uL — ABNORMAL LOW (ref 140–400)
Platelets: 13 10*3/uL — CL (ref 150–400)
RBC: 1.99 10*6/uL — ABNORMAL LOW (ref 4.20–5.82)
RBC: 1.7 MIL/uL — ABNORMAL LOW (ref 4.22–5.81)
RDW: 20.1 % — ABNORMAL HIGH (ref 11.0–14.6)
RDW: 20 % — ABNORMAL HIGH (ref 11.5–15.5)
WBC: 1.2 10*3/uL — ABNORMAL LOW (ref 4.0–10.3)
WBC: 0.3 10*3/uL — CL (ref 4.0–10.5)
lymph#: 0.9 10*3/uL (ref 0.9–3.3)

## 2013-10-07 LAB — COMPREHENSIVE METABOLIC PANEL (CC13)
ALT: 11 U/L (ref 0–55)
AST: 36 U/L — ABNORMAL HIGH (ref 5–34)
Anion Gap: 14 mEq/L — ABNORMAL HIGH (ref 3–11)
CO2: 24 mEq/L (ref 22–29)
Calcium: 9.3 mg/dL (ref 8.4–10.4)
Chloride: 94 mEq/L — ABNORMAL LOW (ref 98–109)
Glucose: 106 mg/dl (ref 70–140)
Sodium: 132 mEq/L — ABNORMAL LOW (ref 136–145)
Total Bilirubin: 0.87 mg/dL (ref 0.20–1.20)
Total Protein: 7.5 g/dL (ref 6.4–8.3)

## 2013-10-07 LAB — COMPREHENSIVE METABOLIC PANEL
ALT: 11 U/L (ref 0–53)
AST: 36 U/L (ref 0–37)
BUN: 13 mg/dL (ref 6–23)
CO2: 27 mEq/L (ref 19–32)
Calcium: 9.2 mg/dL (ref 8.4–10.5)
Creatinine, Ser: 1.5 mg/dL — ABNORMAL HIGH (ref 0.50–1.35)
GFR calc Af Amer: 60 mL/min — ABNORMAL LOW (ref >90)
GFR calc non Af Amer: 52 mL/min — ABNORMAL LOW (ref >90)
Glucose, Bld: 88 mg/dL (ref 70–99)
Sodium: 128 mEq/L — ABNORMAL LOW (ref 135–145)
Total Protein: 7.1 g/dL (ref 6.0–8.3)

## 2013-10-07 LAB — URINALYSIS, ROUTINE W REFLEX MICROSCOPIC
Bilirubin Urine: NEGATIVE
Glucose, UA: NEGATIVE mg/dL
Hgb urine dipstick: NEGATIVE
Specific Gravity, Urine: 1.011 (ref 1.005–1.030)
Urobilinogen, UA: 0.2 mg/dL (ref 0.0–1.0)

## 2013-10-07 LAB — PROTIME-INR: INR: 0.94 (ref 0.00–1.49)

## 2013-10-07 LAB — MAGNESIUM: Magnesium: 1.8 mg/dL (ref 1.5–2.5)

## 2013-10-07 LAB — PREPARE RBC (CROSSMATCH)

## 2013-10-07 MED ORDER — ONDANSETRON HCL 4 MG/2ML IJ SOLN: 4.0000 mg | Freq: Four times a day (QID) | INTRAMUSCULAR | Status: DC | PRN

## 2013-10-07 MED ORDER — FILGRASTIM 300 MCG/ML IJ SOLN
300.0000 ug | Freq: Every day | INTRAMUSCULAR | Status: DC
  Administered 2013-10-07 – 2013-10-08 (×2): 300 ug via SUBCUTANEOUS
  Filled 2013-10-07 (×4): qty 1

## 2013-10-07 MED ORDER — POTASSIUM CHLORIDE CRYS ER 20 MEQ PO TBCR
40.0000 meq | EXTENDED_RELEASE_TABLET | Freq: Once | ORAL | Status: AC
  Administered 2013-10-07: 40 meq via ORAL
  Filled 2013-10-07: qty 2

## 2013-10-07 MED ORDER — BOOST PLUS PO LIQD
237.0000 mL | Freq: Three times a day (TID) | ORAL | Status: DC
  Administered 2013-10-07 – 2013-10-09 (×3): 237 mL via ORAL
  Filled 2013-10-07 (×9): qty 237

## 2013-10-07 MED ORDER — ACETAMINOPHEN 650 MG RE SUPP: 650.0000 mg | Freq: Four times a day (QID) | RECTAL | Status: DC | PRN

## 2013-10-07 MED ORDER — ACETAMINOPHEN 325 MG PO TABS: 650.0000 mg | ORAL_TABLET | Freq: Four times a day (QID) | ORAL | Status: DC | PRN

## 2013-10-07 MED ORDER — ALBUTEROL SULFATE (5 MG/ML) 0.5% IN NEBU: 2.5000 mg | INHALATION_SOLUTION | RESPIRATORY_TRACT | Status: DC | PRN

## 2013-10-07 MED ORDER — HYDROCODONE-ACETAMINOPHEN 5-325 MG PO TABS
1.0000 | ORAL_TABLET | ORAL | Status: DC | PRN
  Administered 2013-10-09: 2 via ORAL
  Filled 2013-10-07: qty 2

## 2013-10-07 MED ORDER — LAMOTRIGINE 150 MG PO TABS
150.0000 mg | ORAL_TABLET | Freq: Every day | ORAL | Status: DC
  Administered 2013-10-07 – 2013-10-09 (×3): 150 mg via ORAL
  Filled 2013-10-07 (×4): qty 1

## 2013-10-07 MED ORDER — ADULT MULTIVITAMIN W/MINERALS CH
1.0000 | ORAL_TABLET | Freq: Every day | ORAL | Status: DC
  Administered 2013-10-07 – 2013-10-09 (×3): 1 via ORAL
  Filled 2013-10-07 (×4): qty 1

## 2013-10-07 MED ORDER — ONDANSETRON HCL 4 MG PO TABS: 4.0000 mg | ORAL_TABLET | Freq: Four times a day (QID) | ORAL | Status: DC | PRN

## 2013-10-07 MED ORDER — MIRTAZAPINE 45 MG PO TABS
45.0000 mg | ORAL_TABLET | Freq: Every day | ORAL | Status: DC
  Administered 2013-10-07 – 2013-10-09 (×3): 45 mg via ORAL
  Filled 2013-10-07 (×4): qty 1

## 2013-10-07 MED ORDER — SODIUM CHLORIDE 0.9 % IV SOLN
INTRAVENOUS | Status: DC
  Administered 2013-10-07 – 2013-10-09 (×2): via INTRAVENOUS

## 2013-10-07 MED ORDER — LEVOFLOXACIN IN D5W 750 MG/150ML IV SOLN
750.0000 mg | INTRAVENOUS | Status: DC
  Administered 2013-10-07 – 2013-10-08 (×2): 750 mg via INTRAVENOUS
  Filled 2013-10-07 (×3): qty 150

## 2013-10-07 NOTE — H&P (Signed)
[facility] History and Physical  Jay Watts WUJ:[id number] DOB: 23-Sep-1961 DOA: 10/07/2013  Referring physician: ER physician PCP: Geraldo Pitter, MD   Chief Complaint: shortness of breath, fatigue  HPI:  52 year old male with past medical history of metastatic non-small cell lung adenocarcinoma initially diagnosed in 11/2012, currently on alimta chemotherapy under Dr. Asa Lente care, last therapy 09/23/2014 who was [facility] after his blood work revealed significant pancytopenia. Pt reports feeling short of breath and associated fatigue and weakness. No chest pain, no palpitations. No abdominal pain, nausea or vomiting. No blood in stool or urein. No fever or chills. This is a direct admission and blood work available from cancer center revealed WBC count of 1.2, hemoglobin of 7 and platelet count of 14.  Assessment and Plan:  Principal Problem:   Adenocarcinoma, lung - on chemotherapy, last therapy 09/23/2013 - appreciate Dr. Arbutus Ped seeing the pt - alimta on hold Active Problems:   Antineoplastic chemotherapy induced pancytopenia - secondary to recent chemotherapy - for neutropenia we are giving Neupogen - will need 2 units PRBC transfusion - will need 1 unit platelet transfusion   Bipolar 1 disorder - continue lamictal   Anemia of chronic disease - secondary to history of malignancy and sequela of chemotherapy - will get 2 units PRBC transfusion  today   Neutropenia - secondary to chemotherapy - Neupogen 300 mcg daily   Acute renal failure - secondary to dehydration - continue IV fluids - follow up BMP in am   Hyponatremia - secondary to dehydration - continue IV fluids  Radiological Exams on Admission: No results found.   Code Status: Full Family Communication: Pt at bedside Disposition Plan: Admit for further evaluation  Manson Passey, MD  [facility] Pager [phone]  Review of Systems:  Constitutional: Negative for fever,  chills and positive for malaise/fatigue. Negative for diaphoresis.  HENT: Negative for hearing loss, ear pain, nosebleeds, congestion, sore throat, neck pain, tinnitus and ear discharge.   Eyes: Negative for blurred vision, double vision, photophobia, pain, discharge and redness.  Respiratory: Negative for cough, hemoptysis, sputum production, positive for shortness of breath, wheezing and stridor.   Cardiovascular: Negative for chest pain, palpitations, orthopnea, claudication and leg swelling.  Gastrointestinal: Negative for nausea, vomiting and abdominal pain. Negative for heartburn, constipation, blood in stool and melena.  Genitourinary: Negative for dysuria, urgency, frequency, hematuria and flank pain.  Musculoskeletal: Negative for myalgias, back pain, joint pain and falls.  Skin: Negative for itching and rash.  Neurological: Negative for dizziness and positive for weakness. Negative for tingling, tremors, sensory change, speech change, focal weakness, loss of consciousness and headaches.  Endo/Heme/Allergies: Negative for environmental allergies and polydipsia. Does not bruise/bleed easily.  Psychiatric/Behavioral: Negative for suicidal ideas. The patient is not nervous/anxious.      Past Medical History  Diagnosis Date  . Bipolar 2 disorder   . Shortness of breath     with exertion  . Pneumothorax     right side- spring 2013  . Lung cancer   . Adenocarcinoma, lung 11/30/2012   Past Surgical History  Procedure Laterality Date  . Turbinate reduction    . Chest tube insertion  11/29/2012    Procedure: INSERTION PLEURAL DRAINAGE CATHETER;  Surgeon: [name], MD;  Location: [facility] OR;  Service: Thoracic;  Laterality: Right;  . Removal of pleural drainage catheter Right 02/14/2013    Procedure: REMOVAL OF PLEURAL DRAINAGE CATHETER;  Surgeon: [name], MD;  Location: [facility] OR;  Service:  Thoracic;  Laterality: Right;   Social History:  reports that he quit smoking about 10  months ago. His smoking use included Cigarettes. He has a 35 pack-year smoking history. He quit smokeless tobacco use about 10 months ago. He reports that he does not drink alcohol or use illicit drugs.  No Known Allergies  Family History  Problem Relation Age of Onset  . Cancer Mother     breast     Prior to Admission medications   Medication Sig Start Date End Date Taking? Authorizing Provider  lamoTRIgine (LAMICTAL) 150 MG tablet Take 150 mg by mouth at bedtime.   Yes Historical Provider, MD  mirtazapine (REMERON) 45 MG tablet Take 45 mg by mouth at bedtime.   Yes Historical Provider, MD  Multiple Vitamin (MULTIVITAMIN WITH MINERALS) TABS Take 1 tablet by mouth daily.   Yes Historical Provider, MD  PRESCRIPTION MEDICATION Inject into the vein every 21 ( twenty-one) days. Alimta infusion q3weeks.    Historical Provider, MD   Physical Exam: There were no vitals filed for this visit.  Physical Exam  Constitutional: Appears well-developed and well-nourished. No distress.  HENT: Normocephalic. External right and left ear normal. Oropharynx is clear and moist.  Eyes: Conjunctivae and EOM are normal. PERRLA, no scleral icterus.  Neck: Normal ROM. Neck supple. No JVD. No tracheal deviation. No thyromegaly.  CVS: RRR, S1/S2 +, no murmurs, no gallops, no carotid bruit.  Pulmonary: Effort and breath sounds normal, no stridor, rhonchi, wheezes, rales.  Abdominal: Soft. BS +,  no distension, tenderness, rebound or guarding.  Musculoskeletal: Normal range of motion. Pulses palpable bilaterally..  Lymphadenopathy: No lymphadenopathy noted, cervical, inguinal. Neuro: Alert. Normal reflexes, muscle tone coordination. No cranial nerve deficit. Skin: Skin is warm and dry. No rash noted. Not diaphoretic. No erythema. No pallor.  Psychiatric: Normal mood and affect. Behavior, judgment, thought content normal.   Labs on Admission:  Basic Metabolic Panel:  Recent Labs Lab 10/07/13 0856  NA 132*   K 3.7  CO2 24  GLUCOSE 106  BUN 12.2  CREATININE 1.5*  CALCIUM 9.3   Liver Function Tests:  Recent Labs Lab 10/07/13 0856  AST 36*  ALT 11  ALKPHOS 70  BILITOT 0.87  PROT 7.5  ALBUMIN 3.3*   No results found for this basename: LIPASE, AMYLASE,  in the last 168 hours No results found for this basename: AMMONIA,  in the last 168 hours CBC:  Recent Labs Lab 10/07/13 0856  WBC 1.2*  NEUTROABS 0.3*  HGB 7.0*  HCT 19.4*  MCV 97.5  PLT 14*   Cardiac Enzymes: No results found for this basename: CKTOTAL, CKMB, CKMBINDEX, TROPONINI,  in the last 168 hours BNP: No components found with this basename: POCBNP,  CBG: No results found for this basename: GLUCAP,  in the last 168 hours  If 7PM-7AM, please contact night-coverage [email or web address] Password [email or web address] 10/07/2013, 11:21 AM

## 2013-10-07 NOTE — Progress Notes (Signed)
INITIAL NUTRITION ASSESSMENT  Pt meets criteria for moderate MALNUTRITION in the context of chronic illness as evidenced by <75% estimated energy intake for months per pt report in addition to pt with visible mild/moderate muscle wasting and subcutaneous fat loss throughout body.  DOCUMENTATION CODES Per approved criteria  -Non-severe (moderate) malnutrition in the context of chronic illness   INTERVENTION: - Recommend MD consider appetite stimulant to help with ongoing poor appetite. Pt also interested in this.  - Boost Plus TID - Recommend MD consider palliative care consult r/t pt's reported decline in the past month - Will continue to monitor   NUTRITION DIAGNOSIS: Inadequate oral intake related to poor appetite, taste changes, stomach discomfort as evidenced by pt report.    Goal: Pt to consume 100% of meals and supplements  Monitor:  Weights, labs, intake  Reason for Assessment: Consult   52 y.o. male  Admitting Dx: Adenocarcinoma, lung  ASSESSMENT: Pt with metastatic non-small cell lung CA s/p 4 cycles of systemic chemotherapy and s/p 6 cycles of maintenance chemotherapy. Met with pt who reports not eating well r/t changes in taste and stomach discomfort. Pt reports he has "tried everything" from getting high protein yogurt to drinking Boost, typically 1-2 per day. States his weight has been stable. Was able to eat most of his soup and one chicken tender on his lunch tray. C/o weakness PTA. States he has had a big decline in the past month, was working 1 month ago and now is tired most of the time and getting blood for low hemoglobin. Denies feeling any better after getting IVF and blood transfusion.   Potassium low, getting oral replacement and daily multivitamin.   Nutrition Focused Physical Exam:  Subcutaneous Fat:  Orbital Region: mild/moderate wasting Upper Arm Region: mild/moderate wasting Thoracic and Lumbar Region: NA  Muscle:  Temple Region: mild/moderate  wasting Clavicle Bone Region: mild/moderate wasting Clavicle and Acromion Bone Region: mild/moderate wasting Scapular Bone Region: NA Dorsal Hand: mild/moderate wasting Patellar Region: severe wasting Anterior Thigh Region: mild/moderate wasting Posterior Calf Region: mild/moderate wasting  Edema: +2 RLE, LLE edema   Height: Ht Readings from Last 1 Encounters:  10/07/13 6\' 2"  (1.88 m)    Weight: Wt Readings from Last 1 Encounters:  10/07/13 163 lb (73.936 kg)    Ideal Body Weight: 190 lb   % Ideal Body Weight: 86%  Wt Readings from Last 10 Encounters:  10/07/13 163 lb (73.936 kg)  10/07/13 163 lb 3.2 oz (74.027 kg)  09/23/13 165 lb 12.8 oz (75.206 kg)  09/16/13 163 lb 4.8 oz (74.072 kg)  08/26/13 161 lb 12.8 oz (73.392 kg)  08/05/13 163 lb 6.4 oz (74.118 kg)  07/15/13 162 lb (73.483 kg)  06/24/13 162 lb 11.2 oz (73.8 kg)  06/03/13 156 lb 6.4 oz (70.943 kg)  05/27/13 158 lb 8 oz (71.895 kg)    Usual Body Weight: 161-165 lb   % Usual Body Weight: 99-101%  BMI:  Body mass index is 20.92 kg/(m^2).  Estimated Nutritional Needs: Kcal: 2250-2450 Protein: 115-130g Fluid: 2.2-2.4L/day  Skin: +2 RLE, LLE edema  Diet Order: General  EDUCATION NEEDS: -No education needs identified at this time  No intake or output data in the 24 hours ending 10/07/13 1638  Last BM: 10/20  Labs:   Recent Labs Lab 10/07/13 0856 10/07/13 1245  NA 132* 128*  K 3.7 3.0*  CL  --  89*  CO2 24 27  BUN 12.2 13  CREATININE 1.5* 1.50*  CALCIUM 9.3 9.2  MG  --  1.8  PHOS  --  3.0  GLUCOSE 106 88    CBG (last 3)  No results found for this basename: GLUCAP,  in the last 72 hours  Scheduled Meds: . filgrastim (NEUPOGEN)  SQ  300 mcg Subcutaneous q1800  . lamoTRIgine  150 mg Oral QHS  . levofloxacin  750 mg Intravenous Q24H  . mirtazapine  45 mg Oral QHS  . multivitamin with minerals  1 tablet Oral Daily    Continuous Infusions: . sodium chloride 75 mL/hr at 10/07/13  1311    Past Medical History  Diagnosis Date  . Bipolar 2 disorder   . Shortness of breath     with exertion  . Pneumothorax     right side- spring 2013  . Lung cancer   . Adenocarcinoma, lung 11/30/2012    Past Surgical History  Procedure Laterality Date  . Turbinate reduction    . Chest tube insertion  11/29/2012    Procedure: INSERTION PLEURAL DRAINAGE CATHETER;  Surgeon: [name], MD;  Location: [facility] OR;  Service: Thoracic;  Laterality: Right;  . Removal of pleural drainage catheter Right 02/14/2013    Procedure: REMOVAL OF PLEURAL DRAINAGE CATHETER;  Surgeon: [name], MD;  Location: [facility] OR;  Service: Thoracic;  Laterality: Right;    Levon Hedger MS, RD, LDN [phone] Pager [phone] After Hours Pager

## 2013-10-07 NOTE — Progress Notes (Signed)
[facility] [phone]   Fax:([phone]  OFFICE PROGRESS NOTE  Geraldo Pitter, MD [address] [place] [address]  DIAGNOSIS: Metastatic non-small cell lung cancer, adenocarcinoma with negative EGFR mutation and negative ALK gene translocation diagnosed in December of 2013.   PRIOR THERAPY:  1. Status post Pleurx catheter placement for drainage of right-sided pleural effusion under the care of Dr. Laneta Simmers on 11/29/2012.  2. Systemic chemotherapy with carboplatin for AUC of 6, paclitaxel 200 mg/M2 and Avastin 15 mg/kg every 3 weeks according to the ECoG protocol 5508. Status post 4 cycles.  3. Maintenance chemotherapy according to the ECoG 5508 protocol, patient has been randomized to pemetrexed 500 mg per meter squared given every 3 weeks. Status post 6 cycles.  CURRENT THERAPY: Maintenance chemotherapy with single agent Alimta at 500 mg per meter squared given every 3 weeks status post 2 cycles. Last dose was given 09/16/2013 discontinued today secondary to intolerance.  CHEMOTHERAPY INTENT: Palliative  CURRENT # OF CHEMOTHERAPY CYCLES: 8 CURRENT ANTIEMETICS: Zofran, dexamethasone and Compazine  CURRENT SMOKING STATUS: Former smoker quit 11/25/2012  ORAL CHEMOTHERAPY AND CONSENT: None  CURRENT BISPHOSPHONATES USE: None  PAIN MANAGEMENT: 0/10  NARCOTICS INDUCED CONSTIPATION: None  LIVING WILL AND CODE STATUS: No CODE BLUE  INTERVAL HISTORY: Dominiq Fontaine 52 y.o. male returns to the clinic today for followup visit. The patient came to the clinic today complaining of increasing fatigue and weakness as well as significant swelling and redness in the lower extremities. His CBC today showed significant neutropenia, anemia and thrombocytopenia secondary to his recent chemotherapy. The patient denied having any fever or chills. He denied having any nausea or vomiting. He continues to have lack of appetite and weight loss.  MEDICAL  HISTORY: Past Medical History  Diagnosis Date  . Bipolar 2 disorder   . Shortness of breath     with exertion  . Pneumothorax     right side- spring 2013  . Lung cancer   . Adenocarcinoma, lung 11/30/2012    ALLERGIES:  has No Known Allergies.  MEDICATIONS:  Current Outpatient Prescriptions  Medication Sig Dispense Refill  . lamoTRIgine (LAMICTAL) 150 MG tablet Take 150 mg by mouth at bedtime.      . mirtazapine (REMERON) 45 MG tablet Take 45 mg by mouth at bedtime.      . Multiple Vitamin (MULTIVITAMIN WITH MINERALS) TABS Take 1 tablet by mouth daily.      . potassium chloride SA (K-DUR,KLOR-CON) 20 MEQ tablet Take 1 tablet (20 mEq total) by mouth daily.  7 tablet  0  . PRESCRIPTION MEDICATION Inject into the vein every 21 ( twenty-one) days. Alimta infusion q3weeks.       No current facility-administered medications for this visit.    SURGICAL HISTORY:  Past Surgical History  Procedure Laterality Date  . Turbinate reduction    . Chest tube insertion  11/29/2012    Procedure: INSERTION PLEURAL DRAINAGE CATHETER;  Surgeon: [name], MD;  Location: [facility] OR;  Service: Thoracic;  Laterality: Right;  . Removal of pleural drainage catheter Right 02/14/2013    Procedure: REMOVAL OF PLEURAL DRAINAGE CATHETER;  Surgeon: [name], MD;  Location: [facility] OR;  Service: Thoracic;  Laterality: Right;    REVIEW OF SYSTEMS:  Constitutional: positive for anorexia, fatigue and weight loss Eyes: negative Ears, nose, mouth, throat, and face: negative Respiratory: negative Cardiovascular: negative Gastrointestinal: negative Genitourinary:negative Integument/breast: negative Hematologic/lymphatic: positive for easy bruising and  petechiae Musculoskeletal:positive for muscle weakness Neurological: negative Behavioral/Psych: negative Endocrine: negative Allergic/Immunologic: negative   PHYSICAL EXAMINATION: General appearance: alert, cooperative, fatigued and no distress Head:  Normocephalic, without obvious abnormality, atraumatic Neck: no adenopathy, no JVD, supple, symmetrical, trachea midline and thyroid not enlarged, symmetric, no tenderness/mass/nodules Lymph nodes: Cervical, supraclavicular, and axillary nodes normal. Resp: clear to auscultation bilaterally Back: symmetric, no curvature. ROM normal. No CVA tenderness. Cardio: regular rate and rhythm, S1, S2 normal, no murmur, click, rub or gallop GI: soft, non-tender; bowel sounds normal; no masses,  no organomegaly Extremities: extremities normal, atraumatic, no cyanosis or edema Neurologic: Alert and oriented X 3, normal strength and tone. Normal symmetric reflexes. Normal coordination and gait  ECOG PERFORMANCE STATUS: 1 - Symptomatic but completely ambulatory  Blood pressure 130/89, pulse 100, temperature 98.6 F (37 C), temperature source Oral, resp. rate 20, height 6\' 2"  (1.88 m), weight 163 lb 3.2 oz (74.027 kg).  LABORATORY DATA: Lab Results  Component Value Date   WBC 1.2* 10/07/2013   HGB 7.0* 10/07/2013   HCT 19.4* 10/07/2013   MCV 97.5 10/07/2013   PLT 14* 10/07/2013      Chemistry      Component Value Date/Time   NA 132* 10/07/2013 0856   NA 135 05/17/2013 1320   K 3.7 10/07/2013 0856   K 3.9 05/17/2013 1320   CL 98 06/03/2013 0914   CL 97 05/17/2013 1320   CO2 24 10/07/2013 0856   CO2 27 05/17/2013 1320   BUN 12.2 10/07/2013 0856   BUN 11 05/17/2013 1320   CREATININE 1.5* 10/07/2013 0856   CREATININE 0.53 05/17/2013 1320      Component Value Date/Time   CALCIUM 9.3 10/07/2013 0856   CALCIUM 8.9 05/17/2013 1320   ALKPHOS 70 10/07/2013 0856   ALKPHOS 82 11/26/2012 0500   AST 36* 10/07/2013 0856   AST 13 11/26/2012 0500   ALT 11 10/07/2013 0856   ALT 9 11/26/2012 0500   BILITOT 0.87 10/07/2013 0856   BILITOT 0.3 11/26/2012 0500       RADIOGRAPHIC STUDIES: No results found.  ASSESSMENT AND PLAN: This is a very pleasant 53 years old white male with metastatic non-small cell  lung cancer, adenocarcinoma status post induction chemotherapy with carboplatin, paclitaxel and Avastin followed by maintenance Alimta status post 8 cycles. The patient has significant pancytopenia secondary to his most recent chemotherapy with single agent Alimta. I have a lengthy discussion with the patient today about his condition. I recommended for the patient to be admitted to [facility] for further evaluation and management of his severe pancytopenia. The patient required 2 units of PRBCs transfusion in addition to one unit of platelets. I would is to keep his hemoglobin over 8.0 G./DL and platelets count to over 20,000 especially with the significant bruises he has. Would also start the patient on Neupogen 300 mcg subcutaneously on daily basis to keep his absolute neutrophil count to over 1000. I would also consider starting the patient on empiric Avelox 400 mg IV daily. I will continue to follow up the patient during his hospitalization and add further recommendations on an as-needed basis. I would discontinue his treatment with Alimta at this point and would consider the patient for other treatment options in the future after recovery of his severe pancytopenia. The patient agreed to the current plan. The patient voices understanding of current disease status and treatment options and is in agreement with the current care plan.  All questions were answered. The patient  knows to call the clinic with any problems, questions or concerns. We can certainly see the patient much sooner if necessary.  I spent 15 minutes counseling the patient face to face. The total time spent in the appointment was 25 minutes.

## 2013-10-07 NOTE — Care Management Note (Signed)
   CARE MANAGEMENT NOTE 10/07/2013  Patient:  Jay Watts, Jay Watts   Account Number:  [id number]  Date Initiated:  10/07/2013  Documentation initiated by:  Verona Hartshorn  Subjective/Objective Assessment:   52 yo male admitted with pancytopenia.  PCP: Geraldo Pitter, MD     Action/Plan:   Home when stable   Anticipated DC Date:     Anticipated DC Plan:  HOME/SELF CARE      DC Planning Services  CM consult      Choice offered to / List presented to:  NA   DME arranged  NA      DME agency  NA     HH arranged  NA      HH agency  NA   Status of service:  In process, will continue to follow Medicare Important Message given?   (If response is "NO", the following Medicare IM given date fields will be blank) Date Medicare IM given:   Date Additional Medicare IM given:    Discharge Disposition:    Per UR Regulation:  Reviewed for med. necessity/level of care/duration of stay  If discussed at Long Length of Stay Meetings, dates discussed:    Comments:  10/07/13 1400 Roland Earl [phone] Chart reviewed for utilization of services. Self  pay. Financial Advisor to consult. MD order for PT/OT eval. Will follow to further assess dc needs.

## 2013-10-07 NOTE — Progress Notes (Signed)
OT/ PT Cancellation Note  Patient Details Name: Jay Watts MRN: [id number] DOB: 13-Jan-1961   Cancelled Treatment:    Reason Eval/Treat Not Completed: Patient declined, no reason specified. Pt stated it had been a bad month  Pt agreed to therapy tomorrow.   Alba Cory 10/07/2013, 2:09 PM Rebeca Alert, MPT Pager: [phone]

## 2013-10-08 ENCOUNTER — Encounter: Payer: Self-pay | Admitting: *Deleted

## 2013-10-08 DIAGNOSIS — E440 Moderate protein-calorie malnutrition: Secondary | ICD-10-CM

## 2013-10-08 HISTORY — DX: Moderate protein-calorie malnutrition: E44.0

## 2013-10-08 LAB — COMPREHENSIVE METABOLIC PANEL
AST: 33 U/L (ref 0–37)
Albumin: 3 g/dL — ABNORMAL LOW (ref 3.5–5.2)
Alkaline Phosphatase: 65 U/L (ref 39–117)
CO2: 25 mEq/L (ref 19–32)
Chloride: 92 mEq/L — ABNORMAL LOW (ref 96–112)
Creatinine, Ser: 1.5 mg/dL — ABNORMAL HIGH (ref 0.50–1.35)
GFR calc non Af Amer: 52 mL/min — ABNORMAL LOW (ref >90)
Potassium: 3.3 mEq/L — ABNORMAL LOW (ref 3.5–5.1)
Sodium: 129 mEq/L — ABNORMAL LOW (ref 135–145)
Total Bilirubin: 0.7 mg/dL (ref 0.3–1.2)

## 2013-10-08 LAB — PREPARE PLATELET PHERESIS: Unit division: 0

## 2013-10-08 LAB — GLUCOSE, CAPILLARY: Glucose-Capillary: 107 mg/dL — ABNORMAL HIGH (ref 70–99)

## 2013-10-08 LAB — CBC
Platelets: 37 10*3/uL — ABNORMAL LOW (ref 150–400)
RDW: 19.1 % — ABNORMAL HIGH (ref 11.5–15.5)
WBC: 1.4 10*3/uL — CL (ref 4.0–10.5)

## 2013-10-08 LAB — PREPARE RBC (CROSSMATCH)

## 2013-10-08 MED ORDER — POTASSIUM CHLORIDE CRYS ER 20 MEQ PO TBCR
40.0000 meq | EXTENDED_RELEASE_TABLET | Freq: Once | ORAL | Status: AC
  Administered 2013-10-08: 40 meq via ORAL
  Filled 2013-10-08: qty 2

## 2013-10-08 NOTE — Progress Notes (Signed)
PT Cancellation Note  Patient Details Name: Jay Watts MRN: [id number] DOB: 11/28/1961   Cancelled Treatment:    Reason Eval/Treat Not Completed: Noted pt's refusal to participate with therapy today-spoke with OT who stated pt declined. Will check back on tomorrow to see if pt is willing to participate with therapy. Thanks.    Rebeca Alert, MPT Pager: [phone]

## 2013-10-08 NOTE — Progress Notes (Signed)
Subjective: Jay Watts is seen and examined today. He is feeling much better this morning after receiving 2 units of PRBCs transfusion as well as one unit of platelets. He was started on treatment with Neupogen and his white blood count improved. He denied having any significant fever or chills. He has no nausea or vomiting. He still have swelling and erythema of the lower extremities.  Objective: Vital signs in last 24 hours: Temp:  [97.5 F (36.4 C)-99 F (37.2 C)] 97.5 F (36.4 C) (10/21 0419) Pulse Rate:  [76-100] 76 (10/21 0419) Resp:  [16-20] 18 (10/21 0419) BP: (96-130)/(60-89) 124/70 mmHg (10/21 0419) SpO2:  [92 %-97 %] 96 % (10/21 0419) Weight:  [163 lb (73.936 kg)-163 lb 3.2 oz (74.027 kg)] 163 lb (73.936 kg) (10/20 1105)  Intake/Output from previous day: 10/20 0701 - 10/21 0700 In: 602.5 [P.O.:240; Blood:362.5] Out: -  Intake/Output this shift:    General appearance: alert, cooperative, fatigued and no distress Resp: clear to auscultation bilaterally Cardio: regular rate and rhythm, S1, S2 normal, no murmur, click, rub or gallop GI: soft, non-tender; bowel sounds normal; no masses,  no organomegaly Extremities: extremities normal, atraumatic, no cyanosis or edema  Lab Results:   Recent Labs  10/07/13 1245 10/08/13 0425  WBC 0.3* 1.4*  HGB 6.0* 7.1*  HCT 16.4* 19.6*  PLT 13* 37*   BMET  Recent Labs  10/07/13 1245 10/08/13 0425  NA 128* 129*  K 3.0* 3.3*  CL 89* 92*  CO2 27 25  GLUCOSE 88 78  BUN 13 12  CREATININE 1.50* 1.50*  CALCIUM 9.2 8.7    Studies/Results: Portable Chest 1 View  10/07/2013   CLINICAL DATA:  Shortness of breath. History of lung cancer.  EXAM: PORTABLE CHEST - 1 VIEW  COMPARISON:  Chest CT 08/23/2013. Radiographs 02/12/2013.  FINDINGS: 1251 hr. Right suprahilar volume loss and subpleural density have not substantially changed. The right lower lobe paraspinous density seen on CT is not well visualized. There is a stable loculated  right pleural effusion following removal of the right pleural catheter. The left lung is clear. The visualized heart size and mediastinal contours are stable. There is no pneumothorax.  IMPRESSION: Grossly stable findings in the right hemi thorax compared with CT of 7 weeks prior. No acute process identified.   Electronically Signed   By: Roxy Horseman M.D.   On: 10/07/2013 15:11    Medications: I have reviewed the patient's current medications.  Assessment/Plan: 1) metastatic non-small cell lung cancer, adenocarcinoma status post induction chemotherapy with carboplatin, paclitaxel and Avastin followed by 8 cycles of maintenance Alimta. His last treatment was complicated with severe pancytopenia. I would consider changing his treatment after discharge. 2) pancytopenia: consider transfusing the patient with one more unit of PRBCs to keep his hemoglobin above 8.0 G./DL. The patient received 1 unit of platelets and no need for platelet transfusion unless his platelets count is less than 20,000 or the patient is actively bleeding. Continue Neupogen until absolute neutrophil count is over 1000. Thank you for taking the care of Jay Watts, I will continue to follow up the patient with you and assist in his management.    LOS: 1 day    Kaylene Dawn K. 10/08/2013

## 2013-10-08 NOTE — Progress Notes (Signed)
Patient ID: Jay Watts, male   DOB: October 05, 1961, 52 y.o.   MRN: [id number] [facility] PROGRESS NOTE  Jay Watts [id number]:[id number] DOB: 1961-07-13 DOA: 10/07/2013 PCP: Jay Pitter, MD  Brief narrative: 52 year old male with past medical history of metastatic non-small cell lung adenocarcinoma initially diagnosed in 11/2012, currently on alimta chemotherapy under Dr. Asa Watts care, last therapy 09/23/2014 who was [facility] from [facility] after his blood work revealed significant pancytopenia. Pt reports feeling short of breath and associated fatigue and weakness. No chest pain, no palpitations. No abdominal pain, nausea or vomiting. No blood in stool or urein. No fever or chills.   This was a direct admission and blood work available from cancer center revealed WBC count of 1.2, hemoglobin of 7 and platelet count of 14.   Assessment and Plan:  Principal Problem:  Adenocarcinoma, lung  - on chemotherapy, last therapy 09/23/2013  - appreciate Jay Watts seeing the pt  - alimta on hold  Active Problems:  Antineoplastic chemotherapy induced pancytopenia  - secondary to recent chemotherapy  - for neutropenia we are giving Neupogen  - 2 units PRBC transfusion 10/20, will transfuse additional unit of PRBC today 10/21 - 1 unit platelet transfusion 10/20 with noted trend upward in platelets over 24 hours Bipolar 1 disorder  - continue Jay Watts  Anemia of chronic disease  - secondary to history of malignancy and sequela of chemotherapy  - 2 units PRBC transfused 10/20 - transfuse additional 1 units PRBC today 10/21 - CBC in AM Neutropenia  - secondary to chemotherapy  - Neupogen 300 mcg daily  Acute renal failure  - secondary to dehydration  - no significant change in Cr over 48 hours  - monitor I's and O's - follow up BMP in am  Hyponatremia  - secondary to dehydration  - continue IV fluids and repeat BMP in AM Hypokalemia - will supplement via PO route with K-dur - repeat  BMP in AM  Code Status: Full  Family Communication: Pt at bedside  Disposition Plan: Remains inpatient   Jay Passey, MD  [facility] Pager [phone]  If 7PM-7AM, please contact night-coverage [email or web address] Password [email or web address] 10/08/2013, 6:50 AM   LOS: 1 day   HPI/Subjective: Pt reports feeling better this AM, denies chest pain or shortness of breath.   Objective: Filed Vitals:   10/08/13 0035 10/08/13 0200 10/08/13 0417 10/08/13 0419  BP: 102/65 100/68 106/82 124/70  Pulse: 95 96 97 76  Temp: 98.8 F (37.1 C) 98.6 F (37 C) 98.2 F (36.8 C) 97.5 F (36.4 C)  TempSrc: Oral Oral    Resp: 16 18 16 18   Height:      Weight:      SpO2: 96% 97% 93% 96%    Intake/Output Summary (Last 24 hours) at 10/08/13 0650 Last data filed at 10/07/13 2315  Gross per 24 hour  Intake  602.5 ml  Output      0 ml  Net  602.5 ml    Exam:   General:  Pt is alert, follows commands appropriately, not in acute distress  Cardiovascular: Regular rate and rhythm, S1/S2, no murmurs, no rubs, no gallops  Respiratory: Clear to auscultation bilaterally, no wheezing, no crackles, no rhonchi  Abdomen: Soft, non tender, non distended, bowel sounds present, no guarding  Extremities: No edema, pulses DP and PT palpable bilaterally  Neuro: Grossly nonfocal  Data Reviewed: Basic Metabolic Panel:  Recent Labs Lab 10/07/13 0856 10/07/13 1245 10/08/13 0425  NA 132* 128* 129*  K 3.7 3.0* 3.3*  CL  --  89* 92*  CO2 24 27 25   GLUCOSE 106 88 78  BUN 12.2 13 12   CREATININE 1.5* 1.50* 1.50*  CALCIUM 9.3 9.2 8.7  MG  --  1.8  --   PHOS  --  3.0  --    Liver Function Tests:  Recent Labs Lab 10/07/13 0856 10/07/13 1245 10/08/13 0425  AST 36* 36 33  ALT 11 11 12   ALKPHOS 70 69 65  BILITOT 0.87 0.8 0.7  PROT 7.5 7.1 6.5  ALBUMIN 3.3* 3.4* 3.0*   CBC:  Recent Labs Lab 10/07/13 0856 10/07/13 1245 10/08/13 0425  WBC 1.2* 0.3* 1.4*  NEUTROABS 0.3* 0.2*  --   HGB 7.0* 6.0*  7.1*  HCT 19.4* 16.4* 19.6*  MCV 97.5 96.5 92.9  PLT 14* 13* 37*   Studies: Portable Chest 1 View  10/07/2013   CLINICAL DATA:  Shortness of breath. History of lung cancer.  EXAM: PORTABLE CHEST - 1 VIEW  COMPARISON:  Chest CT 08/23/2013. Radiographs 02/12/2013.  FINDINGS: 1251 hr. Right suprahilar volume loss and subpleural density have not substantially changed. The right lower lobe paraspinous density seen on CT is not well visualized. There is a stable loculated right pleural effusion following removal of the right pleural catheter. The left lung is clear. The visualized heart size and mediastinal contours are stable. There is no pneumothorax.  IMPRESSION: Grossly stable findings in the right hemi thorax compared with CT of 7 weeks prior. No acute process identified.   Electronically Signed   By: Jay Watts M.D.   On: 10/07/2013 15:11    Scheduled Meds: . filgrastim (NEUPOGEN)  SQ  300 mcg Subcutaneous q1800  . lactose free nutrition  237 mL Oral TID WC  . lamoTRIgine  150 mg Oral QHS  . levofloxacin  750 mg Intravenous Q24H  . mirtazapine  45 mg Oral QHS  . multivitamin with minerals  1 tablet Oral Daily  . potassium chloride  40 mEq Oral Once   Continuous Infusions: . sodium chloride 75 mL/hr at 10/07/13 1311

## 2013-10-08 NOTE — Progress Notes (Signed)
OT Cancellation Note  Patient Details Name: Jay Watts MRN: [id number] DOB: May 20, 1961   Cancelled Treatment:    Reason Eval/Treat Not Completed: Other (comment).  Pt did not feel up to OT today.  He states that before he was admitted yesterday, he was independent and moved often.  Will check back and see if pt feels he has endurance to do what he wants.    Alper Guilmette 10/08/2013, 10:12 AM Marica Otter, OTR/L [phone] 10/08/2013

## 2013-10-08 NOTE — Progress Notes (Signed)
10/08/13 at 2:16pm- The pt is in the follow up phase- post progression.  The pt came in yesterday for labs following his last chemo, and his cbc/diff revealed "significant pancytopenia".  The pt was admitted to the hospital due fatigue and shortness of breath.  The MD felt the pt's pancytopenia was chemo-induced.  The pt has received 2 doses of off-treatment chemotherapy (pemetrexed).  The research nurse reviewed the protocol section on Adverse Event reporting.  Low blood counts are expected events associated with Pemetrexed.  Therefore, per table 5.2.6, no expedited reporting is required for the grade 4 decreased white blood cells and platelet counts.  The pt's anemia is a grade 3 event.

## 2013-10-09 DIAGNOSIS — D61818 Other pancytopenia: Secondary | ICD-10-CM

## 2013-10-09 DIAGNOSIS — K117 Disturbances of salivary secretion: Secondary | ICD-10-CM

## 2013-10-09 DIAGNOSIS — C3430 Malignant neoplasm of lower lobe, unspecified bronchus or lung: Secondary | ICD-10-CM

## 2013-10-09 DIAGNOSIS — R5381 Other malaise: Secondary | ICD-10-CM

## 2013-10-09 LAB — CBC WITH DIFFERENTIAL/PLATELET
Eosinophils Absolute: 0 10*3/uL (ref 0.0–0.7)
Eosinophils Relative: 1 % (ref 0–5)
Lymphocytes Relative: 1 % — ABNORMAL LOW (ref 12–46)
Lymphs Abs: 0 10*3/uL — ABNORMAL LOW (ref 0.7–4.0)
MCHC: 36.3 g/dL — ABNORMAL HIGH (ref 30.0–36.0)
MCV: 92.3 fL (ref 78.0–100.0)
Monocytes Relative: 5 % (ref 3–12)
Platelets: 32 10*3/uL — ABNORMAL LOW (ref 150–400)
RBC: 2.6 MIL/uL — ABNORMAL LOW (ref 4.22–5.81)
RDW: 18.5 % — ABNORMAL HIGH (ref 11.5–15.5)
WBC: 1.7 10*3/uL — ABNORMAL LOW (ref 4.0–10.5)

## 2013-10-09 LAB — TYPE AND SCREEN
Antibody Screen: NEGATIVE
Unit division: 0
Unit division: 0

## 2013-10-09 LAB — BASIC METABOLIC PANEL
BUN: 10 mg/dL (ref 6–23)
CO2: 23 mEq/L (ref 19–32)
Chloride: 97 mEq/L (ref 96–112)
Creatinine, Ser: 1.38 mg/dL — ABNORMAL HIGH (ref 0.50–1.35)
GFR calc Af Amer: 66 mL/min — ABNORMAL LOW (ref >90)
GFR calc non Af Amer: 57 mL/min — ABNORMAL LOW (ref >90)
Potassium: 3.3 mEq/L — ABNORMAL LOW (ref 3.5–5.1)
Sodium: 130 mEq/L — ABNORMAL LOW (ref 135–145)

## 2013-10-09 LAB — GLUCOSE, CAPILLARY: Glucose-Capillary: 79 mg/dL (ref 70–99)

## 2013-10-09 LAB — T4, FREE: Free T4: 0.22 ng/dL — ABNORMAL LOW (ref 0.80–1.80)

## 2013-10-09 MED ORDER — POTASSIUM CHLORIDE IN NACL 40-0.9 MEQ/L-% IV SOLN
INTRAVENOUS | Status: DC
  Administered 2013-10-09 – 2013-10-10 (×2): via INTRAVENOUS
  Filled 2013-10-09 (×2): qty 1000

## 2013-10-09 MED ORDER — TRAZODONE HCL 100 MG PO TABS
100.0000 mg | ORAL_TABLET | Freq: Once | ORAL | Status: DC
  Filled 2013-10-09: qty 1

## 2013-10-09 MED ORDER — LEVOTHYROXINE SODIUM 50 MCG PO TABS
50.0000 ug | ORAL_TABLET | Freq: Every day | ORAL | Status: DC
  Administered 2013-10-10: 50 ug via ORAL
  Filled 2013-10-09 (×3): qty 1

## 2013-10-09 MED ORDER — BIOTENE DRY MOUTH MT LIQD: 15.0000 mL | OROMUCOSAL | Status: DC | PRN

## 2013-10-09 NOTE — Progress Notes (Signed)
Physical Therapy Discharge Patient Details Name: Jay Watts MRN: [id number] DOB: March 24, 1961 Today's Date: 10/09/2013 Time:  1548- 1549    Attempted PT eval this pm. Will discharge pt from PT services secondary to patient has refused 3 (three) consecutive times despite attempts from therapy to work with him. PT will sign off at this time. Please reorder if/when pt is willing to participate with therapy. Thanks.    GP  Rebeca Alert, MPT Pager: [phone]

## 2013-10-09 NOTE — Progress Notes (Signed)
OT Cancellation Note  Patient Details Name: Jay Watts MRN: [id number] DOB: 1961/11/19   Cancelled Treatment:    Reason Eval/Treat Not Completed: Other (comment)  Spoke to RN.  Pt is getting up and managing lines independently.  I spoke to him yesterday, and he didn't feel he needed OT.  Will sign off.    Kairo Laubacher 10/09/2013, 4:21 PM Marica Otter, OTR/L [phone] 10/09/2013

## 2013-10-09 NOTE — Progress Notes (Addendum)
[facility] PROGRESS NOTE  [name]:[id number] DOB: [birth year] DOA: 10/07/2013 PCP: Geraldo Pitter, MD  Brief narrative: Jay Watts is an 52 y.o. male with a PMH of non-small cell lung adenocarcinoma, initially diagnosed 11/2012, currently on alimta chemotherapy, last given on 09/23/2013, who was referred to the hospitalist direct admission from the cancer center secondary to significant pancytopenia. Initial WBC was 1.2, hemoglobin 7, and platelet count 14.  Assessment/Plan: Principal Problem:   Antineoplastic chemotherapy induced pancytopenia -Neupogen started for neutropenia. -Status post 3 units packed red blood cells. -Status post 1 unit of platelets. Active Problems:   Elevated TSH / hypothyroidism -TSH markedly elevated, likely significant hypothyroidism. -Start Synthroid.   Hypokalemia -Potassium added to IV fluids.   Bipolar 2 disorder -Continue Lamictal and Remeron.   Adenocarcinoma, lung -Under the care of Dr. Arbutus Ped.  Status post induction chemotherapy with carboplatin, paclitaxel and Avastin followed by 8 cycles of maintenance Alimta Alimta given 09/23/13.  On hold.   Anemia of chronic disease -Baseline hemoglobin 12 mg/dL. Current hemoglobin lower than usual baseline as a consequence of her recent chemotherapy. -Status post 2 units of packed red blood cells on 10/07/2013 and one additional unit on 10/08/2013 with goal hemoglobin greater than 8 mg/dL.Marland Kitchen   Neutropenia -Continue Neupogen until ANC is greater than 1000.   Acute renal failure -Baseline creatinine appears to be around 0.8. Current creatinine elevated over her usual baseline values.   Hyponatremia -Mild. Improved with IV fluids.   Malnutrition of moderate degree -Seen by dietitian 10/07/2013. Continue supplements. -Already on Remeron, which should act as an appetite stimulant.  Code Status: Full. Family Communication: No family currently at the bedside. Disposition Plan: Home when  stable.   Medical Consultants:  Dr. Si Gaul, Oncology  Other Consultants:  Dietitian  Anti-infectives:  Levaquin 10/07/2013---> 10/09/2013.  HPI/Subjective: Jay Watts feels a bit better. He still has very little appetite. Had loose stools yesterday, but no bowel movement since. No nausea or vomiting.  Objective: Filed Vitals:   10/08/13 1610 10/08/13 2131 10/09/13 0540 10/09/13 0730  BP: 118/82 113/82 114/80   Pulse: 86 96 92   Temp: 98 F (36.7 C) 98 F (36.7 C) 97.7 F (36.5 C)   TempSrc:  Oral Oral   Resp: 18 16 16    Height:      Weight:    73.074 kg (161 lb 1.6 oz)  SpO2:  90% 97%     Intake/Output Summary (Last 24 hours) at 10/09/13 1337 Last data filed at 10/09/13 0335  Gross per 24 hour  Intake   1340 ml  Output      0 ml  Net   1340 ml    Exam: Gen:  NAD Cardiovascular:  RRR, No M/R/G Respiratory:  Lungs CTAB Gastrointestinal:  Abdomen soft, NT/ND, + BS Extremities:  No edema, petechiae/purpura  Data Reviewed: Basic Metabolic Panel:  Recent Labs Lab 10/07/13 0856  10/07/13 1245 10/08/13 0425 10/09/13 0410  NA 132*  --  128* 129* 130*  K 3.7  < > 3.0* 3.3* 3.3*  CL  --   --  89* 92* 97  CO2 24  --  27 25 23   GLUCOSE 106  --  88 78 79  BUN 12.2  --  13 12 10   CREATININE 1.5*  --  1.50* 1.50* 1.38*  CALCIUM 9.3  --  9.2 8.7 8.2*  MG  --   --  1.8  --   --   PHOS  --   --  3.0  --   --   < > = values in this interval not displayed. GFR Estimated Creatinine Clearance: 64.7 ml/min (by C-G formula based on Cr of 1.38). Liver Function Tests:  Recent Labs Lab 10/07/13 0856 10/07/13 1245 10/08/13 0425  AST 36* 36 33  ALT 11 11 12   ALKPHOS 70 69 65  BILITOT 0.87 0.8 0.7  PROT 7.5 7.1 6.5  ALBUMIN 3.3* 3.4* 3.0*   Coagulation profile  Recent Labs Lab 10/07/13 1245  INR 0.94    CBC:  Recent Labs Lab 10/07/13 0856 10/07/13 1245 10/08/13 0425 10/09/13 0410  WBC 1.2* 0.3* 1.4* 1.7*  NEUTROABS 0.3* 0.2*  --   1.6*  HGB 7.0* 6.0* 7.1* 8.7*  HCT 19.4* 16.4* 19.6* 24.0*  MCV 97.5 96.5 92.9 92.3  PLT 14* 13* 37* 32*   CBG:  Recent Labs Lab 10/08/13 0738 10/09/13 0737  GLUCAP 107* 79   Thyroid function studies  Recent Labs  10/07/13 1245  TSH 84.065*    Procedures and Diagnostic Studies: Portable Chest 1 View  10/07/2013   CLINICAL DATA:  Shortness of breath. History of lung cancer.  EXAM: PORTABLE CHEST - 1 VIEW  COMPARISON:  Chest CT 08/23/2013. Radiographs 02/12/2013.  FINDINGS: 1251 hr. Right suprahilar volume loss and subpleural density have not substantially changed. The right lower lobe paraspinous density seen on CT is not well visualized. There is a stable loculated right pleural effusion following removal of the right pleural catheter. The left lung is clear. The visualized heart size and mediastinal contours are stable. There is no pneumothorax.  IMPRESSION: Grossly stable findings in the right hemi thorax compared with CT of 7 weeks prior. No acute process identified.   Electronically Signed   By: Roxy Horseman M.D.   On: 10/07/2013 15:11    Scheduled Meds: . lactose free nutrition  237 mL Oral TID WC  . lamoTRIgine  150 mg Oral QHS  . levofloxacin  750 mg Intravenous Q24H  . mirtazapine  45 mg Oral QHS  . multivitamin with minerals  1 tablet Oral Daily   Continuous Infusions: . sodium chloride 75 mL/hr at 10/09/13 0747    Time spent: 35 minutes with > 50% of time discussing current diagnostic test results, clinical impression and plan of care.    LOS: 2 days   [name]  [facility] Pager [phone].   *Please note that the hospitalists switch teams on Wednesdays. Please call the flow manager at [phone] if you are having difficulty reaching the hospitalist taking care of this patient as she can update you and provide the most up-to-date pager number of provider caring for the patient. If 8PM-8AM, please contact night-coverage at [email or web address], password  [email or web address]  10/09/2013, 1:37 PM      In an effort to keep you and your family informed about your hospital stay, I am providing you with this information sheet. If you or your family have any questions, please do not hesitate to have the nursing staff page me to set up a meeting time.  Yovanny Coats 10/09/2013 2 (Number of days in the hospital)  Treatment team:  Dr. Hillery Aldo, Hospitalist (Internist)  Dr. Si Gaul, oncologist  Active Treatment Issues with Plan: Principal Problem:   Low blood counts from chemotherapy -Neupogen started for neutropenia. -Status post 3 units packed red blood cells. -Status post 1 unit of platelets. Active Problems:   Under active thyroid gland -TSH markedly elevated, likely significant hypothyroidism. -Start Synthroid.   Low potassium -  Potassium added to IV fluids.   Bipolar 1 disorder -Continue Lamictal.   Lung cancer -Under the care of Dr. Arbutus Ped.  Status post induction chemotherapy with carboplatin, paclitaxel and Avastin followed by 8 cycles of maintenance Alimta Alimta given 09/23/13.  On hold.   Anemia of chronic disease -Baseline hemoglobin 12 mg/dL. Current hemoglobin lower than usual baseline as a consequence of recent chemotherapy. -Status post 2 units of packed red blood cells on 10/07/2013 and one additional unit on 10/08/2013 with goal hemoglobin greater than 8 mg/dL.Marland Kitchen   Neutropenia -Continue Neupogen until ANC is greater than 1000.   Kidney insufficiency -Baseline creatinine appears to be around 0.8. Current creatinine elevated over her usual baseline values.   Low sodium level -Mild. Improved with IV fluids.   Malnutrition of moderate degree -Seen by dietitian 10/07/2013. Continue supplements.  Significant Lab results:  10/07/13 1245  TSH 84.065*  (Normal 0.350-4.500) Lab 10/07/13 0856 10/07/13 1245 10/08/13 0425 10/09/13 0410  WBC 1.2* 0.3* 1.4* 1.7*  NEUTROABS 0.3* 0.2*  --  1.6*  HGB 7.0* 6.0* 7.1*  8.7*  HCT 19.4* 16.4* 19.6* 24.0*  MCV 97.5 96.5 92.9 92.3  PLT 14* 13* 37* 32*   Lab 10/07/13 0856  10/07/13 1245 10/08/13 0425 10/09/13 0410  NA 132*  --  128* 129* 130*  K 3.7  < > 3.0* 3.3* 3.3*  CL  --   --  89* 92* 97  CO2 24  --  27 25 23   GLUCOSE 106  --  88 78 79  BUN 12.2  --  13 12 10   CREATININE 1.5*  --  1.50* 1.50* 1.38*  CALCIUM 9.3  --  9.2 8.7 8.2*  MG  --   --  1.8  --   --   PHOS  --   --  3.0  --   --    Significant diagnostic study results: Chest x-ray 10/07/2013: Stable.  Anticipated discharge date: 1 day.

## 2013-10-09 NOTE — Progress Notes (Signed)
Subjective: Jay Watts is seen and examined today. He is feeling much better this morning after receiving 2 units of PRBCs transfusion on 10/20  as well as one unit of platelets. He was started on treatment with Neupogen and his white blood count improved. He denied having any significant fever or chills. He has no nausea or vomiting.  Main complain is dry mouth, bat taste per patient report.   Objective: Vital signs in last 24 hours: Temp:  [97.7 F (36.5 C)-98.2 F (36.8 C)] 97.7 F (36.5 C) (10/22 0540) Pulse Rate:  [86-96] 92 (10/22 0540) Resp:  [16-20] 16 (10/22 0540) BP: (108-124)/(62-82) 114/80 mmHg (10/22 0540) SpO2:  [90 %-97 %] 97 % (10/22 0540) Weight:  [161 lb 1.6 oz (73.074 kg)] 161 lb 1.6 oz (73.074 kg) (10/22 0730)  Intake/Output from previous day: 10/21 0701 - 10/22 0700 In: 1962 [P.O.:1400; I.V.:250; Blood:312] Out: -  Intake/Output this shift:    General appearance: alert, cooperative, fatigued and no distress Mouth: dry, no visible open lesions or thrush.  Resp: clear to auscultation bilaterally Cardio: regular rate and rhythm, S1, S2 normal, no murmur, click, rub or gallop GI: soft, non-tender; bowel sounds normal; no masses,  no organomegaly Extremities: extremities normal, atraumatic, no cyanosis or edema  Lab Results:   Recent Labs  10/08/13 0425 10/09/13 0410  WBC 1.4* 1.7*  HGB 7.1* 8.7*  HCT 19.6* 24.0*  PLT 37* 32*   BMET  Recent Labs  10/08/13 0425 10/09/13 0410  NA 129* 130*  K 3.3* 3.3*  CL 92* 97  CO2 25 23  GLUCOSE 78 79  BUN 12 10  CREATININE 1.50* 1.38*  CALCIUM 8.7 8.2*    Studies/Results: Portable Chest 1 View  10/07/2013   CLINICAL DATA:  Shortness of breath. History of lung cancer.  EXAM: PORTABLE CHEST - 1 VIEW  COMPARISON:  Chest CT 08/23/2013. Radiographs 02/12/2013.  FINDINGS: 1251 hr. Right suprahilar volume loss and subpleural density have not substantially changed. The right lower lobe paraspinous density seen on  CT is not well visualized. There is a stable loculated right pleural effusion following removal of the right pleural catheter. The left lung is clear. The visualized heart size and mediastinal contours are stable. There is no pneumothorax.  IMPRESSION: Grossly stable findings in the right hemi thorax compared with CT of 7 weeks prior. No acute process identified.   Electronically Signed   By: Roxy Horseman M.D.   On: 10/07/2013 15:11    Medications: I have reviewed the patient's current medications.  Assessment/Plan: 1) metastatic non-small cell lung cancer, adenocarcinoma status post induction chemotherapy with carboplatin, paclitaxel and Avastin followed by 8 cycles of maintenance Alimta.His last treatment was complicated with severe pancytopenia. Changing his treatment after discharge id being considered. 2) pancytopenia: S/p  one more unit of PRBCs on 10/21, total 3 units to keep his hemoglobin above 8.0 G./DL. The patient received 1 unit of platelets and no need for platelet transfusion unless his platelets count is less than 20,000 or the patient is actively bleeding. Continue Neupogen until absolute neutrophil count is over 1000. 3. Dry mouth, patient requests biotene as needed (he takes it at home) Other medical issues as per admitting team.POssible discharge in one more day if stable and counts remain adequate.  Thank you for taking the care of Mr. Jay Watts, I will continue to follow up the patient with you and assist in his management.    LOS: 2 days    [facility] 10/09/2013  ADDENDUM: Hematology/Oncology Attending: The patient is seen and examined today. I agree with the above note. He is feeling much better today but continues to have low total white blood count and the patient may require Neupogen for another day or 2 before discharge from the hospital. He continues to have mild fatigue. He denied having any significant fever or chills. He is eating a little bit better but  continues to have lack of taste. If he continues to improve he may be discharged home in the next 1-2 days.

## 2013-10-10 ENCOUNTER — Encounter (HOSPITAL_COMMUNITY): Payer: Self-pay | Admitting: Internal Medicine

## 2013-10-10 DIAGNOSIS — E039 Hypothyroidism, unspecified: Secondary | ICD-10-CM

## 2013-10-10 HISTORY — DX: Hypothyroidism, unspecified: E03.9

## 2013-10-10 LAB — BASIC METABOLIC PANEL
CO2: 22 mEq/L (ref 19–32)
Calcium: 8.3 mg/dL — ABNORMAL LOW (ref 8.4–10.5)
Creatinine, Ser: 1.47 mg/dL — ABNORMAL HIGH (ref 0.50–1.35)
GFR calc non Af Amer: 53 mL/min — ABNORMAL LOW (ref >90)
Glucose, Bld: 77 mg/dL (ref 70–99)
Potassium: 3.6 mEq/L (ref 3.5–5.1)
Sodium: 134 mEq/L — ABNORMAL LOW (ref 135–145)

## 2013-10-10 LAB — CBC
MCH: 33.1 pg (ref 26.0–34.0)
MCV: 94 fL (ref 78.0–100.0)
Platelets: 31 10*3/uL — ABNORMAL LOW (ref 150–400)
RDW: 18.5 % — ABNORMAL HIGH (ref 11.5–15.5)
WBC: 3 10*3/uL — ABNORMAL LOW (ref 4.0–10.5)

## 2013-10-10 MED ORDER — TRAZODONE HCL 100 MG PO TABS: 100.0000 mg | ORAL_TABLET | Freq: Every evening | ORAL | Status: DC | PRN

## 2013-10-10 MED ORDER — POLYETHYLENE GLYCOL 3350 17 GM/SCOOP PO POWD: 17.0000 g | Freq: Every day | ORAL | Status: AC

## 2013-10-10 MED ORDER — LEVOTHYROXINE SODIUM 50 MCG PO TABS: 50.0000 ug | ORAL_TABLET | Freq: Every day | ORAL | Status: DC

## 2013-10-10 NOTE — Discharge Summary (Signed)
Physician Discharge Summary  Jay Watts ZOX:[id number] DOB: [birth year] DOA: 10/07/2013  PCP: Geraldo Pitter, MD  Admit date: 10/07/2013 Discharge date: 10/10/2013  Recommendations for Outpatient Follow-up:  1. Patient has newly diagnosed hypothyroidism. Started on Synthroid. Will need recheck of TSH in 4-X. weeks with dosage adjustment if needed. 2. Monitor renal function closely as his current creatinine remains elevated over her usual baseline values.  Discharge Diagnoses:  Principal Problem:    Antineoplastic chemotherapy induced pancytopenia Active Problems:    Bipolar 2 disorder    Adenocarcinoma, lung    Anemia of chronic disease    Neutropenia    Acute renal failure    Hyponatremia    Malnutrition of moderate degree    Unspecified hypothyroidism   Discharge Condition: Improved.  Diet recommendation: Regular.  History of present illness:  Jay Watts is an 52 y.o. male with a PMH of non-small cell lung adenocarcinoma, initially diagnosed 11/2012, currently on alimta chemotherapy, last given on 09/23/2013, who was referred to the hospitalist direct admission from the cancer center secondary to significant pancytopenia. Initial WBC was 1.2, hemoglobin 7, and platelet count 14.  Hospital Course by problem:  Principal Problem:  Antineoplastic chemotherapy induced pancytopenia  -Treated with Neupogen until ANC greater than 1000.  -Status post 3 units packed red blood cells.  -Status post 1 unit of platelets.  -Counts stable at discharge. Active Problems:  Elevated TSH / hypothyroidism  -TSH markedly elevated, free T4 low at 0.22, newly diagnosed hypothyroidism.  -Continue 50 mcg a Synthroid daily. Will need followup TSH in 6 weeks.  Hypokalemia  -Corrected with potassium added to IV fluids.  Bipolar 2 disorder  -Continue Lamictal and Remeron.  Adenocarcinoma, lung  -Under the care of Dr. Arbutus Ped. Status post induction chemotherapy with carboplatin,  paclitaxel and Avastin followed by 8 cycles of maintenance Alimta  -Alimta given 09/23/13. On hold.  -Followup with Dr. Arbutus Ped. Anemia of chronic disease  -Baseline hemoglobin 12 mg/dL. Current hemoglobin lower than usual baseline as a consequence of her recent chemotherapy.  -Status post 2 units of packed red blood cells on 10/07/2013 and one additional unit on 10/08/2013 with goal hemoglobin greater than 8 mg/dL.  Neutropenia  -Neupogen discontinued 10/09/2013.  Acute renal failure  -Baseline creatinine appears to be around 0.8. Current creatinine elevated over her usual baseline values.  Hyponatremia  -Mild. Improved with IV fluids.  Malnutrition of moderate degree  -Seen by dietitian 10/07/2013. Continue supplements.  -Already on Remeron, which should act as an appetite stimulant.  Consultations:  Dr. Si Gaul, Oncology  Discharge Exam: Filed Vitals:   10/10/13 0534  BP: 124/81  Pulse: 96  Temp: 97.7 F (36.5 C)  Resp: 20   Filed Vitals:   10/09/13 1434 10/09/13 2155 10/09/13 2209 10/10/13 0534  BP: 119/83  118/89 124/81  Pulse: 95 90 96 96  Temp: 97.5 F (36.4 C)  97.7 F (36.5 C) 97.7 F (36.5 C)  TempSrc: Oral  Oral Axillary  Resp: 16  20 20   Height:      Weight:    73.664 kg (162 lb 6.4 oz)  SpO2: 93%  91%     Gen:  NAD Cardiovascular:  RRR, No M/R/G Respiratory: Lungs CTAB Gastrointestinal: Abdomen soft, NT/ND with normal active bowel sounds. Extremities:  Petechiae and purpura to the lower extremities   Discharge Instructions  Discharge Orders   Future Appointments Provider Department Dept Phone   10/14/2013 8:45 AM Krista Blue [facility] [facility] [phone]  10/14/2013 9:15 AM Si Gaul, MD [facility] [phone]   10/14/2013 10:00 AM [facility] [phone]   Future Orders Complete By Expires   Call MD for:  extreme fatigue   As directed    Call MD for:  persistant nausea and vomiting  As directed    Call MD for:  severe uncontrolled pain  As directed    Call MD for:  temperature >100.4  As directed    Diet general  As directed    Discharge instructions  As directed    Comments:     Your PCP will need to check your TSH in 4-6 weeks to ensure your Synthroid dose is adequate.  You were cared for by Dr. Hillery Aldo  (a hospitalist) during your hospital stay. If you have any questions about your discharge medications or the care you received while you were in the hospital after you are discharged, you can call the unit and ask to speak with the hospitalist on call if the hospitalist that took care of you is not available. Once you are discharged, your primary care physician will handle any further medical issues. Please note that NO REFILLS for any discharge medications will be authorized once you are discharged, as it is imperative that you return to your primary care physician (or establish a relationship with a primary care physician if you do not have one) for your aftercare needs so that they can reassess your need for medications and monitor your lab values.  Any outstanding tests can be reviewed by your PCP at your follow up visit.  It is also important to review any medicine changes with your PCP.  Please bring these d/c instructions with you to your next visit so your physician can review these changes with you.  If you do not have a primary care physician, you can call [phone] for a physician referral.  It is highly recommended that you obtain a PCP for hospital follow up.   Increase activity slowly  As directed        Medication List         lamoTRIgine 150 MG tablet  Commonly known as:  LAMICTAL  Take 150 mg by mouth at bedtime.     levothyroxine 50 MCG tablet  Commonly known as:  SYNTHROID, LEVOTHROID  Take 1 tablet (50 mcg total) by mouth daily before breakfast.     mirtazapine 45 MG tablet   Commonly known as:  REMERON  Take 45 mg by mouth at bedtime.     multivitamin with minerals Tabs tablet  Take 1 tablet by mouth daily.     polyethylene glycol powder powder  Commonly known as:  MIRALAX  Take 17 g by mouth daily.     PRESCRIPTION MEDICATION  Inject into the vein every 21 ( twenty-one) days. Alimta infusion q3weeks.     traZODone 100 MG tablet  Commonly known as:  DESYREL  Take 1 tablet (100 mg total) by mouth at bedtime as needed for sleep.           Follow-up Information   Follow up with Geraldo Pitter, MD. Schedule an appointment as soon as possible for a visit in 4 weeks. (For hospital followup your newly diagnosed hypothyroidism)    Specialty:  Family Medicine   Contact information:   [address] [place] [address] [phone]       Follow up with [facility] K.,  MD. (At your appointment time as noted below)    Specialty:  Oncology   Contact information:   [address] [place] [address] [phone]        The results of significant diagnostics from this hospitalization (including imaging, microbiology, ancillary and laboratory) are listed below for reference.    Significant Diagnostic Studies: Portable Chest 1 View  10/07/2013   CLINICAL DATA:  Shortness of breath. History of lung cancer.  EXAM: PORTABLE CHEST - 1 VIEW  COMPARISON:  Chest CT 08/23/2013. Radiographs 02/12/2013.  FINDINGS: 1251 hr. Right suprahilar volume loss and subpleural density have not substantially changed. The right lower lobe paraspinous density seen on CT is not well visualized. There is a stable loculated right pleural effusion following removal of the right pleural catheter. The left lung is clear. The visualized heart size and mediastinal contours are stable. There is no pneumothorax.  IMPRESSION: Grossly stable findings in the right hemi thorax compared with CT of 7 weeks prior. No acute process identified.   Electronically Signed   By: Roxy Horseman M.D.   On: 10/07/2013 15:11    Labs:  Basic Metabolic Panel:  Recent Labs Lab 10/07/13 0856  10/07/13 1245 10/08/13 0425 10/09/13 0410 10/10/13 0405  NA 132*  --  128* 129* 130* 134*  K 3.7  < > 3.0* 3.3* 3.3* 3.6  CL  --   --  89* 92* 97 101  CO2 24  --  27 25 23 22   GLUCOSE 106  --  88 78 79 77  BUN 12.2  --  13 12 10 9   CREATININE 1.5*  --  1.50* 1.50* 1.38* 1.47*  CALCIUM 9.3  --  9.2 8.7 8.2* 8.3*  MG  --   --  1.8  --   --   --   PHOS  --   --  3.0  --   --   --   < > = values in this interval not displayed. GFR Estimated Creatinine Clearance: 61.3 ml/min (by C-G formula based on Cr of 1.47). Liver Function Tests:  Recent Labs Lab 10/07/13 0856 10/07/13 1245 10/08/13 0425  AST 36* 36 33  ALT 11 11 12   ALKPHOS 70 69 65  BILITOT 0.87 0.8 0.7  PROT 7.5 7.1 6.5  ALBUMIN 3.3* 3.4* 3.0*   Coagulation profile  Recent Labs Lab 10/07/13 1245  INR 0.94    CBC:  Recent Labs Lab 10/07/13 0856 10/07/13 1245 10/08/13 0425 10/09/13 0410 10/10/13 0405  WBC 1.2* 0.3* 1.4* 1.7* 3.0*  NEUTROABS 0.3* 0.2*  --  1.6*  --   HGB 7.0* 6.0* 7.1* 8.7* 8.8*  HCT 19.4* 16.4* 19.6* 24.0* 25.0*  MCV 97.5 96.5 92.9 92.3 94.0  PLT 14* 13* 37* 32* 31*   CBG:  Recent Labs Lab 10/08/13 0738 10/09/13 0737  GLUCAP 107* 79   Thyroid function studies  Recent Labs  10/07/13 1245  TSH 84.065*    Time coordinating discharge: 35 minutes.  Signed:  Dorena Dorfman  Pager [phone] [facility] 10/10/2013, 10:11 AM

## 2013-10-14 ENCOUNTER — Encounter: Payer: Self-pay | Admitting: Internal Medicine

## 2013-10-14 ENCOUNTER — Telehealth: Payer: Self-pay | Admitting: Internal Medicine

## 2013-10-14 ENCOUNTER — Other Ambulatory Visit (HOSPITAL_BASED_OUTPATIENT_CLINIC_OR_DEPARTMENT_OTHER): Payer: Medicaid Other | Admitting: Lab

## 2013-10-14 ENCOUNTER — Ambulatory Visit (HOSPITAL_BASED_OUTPATIENT_CLINIC_OR_DEPARTMENT_OTHER): Payer: Medicaid Other | Admitting: Internal Medicine

## 2013-10-14 ENCOUNTER — Other Ambulatory Visit: Payer: Self-pay | Admitting: Medical Oncology

## 2013-10-14 ENCOUNTER — Encounter: Payer: Self-pay | Admitting: Medical Oncology

## 2013-10-14 ENCOUNTER — Ambulatory Visit: Payer: Self-pay

## 2013-10-14 VITALS — BP 128/91 | HR 91 | Temp 97.4°F | Resp 18 | Ht 74.0 in | Wt 168.5 lb

## 2013-10-14 DIAGNOSIS — L539 Erythematous condition, unspecified: Secondary | ICD-10-CM

## 2013-10-14 DIAGNOSIS — C3430 Malignant neoplasm of lower lobe, unspecified bronchus or lung: Secondary | ICD-10-CM

## 2013-10-14 DIAGNOSIS — C3410 Malignant neoplasm of upper lobe, unspecified bronchus or lung: Secondary | ICD-10-CM

## 2013-10-14 DIAGNOSIS — C3490 Malignant neoplasm of unspecified part of unspecified bronchus or lung: Secondary | ICD-10-CM

## 2013-10-14 DIAGNOSIS — R141 Gas pain: Secondary | ICD-10-CM

## 2013-10-14 DIAGNOSIS — C3491 Malignant neoplasm of unspecified part of right bronchus or lung: Secondary | ICD-10-CM

## 2013-10-14 DIAGNOSIS — R5381 Other malaise: Secondary | ICD-10-CM

## 2013-10-14 LAB — CBC WITH DIFFERENTIAL/PLATELET
Basophils Absolute: 0 10*3/uL (ref 0.0–0.1)
EOS%: 0.3 % (ref 0.0–7.0)
Eosinophils Absolute: 0 10*3/uL (ref 0.0–0.5)
HCT: 28.7 % — ABNORMAL LOW (ref 38.4–49.9)
LYMPH%: 24.2 % (ref 14.0–49.0)
MCH: 33.7 pg — ABNORMAL HIGH (ref 27.2–33.4)
MCHC: 35.2 g/dL (ref 32.0–36.0)
MCV: 95.7 fL (ref 79.3–98.0)
MONO%: 7.7 % (ref 0.0–14.0)
NEUT%: 67.7 % (ref 39.0–75.0)
RBC: 3 10*6/uL — ABNORMAL LOW (ref 4.20–5.82)
RDW: 17.9 % — ABNORMAL HIGH (ref 11.0–14.6)

## 2013-10-14 LAB — COMPREHENSIVE METABOLIC PANEL (CC13)
AST: 47 U/L — ABNORMAL HIGH (ref 5–34)
Albumin: 3.4 g/dL — ABNORMAL LOW (ref 3.5–5.0)
Alkaline Phosphatase: 90 U/L (ref 40–150)
Anion Gap: 12 mEq/L — ABNORMAL HIGH (ref 3–11)
BUN: 7.9 mg/dL (ref 7.0–26.0)
Glucose: 84 mg/dl (ref 70–140)
Potassium: 3.5 mEq/L (ref 3.5–5.1)
Sodium: 139 mEq/L (ref 136–145)
Total Bilirubin: 0.35 mg/dL (ref 0.20–1.20)

## 2013-10-14 NOTE — Patient Instructions (Signed)
Followup visit in 2 weeks with repeat CT scan of the chest, abdomen and pelvis.

## 2013-10-14 NOTE — Progress Notes (Signed)
[facility] [phone]   [phone]  OFFICE PROGRESS NOTE  Geraldo Pitter, MD [address] [place] [address]  DIAGNOSIS: Metastatic non-small cell lung cancer, adenocarcinoma with negative EGFR mutation and negative ALK gene translocation diagnosed in December of 2013.   PRIOR THERAPY:  1. Status post Pleurx catheter placement for drainage of right-sided pleural effusion under the care of Dr. Laneta Simmers on 11/29/2012.  2. Systemic chemotherapy with carboplatin for AUC of 6, paclitaxel 200 mg/M2 and Avastin 15 mg/kg every 3 weeks according to the ECoG protocol 5508. Status post 4 cycles.  3. Maintenance chemotherapy according to the ECoG 5508 protocol, patient has been randomized to pemetrexed 500 mg per meter squared given every 3 weeks. Status post 6 cycles.  CURRENT THERAPY: Maintenance chemotherapy with single agent Alimta at 500 mg per meter squared given every 3 weeks status post 2 cycles. Last dose was given 09/16/2013 discontinued today secondary to intolerance.   CHEMOTHERAPY INTENT: Palliative  CURRENT # OF CHEMOTHERAPY CYCLES: 8  CURRENT ANTIEMETICS: Zofran, dexamethasone and Compazine  CURRENT SMOKING STATUS: Former smoker quit 11/25/2012  ORAL CHEMOTHERAPY AND CONSENT: None  CURRENT BISPHOSPHONATES USE: None  PAIN MANAGEMENT: 0/10  NARCOTICS INDUCED CONSTIPATION: None  LIVING WILL AND CODE STATUS: No CODE BLUE   INTERVAL HISTORY: Jay Watts 52 y.o. male returns to the clinic today for followup visit accompanied by his mother. The patient has been complaining of increasing fatigue and weakness recently as well as abdominal distention and pain. He was recently admitted to [facility] with pancytopenia and dehydration. He received 2 units of PRBCs transfusion in addition to one unit of platelets. He has improvement in his hemoglobin and hematocrit as well as platelets count. He continues to have erythema in the  lower extremities secondary to venous stasis and petechiae. He denied having any current nausea or vomiting and no change in bowel movement. The patient denied having any significant chest pain, shortness of breath, cough or hemoptysis. He is here today for evaluation and discussion of his treatment options.  MEDICAL HISTORY: Past Medical History  Diagnosis Date  . Bipolar 2 disorder   . Shortness of breath     with exertion  . Pneumothorax     right side- spring 2013  . Lung cancer   . Adenocarcinoma, lung 11/30/2012  . Unspecified hypothyroidism 10/10/2013  . Malnutrition of moderate degree 10/08/2013    ALLERGIES:  has No Known Allergies.  MEDICATIONS:  Current Outpatient Prescriptions  Medication Sig Dispense Refill  . lamoTRIgine (LAMICTAL) 150 MG tablet Take 150 mg by mouth at bedtime.      Marland Kitchen levothyroxine (SYNTHROID, LEVOTHROID) 50 MCG tablet Take 1 tablet (50 mcg total) by mouth daily before breakfast.  30 tablet  2  . mirtazapine (REMERON) 45 MG tablet Take 45 mg by mouth at bedtime.      . Multiple Vitamin (MULTIVITAMIN WITH MINERALS) TABS Take 1 tablet by mouth daily.      . polyethylene glycol powder (MIRALAX) powder Take 17 g by mouth daily.  255 g  0  . PRESCRIPTION MEDICATION Inject into the vein every 21 ( twenty-one) days. Alimta infusion q3weeks.      . traZODone (DESYREL) 100 MG tablet Take 1 tablet (100 mg total) by mouth at bedtime as needed for sleep.  30 tablet  2   No current facility-administered medications for this visit.    SURGICAL HISTORY:  Past Surgical History  Procedure Laterality Date  . Turbinate reduction    . Chest tube insertion  11/29/2012    Procedure: INSERTION PLEURAL DRAINAGE CATHETER;  Surgeon: [name], MD;  Location: [facility] OR;  Service: Thoracic;  Laterality: Right;  . Removal of pleural drainage catheter Right 02/14/2013    Procedure: REMOVAL OF PLEURAL DRAINAGE CATHETER;  Surgeon: [name], MD;  Location: [facility] OR;  Service:  Thoracic;  Laterality: Right;    REVIEW OF SYSTEMS:  Constitutional: positive for fatigue Eyes: negative Ears, nose, mouth, throat, and face: negative Respiratory: positive for dyspnea on exertion Cardiovascular: negative Gastrointestinal: negative Genitourinary:negative Integument/breast: negative Hematologic/lymphatic: positive for petechiae Musculoskeletal:negative Neurological: negative Behavioral/Psych: negative Endocrine: negative Allergic/Immunologic: negative   PHYSICAL EXAMINATION: General appearance: alert, cooperative, fatigued and no distress Head: Normocephalic, without obvious abnormality, atraumatic Neck: no adenopathy, no JVD, supple, symmetrical, trachea midline and thyroid not enlarged, symmetric, no tenderness/mass/nodules Lymph nodes: Cervical, supraclavicular, and axillary nodes normal. Resp: clear to auscultation bilaterally Back: symmetric, no curvature. ROM normal. No CVA tenderness. Cardio: regular rate and rhythm, S1, S2 normal, no murmur, click, rub or gallop GI: soft, non-tender; bowel sounds normal; no masses,  no organomegaly Extremities: extremities 1+ edema with redness secondary to venous stasis. Neurologic: Alert and oriented X 3, normal strength and tone. Normal symmetric reflexes. Normal coordination and gait  ECOG PERFORMANCE STATUS: 1 - Symptomatic but completely ambulatory  Blood pressure 128/91, pulse 91, temperature 97.4 F (36.3 C), temperature source Oral, resp. rate 18, height 6\' 2"  (1.88 m), weight 168 lb 8 oz (76.431 kg).  LABORATORY DATA: Lab Results  Component Value Date   WBC 7.4 10/14/2013   HGB 10.1* 10/14/2013   HCT 28.7* 10/14/2013   MCV 95.7 10/14/2013   PLT 69* 10/14/2013      Chemistry      Component Value Date/Time   NA 139 10/14/2013 0840   NA 134* 10/10/2013 0405   K 3.5 10/14/2013 0840   K 3.6 10/10/2013 0405   CL 101 10/10/2013 0405   CL 98 06/03/2013 0914   CO2 24 10/14/2013 0840   CO2 22 10/10/2013  0405   BUN 7.9 10/14/2013 0840   BUN 9 10/10/2013 0405   CREATININE 1.7* 10/14/2013 0840   CREATININE 1.47* 10/10/2013 0405      Component Value Date/Time   CALCIUM 9.5 10/14/2013 0840   CALCIUM 8.3* 10/10/2013 0405   ALKPHOS 90 10/14/2013 0840   ALKPHOS 65 10/08/2013 0425   AST 47* 10/14/2013 0840   AST 33 10/08/2013 0425   ALT 18 10/14/2013 0840   ALT 12 10/08/2013 0425   BILITOT 0.35 10/14/2013 0840   BILITOT 0.7 10/08/2013 0425       RADIOGRAPHIC STUDIES: Portable Chest 1 View  10/07/2013   CLINICAL DATA:  Shortness of breath. History of lung cancer.  EXAM: PORTABLE CHEST - 1 VIEW  COMPARISON:  Chest CT 08/23/2013. Radiographs 02/12/2013.  FINDINGS: 1251 hr. Right suprahilar volume loss and subpleural density have not substantially changed. The right lower lobe paraspinous density seen on CT is not well visualized. There is a stable loculated right pleural effusion following removal of the right pleural catheter. The left lung is clear. The visualized heart size and mediastinal contours are stable. There is no pneumothorax.  IMPRESSION: Grossly stable findings in the right hemi thorax compared with CT of 7 weeks prior. No acute process identified.   Electronically Signed   By: Roxy Horseman M.D.   On: 10/07/2013 15:11    ASSESSMENT  AND PLAN: This is a very pleasant 53 years old white male with metastatic non-small cell lung cancer, adenocarcinoma status post 6 cycles of induction chemotherapy was carboplatin, paclitaxel and Avastin followed by 8 cycles of maintenance Avastin discontinued recently secondary tointolerance with significant pancytopenia. The patient also has significant abdominal distention that is new compared to few weeks ago. I have a lengthy discussion with the patient and his mother today about his current condition. I recommended for him to have repeat CT scan of the chest, abdomen and pelvis for restaging of his disease. I would see the patient back for followup  visit in less than 2 weeks for evaluation and discussion of his scan results and treatment options. I may consider the patient for treatment with oral Tarceva or involvement in the immunotherapy clinical trial with Nivolumab if he is eligible. The patient is currently working on disability paper and I wrote him a letter today supporting his case. He was advised to call immediately if he has concerning symptoms in the interval. The patient voices understanding of current disease status and treatment options and is in agreement with the current care plan.  All questions were answered. The patient knows to call the clinic with any problems, questions or concerns. We can certainly see the patient much sooner if necessary.  I spent 20 minutes counseling the patient face to face. The total time spent in the appointment was 30 minutes.

## 2013-10-14 NOTE — Telephone Encounter (Signed)
Jay Watts pt appt for lab,Ct and MD for November , cancelled chemo today

## 2013-10-14 NOTE — Progress Notes (Signed)
The pt was in the Cancer Center this am for his lab, md, and chemo appts. I met with the pt and gave him his questionnaires (PRO's) upon arrival at the cancer center. The pt completed his PRO's ( first the EQ-5D-3L then the LCSS booklet). I, the Research Assistant, then reviewed and checked the PRO's for completeness. The pt was thanked for his willingness to meet with me and for his continued participation in the clinical trial.  °

## 2013-10-23 ENCOUNTER — Ambulatory Visit (HOSPITAL_COMMUNITY)
Admission: RE | Admit: 2013-10-23 | Discharge: 2013-10-23 | Disposition: A | Discharge status: Home / Self Care | Payer: Medicaid Other | Source: Ambulatory Visit | Attending: Internal Medicine | Admitting: Internal Medicine

## 2013-10-23 ENCOUNTER — Other Ambulatory Visit (HOSPITAL_BASED_OUTPATIENT_CLINIC_OR_DEPARTMENT_OTHER): Payer: Medicaid Other | Admitting: Lab

## 2013-10-23 ENCOUNTER — Encounter (HOSPITAL_COMMUNITY): Payer: Self-pay

## 2013-10-23 DIAGNOSIS — J90 Pleural effusion, not elsewhere classified: Secondary | ICD-10-CM | POA: Insufficient documentation

## 2013-10-23 DIAGNOSIS — C3430 Malignant neoplasm of lower lobe, unspecified bronchus or lung: Secondary | ICD-10-CM

## 2013-10-23 DIAGNOSIS — C3491 Malignant neoplasm of unspecified part of right bronchus or lung: Secondary | ICD-10-CM

## 2013-10-23 DIAGNOSIS — I7090 Unspecified atherosclerosis: Secondary | ICD-10-CM | POA: Insufficient documentation

## 2013-10-23 DIAGNOSIS — J439 Emphysema, unspecified: Secondary | ICD-10-CM | POA: Insufficient documentation

## 2013-10-23 DIAGNOSIS — R112 Nausea with vomiting, unspecified: Secondary | ICD-10-CM | POA: Insufficient documentation

## 2013-10-23 DIAGNOSIS — R188 Other ascites: Secondary | ICD-10-CM | POA: Insufficient documentation

## 2013-10-23 DIAGNOSIS — C3490 Malignant neoplasm of unspecified part of unspecified bronchus or lung: Secondary | ICD-10-CM | POA: Insufficient documentation

## 2013-10-23 DIAGNOSIS — K5989 Other specified functional intestinal disorders: Secondary | ICD-10-CM | POA: Insufficient documentation

## 2013-10-23 DIAGNOSIS — Z79899 Other long term (current) drug therapy: Secondary | ICD-10-CM | POA: Insufficient documentation

## 2013-10-23 LAB — COMPREHENSIVE METABOLIC PANEL (CC13)
ALT: 12 U/L (ref 0–55)
Alkaline Phosphatase: 105 U/L (ref 40–150)
Anion Gap: 15 mEq/L — ABNORMAL HIGH (ref 3–11)
BUN: 10 mg/dL (ref 7.0–26.0)
CO2: 24 mEq/L (ref 22–29)
Creatinine: 1.8 mg/dL — ABNORMAL HIGH (ref 0.7–1.3)
Glucose: 97 mg/dl (ref 70–140)
Sodium: 139 mEq/L (ref 136–145)
Total Bilirubin: 0.36 mg/dL (ref 0.20–1.20)

## 2013-10-23 LAB — CBC WITH DIFFERENTIAL/PLATELET
BASO%: 0.1 % (ref 0.0–2.0)
Basophils Absolute: 0 10*3/uL (ref 0.0–0.1)
EOS%: 0.1 % (ref 0.0–7.0)
Eosinophils Absolute: 0 10*3/uL (ref 0.0–0.5)
HCT: 26.6 % — ABNORMAL LOW (ref 38.4–49.9)
LYMPH%: 20.2 % (ref 14.0–49.0)
MCHC: 35.3 g/dL (ref 32.0–36.0)
MCV: 95.7 fL (ref 79.3–98.0)
MONO#: 0.7 10*3/uL (ref 0.1–0.9)
MONO%: 10.7 % (ref 0.0–14.0)
NEUT#: 4.7 10*3/uL (ref 1.5–6.5)
NEUT%: 68.9 % (ref 39.0–75.0)
Platelets: 358 10*3/uL (ref 140–400)
WBC: 6.8 10*3/uL (ref 4.0–10.3)

## 2013-10-23 MED ORDER — IOHEXOL 300 MG/ML  SOLN
80.0000 mL | Freq: Once | INTRAMUSCULAR | Status: AC | PRN
  Administered 2013-10-23: 80 mL via INTRAVENOUS

## 2013-10-24 ENCOUNTER — Other Ambulatory Visit: Payer: Self-pay

## 2013-10-25 ENCOUNTER — Telehealth: Payer: Self-pay | Admitting: *Deleted

## 2013-10-25 NOTE — Telephone Encounter (Signed)
Pt verbalized understanding.  SLJ 

## 2013-10-25 NOTE — Telephone Encounter (Signed)
Message copied by Caren Griffins on Fri Oct 25, 2013  3:43 PM ------      Message from: Conni Slipper      Created: Fri Oct 25, 2013  2:31 PM       Abnormal results, please call  and notify patient to push po fluids ------

## 2013-10-28 ENCOUNTER — Ambulatory Visit: Payer: Self-pay

## 2013-10-29 ENCOUNTER — Telehealth: Payer: Self-pay | Admitting: Internal Medicine

## 2013-10-29 ENCOUNTER — Ambulatory Visit (HOSPITAL_COMMUNITY)
Admission: RE | Admit: 2013-10-29 | Discharge: 2013-10-29 | Disposition: A | Discharge status: Home / Self Care | Payer: Medicaid Other | Source: Ambulatory Visit | Attending: Internal Medicine | Admitting: Internal Medicine

## 2013-10-29 ENCOUNTER — Other Ambulatory Visit: Payer: Self-pay | Admitting: Internal Medicine

## 2013-10-29 ENCOUNTER — Encounter: Payer: Self-pay | Admitting: Internal Medicine

## 2013-10-29 ENCOUNTER — Ambulatory Visit (HOSPITAL_BASED_OUTPATIENT_CLINIC_OR_DEPARTMENT_OTHER): Payer: Medicaid Other | Admitting: Internal Medicine

## 2013-10-29 VITALS — BP 128/87 | HR 91 | Temp 97.0°F | Resp 18 | Ht 74.0 in | Wt 169.0 lb

## 2013-10-29 DIAGNOSIS — C3491 Malignant neoplasm of unspecified part of right bronchus or lung: Secondary | ICD-10-CM

## 2013-10-29 DIAGNOSIS — R188 Other ascites: Secondary | ICD-10-CM | POA: Insufficient documentation

## 2013-10-29 DIAGNOSIS — C3490 Malignant neoplasm of unspecified part of unspecified bronchus or lung: Secondary | ICD-10-CM | POA: Insufficient documentation

## 2013-10-29 DIAGNOSIS — C3430 Malignant neoplasm of lower lobe, unspecified bronchus or lung: Secondary | ICD-10-CM

## 2013-10-29 DIAGNOSIS — C778 Secondary and unspecified malignant neoplasm of lymph nodes of multiple regions: Secondary | ICD-10-CM

## 2013-10-29 NOTE — Procedures (Signed)
Successful US guided paracentesis from RLQ.  Yielded 4.1 liters of serous fluid.  No immediate complications.  Pt tolerated well.   Specimen was sent for labs.  Pattricia Boss D PA-C 10/29/2013 11:24 AM

## 2013-10-29 NOTE — Telephone Encounter (Signed)
Gave pt appt for lab and ML for 1 week and thoracentesis tomorrow @ [facility]

## 2013-10-29 NOTE — Progress Notes (Signed)
[facility] [phone]   [phone]  OFFICE PROGRESS NOTE  Geraldo Pitter, MD [address] [place] [address]  DIAGNOSIS: Metastatic non-small cell lung cancer, adenocarcinoma with negative EGFR mutation and negative ALK gene translocation diagnosed in December of 2013.   PRIOR THERAPY:  1. Status post Pleurx catheter placement for drainage of right-sided pleural effusion under the care of Dr. Laneta Simmers on 11/29/2012.  2. Systemic chemotherapy with carboplatin for AUC of 6, paclitaxel 200 mg/M2 and Avastin 15 mg/kg every 3 weeks according to the ECoG protocol 5508. Status post 4 cycles.  3. Maintenance chemotherapy according to the ECoG 5508 protocol, patient has been randomized to pemetrexed 500 mg per meter squared given every 3 weeks. Status post 6 cycles.  4. Maintenance chemotherapy with single agent Alimta at 500 mg per meter squared given every 3 weeks status post 2 cycles. Last dose was given 09/16/2013 discontinued today secondary to intolerance.  CURRENT THERAPY: None.  CHEMOTHERAPY INTENT: Palliative  CURRENT # OF CHEMOTHERAPY CYCLES: 8  CURRENT ANTIEMETICS: Zofran, dexamethasone and Compazine  CURRENT SMOKING STATUS: Former smoker quit 11/25/2012  ORAL CHEMOTHERAPY AND CONSENT: None  CURRENT BISPHOSPHONATES USE: None  PAIN MANAGEMENT: 0/10  NARCOTICS INDUCED CONSTIPATION: None  LIVING WILL AND CODE STATUS: No CODE BLUE   INTERVAL HISTORY: Jay Watts 52 y.o. male returns to the clinic today for followup visit. Followup visit. The patient still complaining of increasing fatigue and weakness as well as lack of appetite and early satiety. He continues to have abdominal distention and shortness breath with exertion. He denied having any significant chest pain, cough or hemoptysis. The patient denied having any nausea or vomiting. He has no fever or chills. He had repeat CT scan of the chest, abdomen and pelvis performed  recently and he is here for evaluation and discussion of his scan results. He has improvement in the swelling of the lower extremities.  MEDICAL HISTORY: Past Medical History  Diagnosis Date  . Bipolar 2 disorder   . Shortness of breath     with exertion  . Pneumothorax     right side- spring 2013  . Lung cancer   . Adenocarcinoma, lung 11/30/2012  . Unspecified hypothyroidism 10/10/2013  . Malnutrition of moderate degree 10/08/2013    ALLERGIES:  has No Known Allergies.  MEDICATIONS:  Current Outpatient Prescriptions  Medication Sig Dispense Refill  . lamoTRIgine (LAMICTAL) 150 MG tablet Take 150 mg by mouth at bedtime.      Marland Kitchen levothyroxine (SYNTHROID, LEVOTHROID) 50 MCG tablet Take 1 tablet (50 mcg total) by mouth daily before breakfast.  30 tablet  2  . mirtazapine (REMERON) 45 MG tablet Take 45 mg by mouth at bedtime.      . Multiple Vitamin (MULTIVITAMIN WITH MINERALS) TABS Take 1 tablet by mouth daily.      . polyethylene glycol powder (MIRALAX) powder Take 17 g by mouth daily.  255 g  0  . PRESCRIPTION MEDICATION Inject into the vein every 21 ( twenty-one) days. Alimta infusion q3weeks.      . traZODone (DESYREL) 100 MG tablet Take 1 tablet (100 mg total) by mouth at bedtime as needed for sleep.  30 tablet  2   No current facility-administered medications for this visit.    SURGICAL HISTORY:  Past Surgical History  Procedure Laterality Date  . Turbinate reduction    . Chest tube insertion  11/29/2012    Procedure: INSERTION PLEURAL DRAINAGE  CATHETER;  Surgeon: [name], MD;  Location: [facility] OR;  Service: Thoracic;  Laterality: Right;  . Removal of pleural drainage catheter Right 02/14/2013    Procedure: REMOVAL OF PLEURAL DRAINAGE CATHETER;  Surgeon: [name], MD;  Location: [facility] OR;  Service: Thoracic;  Laterality: Right;    REVIEW OF SYSTEMS:  Constitutional: positive for fatigue Eyes: negative Ears, nose, mouth, throat, and face: negative Respiratory:  positive for dyspnea on exertion Cardiovascular: negative Gastrointestinal: negative Genitourinary:negative Integument/breast: negative Hematologic/lymphatic: positive for petechiae Musculoskeletal:negative Neurological: negative Behavioral/Psych: negative Endocrine: negative Allergic/Immunologic: negative   PHYSICAL EXAMINATION: General appearance: alert, cooperative, fatigued and no distress Head: Normocephalic, without obvious abnormality, atraumatic Neck: no adenopathy, no JVD, supple, symmetrical, trachea midline and thyroid not enlarged, symmetric, no tenderness/mass/nodules Lymph nodes: Cervical, supraclavicular, and axillary nodes normal. Resp: clear to auscultation bilaterally Back: symmetric, no curvature. ROM normal. No CVA tenderness. Cardio: regular rate and rhythm, S1, S2 normal, no murmur, click, rub or gallop GI: soft, non-tender; very distended with bowel sounds normal; no masses,  no organomegaly Extremities: extremities no edema with redness secondary to venous stasis. Neurologic: Alert and oriented X 3, normal strength and tone. Normal symmetric reflexes. Normal coordination and gait  ECOG PERFORMANCE STATUS: 1 - Symptomatic but completely ambulatory  Blood pressure 128/87, pulse 91, temperature 97 F (36.1 C), temperature source Oral, resp. rate 18, height 6\' 2"  (1.88 m), weight 169 lb (76.658 kg).  LABORATORY DATA: Lab Results  Component Value Date   WBC 6.8 10/23/2013   HGB 9.4* 10/23/2013   HCT 26.6* 10/23/2013   MCV 95.7 10/23/2013   PLT 358 10/23/2013      Chemistry      Component Value Date/Time   NA 139 10/23/2013 0808   NA 134* 10/10/2013 0405   K 3.6 10/23/2013 0808   K 3.6 10/10/2013 0405   CL 101 10/10/2013 0405   CL 98 06/03/2013 0914   CO2 24 10/23/2013 0808   CO2 22 10/10/2013 0405   BUN 10.0 10/23/2013 0808   BUN 9 10/10/2013 0405   CREATININE 1.8* 10/23/2013 0808   CREATININE 1.47* 10/10/2013 0405      Component Value Date/Time    CALCIUM 9.8 10/23/2013 0808   CALCIUM 8.3* 10/10/2013 0405   ALKPHOS 105 10/23/2013 0808   ALKPHOS 65 10/08/2013 0425   AST 29 10/23/2013 0808   AST 33 10/08/2013 0425   ALT 12 10/23/2013 0808   ALT 12 10/08/2013 0425   BILITOT 0.36 10/23/2013 0808   BILITOT 0.7 10/08/2013 0425       RADIOGRAPHIC STUDIES: Ct Chest W Contrast  10/23/2013   CLINICAL DATA:  Lung cancer followup, ongoing chemotherapy. Nausea and vomiting.  EXAM: CT CHEST, ABDOMEN, AND PELVIS WITH CONTRAST  TECHNIQUE: Multidetector CT imaging of the chest, abdomen and pelvis was performed following the standard protocol during bolus administration of intravenous contrast.  CONTRAST:  80mL OMNIPAQUE IOHEXOL 300 MG/ML  SOLN  COMPARISON:  CT chest 08/23/2013 and CT abdomen pelvis 03/15/2013.  FINDINGS:   CT CHEST FINDINGS  Mediastinal lymph nodes measure up to 11 mm in the lower right paratracheal station, stable. Subcarinal lymph node measures 1.5 cm (previously 1.3 cm). No hilar or axillary adenopathy. Heart size within normal limits. No pericardial effusion.  Moderate bilateral pleural effusions, increased on the right and new on the left. A necrotic appearing lesion at the apex of the right upper lobe measures approximately 2.9 x 4.8 cm, possibly minimally larger than on the prior exam,  at which time it measured 2.7 x 4.5 cm. Soft tissue extends to the right hilum. Bullous emphysema. Rounded soft tissue in the right lower lobe (image 47) appears grossly stable. There is adjacent compressive atelectasis, making definitive evaluation difficult. Compressive atelectasis is also seen in the left lower lobe.    CT ABDOMEN AND PELVIS FINDINGS  Liver, gallbladder and adrenal glands are unremarkable. There is somewhat of a diffuse striated appearance to the kidneys on nephrographic phase imaging. Spleen, pancreas and stomach are unremarkable. There is dilatation and wall thickening involving the jejunum. The ileum is decompressed and unremarkable  in appearance. Colon is fairly decompressed throughout an otherwise unremarkable.  Large ascites, new. Scattered atherosclerotic calcification of the arterial vasculature without abdominal aortic aneurysm. Celiac axis, superior mesenteric artery and inferior mesenteric artery opacify normally. Gastrohepatic ligament lymph nodes measure up to 10 mm.  No worrisome lytic or sclerotic lesions.    IMPRESSION: 1. Favor slight enlargement of a necrotic appearing lesion in the apex of the right upper lobe, worrisome for disease recurrence/ progression. An infectious process is not excluded. 2. Rounded opacity in the right lower lobe appears grossly stable and has an appearance indicative of rounded atelectasis, but is difficult to definitively evaluate given adjacent compressive atelectasis. 3. Moderate bilateral effusions, increased on the right and new on the left. 4. Large ascites, new. 5. Mild dilatation and wall thickening involving the jejunum may be due to fluid third-spacing. An infectious or inflammatory etiology is not excluded. No obstruction. 6. Striated appearance of the kidneys on nephrographic phase imaging. Question development of renal insufficiency. 7. No evidence of metastatic disease in the abdomen or pelvis.   Electronically Signed   By: Leanna Battles M.D.   On: 10/23/2013 09:50    ASSESSMENT AND PLAN: This is a very pleasant 52 years old white male with metastatic non-small cell lung cancer, adenocarcinoma status post 6 cycles of induction chemotherapy was carboplatin, paclitaxel and Avastin followed by 8 cycles of maintenance Avastin discontinued recently secondary tointolerance with significant pancytopenia. The patient also has significant abdominal distention that is new compared to few weeks ago. Unfortunately his recent scan showed no nausea arthritis as well as minimal increase in the size of the right upper lobe lung mass and mediastinal lymphadenopathy in addition to bilateral pleural  effusion. I discussed the scan results with the patient today. I recommended for him to proceed with ultrasound-guided paracentesis and we'll send the ascites fluid for cytologic evaluation. I will also arrange for the patient to have right-sided thoracentesis, and maybe left-sided thoracentesis in the near future. I discussed with the patient his treatment options including treatment with oral Tarceva. Unfortunately the inguinal therapy clinical trial is currently closed. I will discuss his treatment options in more details in the upcoming visit after improvement in his general condition. He would come back for followup visit in one week for reevaluation. He was advised to call immediately if he has concerning symptoms in the interval. The patient voices understanding of current disease status and treatment options and is in agreement with the current care plan. All questions were answered. The patient knows to call the clinic with any problems, questions or concerns. We can certainly see the patient much sooner if necessary.  I spent 15 minutes counseling the patient face to face. The total time spent in the appointment was 25 minutes.

## 2013-10-29 NOTE — Patient Instructions (Signed)
Your scan showed evidence for disease progression. I will arrange for him to have drainage of the ascites fluid as well as the right pleural effusion. Followup visit in one week

## 2013-10-30 ENCOUNTER — Ambulatory Visit (HOSPITAL_COMMUNITY)
Admission: RE | Admit: 2013-10-30 | Discharge: 2013-10-30 | Disposition: A | Discharge status: Home / Self Care | Payer: Medicaid Other | Source: Ambulatory Visit | Attending: Radiology | Admitting: Radiology

## 2013-10-30 ENCOUNTER — Ambulatory Visit (HOSPITAL_COMMUNITY)
Admission: RE | Admit: 2013-10-30 | Discharge: 2013-10-30 | Disposition: A | Discharge status: Home / Self Care | Payer: Medicaid Other | Source: Ambulatory Visit | Attending: Internal Medicine | Admitting: Internal Medicine

## 2013-10-30 ENCOUNTER — Other Ambulatory Visit: Payer: Self-pay | Admitting: Internal Medicine

## 2013-10-30 DIAGNOSIS — C3491 Malignant neoplasm of unspecified part of right bronchus or lung: Secondary | ICD-10-CM

## 2013-10-30 DIAGNOSIS — J90 Pleural effusion, not elsewhere classified: Secondary | ICD-10-CM | POA: Insufficient documentation

## 2013-10-30 DIAGNOSIS — C3490 Malignant neoplasm of unspecified part of unspecified bronchus or lung: Secondary | ICD-10-CM | POA: Insufficient documentation

## 2013-10-30 NOTE — Procedures (Addendum)
[place] of chest finds severely loculated right pleural effusion, likely related to his prior hx of a PleurX catheter on that side. Not amenable to thoracentesis. Moderate left sided pleural effusion seen, appropriate for thoracentesis  Successful US guided left thoracentesis. Yielded of clear yellow fluid. Pt tolerated procedure well. No immediate complications.  Specimen was not sent for labs. CXR ordered.  Brayton El PA-C 10/30/2013 11:31 AM

## 2013-11-04 ENCOUNTER — Encounter: Payer: Self-pay | Admitting: Physician Assistant

## 2013-11-04 ENCOUNTER — Telehealth: Payer: Self-pay | Admitting: Internal Medicine

## 2013-11-04 ENCOUNTER — Ambulatory Visit (HOSPITAL_BASED_OUTPATIENT_CLINIC_OR_DEPARTMENT_OTHER): Payer: Medicaid Other | Admitting: Physician Assistant

## 2013-11-04 ENCOUNTER — Other Ambulatory Visit (HOSPITAL_BASED_OUTPATIENT_CLINIC_OR_DEPARTMENT_OTHER): Payer: Medicaid Other | Admitting: Lab

## 2013-11-04 ENCOUNTER — Ambulatory Visit (HOSPITAL_BASED_OUTPATIENT_CLINIC_OR_DEPARTMENT_OTHER): Payer: Medicaid Other | Admitting: Lab

## 2013-11-04 ENCOUNTER — Encounter: Payer: Self-pay | Admitting: *Deleted

## 2013-11-04 VITALS — BP 120/87 | HR 84 | Temp 95.2°F | Ht 75.0 in | Wt 160.0 lb

## 2013-11-04 DIAGNOSIS — C3430 Malignant neoplasm of lower lobe, unspecified bronchus or lung: Secondary | ICD-10-CM

## 2013-11-04 DIAGNOSIS — C778 Secondary and unspecified malignant neoplasm of lymph nodes of multiple regions: Secondary | ICD-10-CM

## 2013-11-04 DIAGNOSIS — C3490 Malignant neoplasm of unspecified part of unspecified bronchus or lung: Secondary | ICD-10-CM

## 2013-11-04 DIAGNOSIS — E039 Hypothyroidism, unspecified: Secondary | ICD-10-CM

## 2013-11-04 DIAGNOSIS — C3491 Malignant neoplasm of unspecified part of right bronchus or lung: Secondary | ICD-10-CM

## 2013-11-04 LAB — CBC WITH DIFFERENTIAL/PLATELET
Basophils Absolute: 0 10*3/uL (ref 0.0–0.1)
Eosinophils Absolute: 0.1 10*3/uL (ref 0.0–0.5)
MCHC: 34.9 g/dL (ref 32.0–36.0)
MCV: 98.4 fL — ABNORMAL HIGH (ref 79.3–98.0)
MONO#: 0.8 10*3/uL (ref 0.1–0.9)
MONO%: 13.5 % (ref 0.0–14.0)
NEUT#: 3.5 10*3/uL (ref 1.5–6.5)
RBC: 2.56 10*6/uL — ABNORMAL LOW (ref 4.20–5.82)
RDW: 18.9 % — ABNORMAL HIGH (ref 11.0–14.6)
lymph#: 1.4 10*3/uL (ref 0.9–3.3)

## 2013-11-04 LAB — COMPREHENSIVE METABOLIC PANEL (CC13)
AST: 29 U/L (ref 5–34)
Albumin: 3.2 g/dL — ABNORMAL LOW (ref 3.5–5.0)
Alkaline Phosphatase: 99 U/L (ref 40–150)
Calcium: 9.5 mg/dL (ref 8.4–10.4)
Chloride: 99 mEq/L (ref 98–109)
Glucose: 90 mg/dl (ref 70–140)
Potassium: 4 mEq/L (ref 3.5–5.1)
Sodium: 136 mEq/L (ref 136–145)
Total Protein: 6.9 g/dL (ref 6.4–8.3)

## 2013-11-04 LAB — TSH CHCC: TSH: 90.409 m(IU)/L — ABNORMAL HIGH (ref 0.320–4.118)

## 2013-11-04 NOTE — Patient Instructions (Signed)
Start taking Tarceva 150 mg by mouth daily Followup in 3 weeks

## 2013-11-04 NOTE — Progress Notes (Addendum)
[facility] [phone]   [phone]  OFFICE PROGRESS NOTE  Geraldo Pitter, MD [address] [place] [address]  DIAGNOSIS: Metastatic non-small cell lung cancer, adenocarcinoma with negative EGFR mutation and negative ALK gene translocation diagnosed in December of 2013.   PRIOR THERAPY:  1. Status post Pleurx catheter placement for drainage of right-sided pleural effusion under the care of Dr. Laneta Simmers on 11/29/2012.  2. Systemic chemotherapy with carboplatin for AUC of 6, paclitaxel 200 mg/M2 and Avastin 15 mg/kg every 3 weeks according to the ECoG protocol 5508. Status post 4 cycles.  3. Maintenance chemotherapy according to the ECoG 5508 protocol, patient has been randomized to pemetrexed 500 mg per meter squared given every 3 weeks. Status post 6 cycles.  4. Maintenance chemotherapy with single agent Alimta at 500 mg per meter squared given every 3 weeks status post 2 cycles. Last dose was given 09/16/2013 discontinued today secondary to intolerance.  CURRENT THERAPY: None.  CHEMOTHERAPY INTENT: Palliative  CURRENT # OF CHEMOTHERAPY CYCLES: 8  CURRENT ANTIEMETICS: Zofran, dexamethasone and Compazine  CURRENT SMOKING STATUS: Former smoker quit 11/25/2012  ORAL CHEMOTHERAPY AND CONSENT: None  CURRENT BISPHOSPHONATES USE: None  PAIN MANAGEMENT: 0/10  NARCOTICS INDUCED CONSTIPATION: None  LIVING WILL AND CODE STATUS: No CODE BLUE   INTERVAL HISTORY: Jay Watts 52 y.o. male returns to the clinic today for a followup visit. He is status post a paracentesis on [birth year]/30/[birth year] year old of 4.1 L of ascites. The fluid was sent off for pathology and it revealed reactive mesothelial cells. He underwent left thoracentesis on 10/30/2013 yielding 650 ML's of clear yellow fluid. The fluid was not sent off for any laboratory evaluation. He reports a mild low ache in the abdominal area. He states he felt significantly better after the  paracentesis. Generally he feels a little better and reports that his appetite has slightly improved. He is still primarily ingesting boost dietary supplement drinks, yogurt and fresh fruit. He is not eating much in the way of meat. He complains that when he does eat he gets heartburn and is treating this with TUMS. He continues to complain of fatigue and feeling weak in his legs. He had some significant issues with constipation and ended and needing to take a "triple dose" of ex-lax until he finally had some relief. He reports that his bowel movements are now normal. He denied having any significant chest pain, cough or hemoptysis. The patient denied having any nausea or vomiting. He has no fever or chills.   MEDICAL HISTORY: Past Medical History  Diagnosis Date  . Bipolar 2 disorder   . Shortness of breath     with exertion  . Pneumothorax     right side- spring 2013  . Lung cancer   . Adenocarcinoma, lung 11/30/2012  . Unspecified hypothyroidism 10/10/2013  . Malnutrition of moderate degree 10/08/2013    ALLERGIES:  has No Known Allergies.  MEDICATIONS:  Current Outpatient Prescriptions  Medication Sig Dispense Refill  . lamoTRIgine (LAMICTAL) 150 MG tablet Take 150 mg by mouth at bedtime.      Marland Kitchen levothyroxine (SYNTHROID, LEVOTHROID) 50 MCG tablet Take 1 tablet (50 mcg total) by mouth daily before breakfast.  30 tablet  2  . mirtazapine (REMERON) 45 MG tablet Take 45 mg by mouth at bedtime.      . Multiple Vitamin (MULTIVITAMIN WITH MINERALS) TABS Take 1 tablet by mouth daily.      . polyethylene glycol  powder (MIRALAX) powder Take 17 g by mouth daily.  255 g  0  . PRESCRIPTION MEDICATION Inject into the vein every 21 ( twenty-one) days. Alimta infusion q3weeks.      . traZODone (DESYREL) 100 MG tablet Take 1 tablet (100 mg total) by mouth at bedtime as needed for sleep.  30 tablet  2   No current facility-administered medications for this visit.    SURGICAL HISTORY:  Past  Surgical History  Procedure Laterality Date  . Turbinate reduction    . Chest tube insertion  11/29/2012    Procedure: INSERTION PLEURAL DRAINAGE CATHETER;  Surgeon: [name], MD;  Location: [facility] OR;  Service: Thoracic;  Laterality: Right;  . Removal of pleural drainage catheter Right 02/14/2013    Procedure: REMOVAL OF PLEURAL DRAINAGE CATHETER;  Surgeon: [name], MD;  Location: [facility] OR;  Service: Thoracic;  Laterality: Right;    REVIEW OF SYSTEMS:  Constitutional: positive for fatigue Eyes: negative Ears, nose, mouth, throat, and face: negative Respiratory: positive for dyspnea on exertion Cardiovascular: negative Gastrointestinal: negative Genitourinary:negative Integument/breast: negative Hematologic/lymphatic: positive for petechiae Musculoskeletal:negative Neurological: negative Behavioral/Psych: negative Endocrine: negative Allergic/Immunologic: negative   PHYSICAL EXAMINATION: General appearance: alert, cooperative, fatigued and no distress Head: Normocephalic, without obvious abnormality, atraumatic Neck: no adenopathy, no JVD, supple, symmetrical, trachea midline and thyroid not enlarged, symmetric, no tenderness/mass/nodules Lymph nodes: Cervical, supraclavicular, and axillary nodes normal. Resp: clear to auscultation bilaterally Back: symmetric, no curvature. ROM normal. No CVA tenderness. Cardio: regular rate and rhythm, S1, S2 normal, no murmur, click, rub or gallop GI: soft, non-tender; non-distended with bowel sounds normal; no masses,  no organomegaly Extremities: extremities no edema with redness secondary to venous stasis. Neurologic: Alert and oriented X 3, normal strength and tone. Normal symmetric reflexes. Normal coordination and gait  ECOG PERFORMANCE STATUS: 1 - Symptomatic but completely ambulatory  Blood pressure 120/87, pulse 84, temperature 95.2 F (35.1 C), temperature source Oral, height 6\' 3"  (1.905 m), weight 160 lb (72.576 kg), SpO2  97.00%.  LABORATORY DATA: Lab Results  Component Value Date   WBC 5.8 11/04/2013   HGB 8.8* 11/04/2013   HCT 25.2* 11/04/2013   MCV 98.4* 11/04/2013   PLT 632* 11/04/2013      Chemistry      Component Value Date/Time   NA 136 11/04/2013 0948   NA 134* 10/10/2013 0405   K 4.0 11/04/2013 0948   K 3.6 10/10/2013 0405   CL 101 10/10/2013 0405   CL 98 06/03/2013 0914   CO2 27 11/04/2013 0948   CO2 22 10/10/2013 0405   BUN 7.7 11/04/2013 0948   BUN 9 10/10/2013 0405   CREATININE 1.5* 11/04/2013 0948   CREATININE 1.47* 10/10/2013 0405      Component Value Date/Time   CALCIUM 9.5 11/04/2013 0948   CALCIUM 8.3* 10/10/2013 0405   ALKPHOS 99 11/04/2013 0948   ALKPHOS 65 10/08/2013 0425   AST 29 11/04/2013 0948   AST 33 10/08/2013 0425   ALT 14 11/04/2013 0948   ALT 12 10/08/2013 0425   BILITOT 0.28 11/04/2013 0948   BILITOT 0.7 10/08/2013 0425       RADIOGRAPHIC STUDIES: Ct Chest W Contrast  10/23/2013   CLINICAL DATA:  Lung cancer followup, ongoing chemotherapy. Nausea and vomiting.  EXAM: CT CHEST, ABDOMEN, AND PELVIS WITH CONTRAST  TECHNIQUE: Multidetector CT imaging of the chest, abdomen and pelvis was performed following the standard protocol during bolus administration of intravenous contrast.  CONTRAST:  80mL OMNIPAQUE IOHEXOL  300 MG/ML  SOLN  COMPARISON:  CT chest 08/23/2013 and CT abdomen pelvis 03/15/2013.  FINDINGS:   CT CHEST FINDINGS  Mediastinal lymph nodes measure up to 11 mm in the lower right paratracheal station, stable. Subcarinal lymph node measures 1.5 cm (previously 1.3 cm). No hilar or axillary adenopathy. Heart size within normal limits. No pericardial effusion.  Moderate bilateral pleural effusions, increased on the right and new on the left. A necrotic appearing lesion at the apex of the right upper lobe measures approximately 2.9 x 4.8 cm, possibly minimally larger than on the prior exam, at which time it measured 2.7 x 4.5 cm. Soft tissue extends to the  right hilum. Bullous emphysema. Rounded soft tissue in the right lower lobe (image 47) appears grossly stable. There is adjacent compressive atelectasis, making definitive evaluation difficult. Compressive atelectasis is also seen in the left lower lobe.    CT ABDOMEN AND PELVIS FINDINGS  Liver, gallbladder and adrenal glands are unremarkable. There is somewhat of a diffuse striated appearance to the kidneys on nephrographic phase imaging. Spleen, pancreas and stomach are unremarkable. There is dilatation and wall thickening involving the jejunum. The ileum is decompressed and unremarkable in appearance. Colon is fairly decompressed throughout an otherwise unremarkable.  Large ascites, new. Scattered atherosclerotic calcification of the arterial vasculature without abdominal aortic aneurysm. Celiac axis, superior mesenteric artery and inferior mesenteric artery opacify normally. Gastrohepatic ligament lymph nodes measure up to 10 mm.  No worrisome lytic or sclerotic lesions.    IMPRESSION: 1. Favor slight enlargement of a necrotic appearing lesion in the apex of the right upper lobe, worrisome for disease recurrence/ progression. An infectious process is not excluded. 2. Rounded opacity in the right lower lobe appears grossly stable and has an appearance indicative of rounded atelectasis, but is difficult to definitively evaluate given adjacent compressive atelectasis. 3. Moderate bilateral effusions, increased on the right and new on the left. 4. Large ascites, new. 5. Mild dilatation and wall thickening involving the jejunum may be due to fluid third-spacing. An infectious or inflammatory etiology is not excluded. No obstruction. 6. Striated appearance of the kidneys on nephrographic phase imaging. Question development of renal insufficiency. 7. No evidence of metastatic disease in the abdomen or pelvis.   Electronically Signed   By: Leanna Battles M.D.   On: 10/23/2013 09:50    ASSESSMENT AND PLAN: This  is a very pleasant 52 years old white male with metastatic non-small cell lung cancer, adenocarcinoma status post 6 cycles of induction chemotherapy was carboplatin, paclitaxel and Avastin followed by 8 cycles of maintenance Avastin discontinued recently secondary to intolerance with significant pancytopenia. Patient was discussed with also seen by Dr. Arbutus Ped. We will plan to start the patient on Tarceva. A prescription was sent to to contact as the patient currently has no insurance. I will expect that he will be received drug soon and will plan to see him in 3 weeks for a symptom management visit. The particular side effects were explained to the patient including but not limited to diarrhea and skin rash. Patient was told to take a medication either one hour before or 2 hours after eating. He is to avoid grapefruit or grapefruit juice. He may take Maisie Fus, Pepcid or Zantac if needed for his heartburn but it has to be at least 2 hours after he has taken his Tarceva. Patient voiced understanding of all of these instructions   Conni Slipper, PA-C   The patient voices understanding of current disease status  and treatment options and is in agreement with the current care plan. All questions were answered. The patient knows to call the clinic with any problems, questions or concerns. We can certainly see the patient much sooner if necessary.   ADDENDUM: Hematology/Oncology Attending: I had the face to face encounter with the patient today. I recommended his care plan. The patient is a 52 years old white male with metastatic non-small cell lung cancer, adenocarcinoma status post systemic chemotherapy with carboplatin, paclitaxel and Avastin followed by maintenance treatment with Alimta for a total of 8 cycles. His staging scan showed evidence for disease progression. The patient recently underwent left sided thoracentesis in addition to paracentesis with drainage of 4.1 L of ascites fluid. The ascites  fluid showed reactive mesothelial cells with no malignant cells identified. He is feeling much better. He continues to complain of increasing fatigue and weakness as well as dry skin. I have a lengthy discussion with the patient today about his current disease status and treatment options. The patient is currently too weak to resume any systemic chemotherapy. I recommended for him treatment with oral Tarceva 150 mg by mouth daily. I discussed with the patient adverse effect of this treatment including but not limited to skin rash, diarrhea, dry skin and interstitial lung disease.  The patient would like to proceed with treatment as planned. He signed a consent for oral Tarceva today. He was given a handout about Tarceva today.  The patient would come back for follow up visit in 3 weeks for evaluation and management any adverse effect of his treatment. He was advised to call immediately if he has any concerning symptoms in the interval. Lajuana Matte., MD 11/04/2013

## 2013-11-04 NOTE — Progress Notes (Signed)
11/04/13 at 10:10am- Post-enrollment visit.  The pt was into the cancer center this morning for his routine lab and appt with Dr. Asa Lente PA.  The pt was greeted upon arrival and given his questionnaires (PRO's) to complete.  The pt completed the EQ-[id number] first, and then he completed the LCSS booklet.  The research nurse reviewed the PRO's for completeness and accuracy.  He was thanked for his continued support on this study.  His research blood will be drawn at his next visit.

## 2013-11-04 NOTE — Telephone Encounter (Signed)
gv and printed appt sched and avs for pt for DEC...sent pt to the lab

## 2013-11-05 ENCOUNTER — Ambulatory Visit: Payer: Self-pay | Admitting: Physician Assistant

## 2013-11-13 ENCOUNTER — Other Ambulatory Visit: Payer: Self-pay | Admitting: Physician Assistant

## 2013-11-13 DIAGNOSIS — E039 Hypothyroidism, unspecified: Secondary | ICD-10-CM

## 2013-11-13 MED ORDER — LEVOTHYROXINE SODIUM 75 MCG PO TABS: 75.0000 ug | ORAL_TABLET | Freq: Every day | ORAL | Status: DC

## 2013-11-15 ENCOUNTER — Other Ambulatory Visit: Payer: Self-pay | Admitting: Physician Assistant

## 2013-11-15 ENCOUNTER — Encounter: Payer: Self-pay | Admitting: Physician Assistant

## 2013-11-15 DIAGNOSIS — R188 Other ascites: Secondary | ICD-10-CM

## 2013-11-15 DIAGNOSIS — C3490 Malignant neoplasm of unspecified part of unspecified bronchus or lung: Secondary | ICD-10-CM

## 2013-11-15 NOTE — Progress Notes (Signed)
Patient returned message left for him regarding his thyroid medication. Patient was informed that his current thyroid dose is not adequately addressing his level of hypothyroidism. A in increased dose of levothyroxine at 75 mcg by mouth daily was sent to his pharmacy of record via E. scribed. Patient is currently in [place] with family for the Thanksgiving holiday. He complains of increased abdominal girth is increased heartburn and states that "I should have had my abdomen Before I left for the holiday". Patient is requesting a therapeutic paracentesis to be scheduled when he returns to [place]. Patient states that he will be arriving approximately after 4 PM on Monday, 11/18/2013. He requests a procedure be scheduled for Tuesday, 11/19/2013. He has not as yet received his Tarceva, that is to be delivered on the the third or fourth of December 2014. Since he has not started her Tarceva the patient tells me that he has been taking Prilosec for his heartburn. He states this has been somewhat helpful. Patient is aware that he will not be up to take this particular medication or class of medications while he is taking Tarceva.  Laural Benes, Vere Diantonio E, PA-C

## 2013-11-18 ENCOUNTER — Telehealth: Payer: Self-pay | Admitting: Internal Medicine

## 2013-11-18 NOTE — Telephone Encounter (Signed)
central radiology schedulling will call pt re paracentesis appt.  no other orders.

## 2013-11-19 ENCOUNTER — Ambulatory Visit (HOSPITAL_COMMUNITY)
Admission: RE | Admit: 2013-11-19 | Discharge: 2013-11-19 | Disposition: A | Discharge status: Home / Self Care | Payer: Medicaid Other | Source: Ambulatory Visit | Attending: Physician Assistant | Admitting: Physician Assistant

## 2013-11-19 VITALS — BP 121/79

## 2013-11-19 DIAGNOSIS — C801 Malignant (primary) neoplasm, unspecified: Secondary | ICD-10-CM | POA: Insufficient documentation

## 2013-11-19 DIAGNOSIS — C3490 Malignant neoplasm of unspecified part of unspecified bronchus or lung: Secondary | ICD-10-CM | POA: Insufficient documentation

## 2013-11-19 DIAGNOSIS — R188 Other ascites: Secondary | ICD-10-CM | POA: Insufficient documentation

## 2013-11-19 NOTE — Procedures (Signed)
US guided therapeutic paracentesis performed yielding 4.9 liters yellow fluid. No immediate complications.

## 2013-11-20 ENCOUNTER — Other Ambulatory Visit: Payer: Self-pay | Admitting: *Deleted

## 2013-11-20 DIAGNOSIS — C3490 Malignant neoplasm of unspecified part of unspecified bronchus or lung: Secondary | ICD-10-CM

## 2013-11-25 ENCOUNTER — Encounter: Payer: Self-pay | Admitting: Physician Assistant

## 2013-11-25 ENCOUNTER — Other Ambulatory Visit (HOSPITAL_BASED_OUTPATIENT_CLINIC_OR_DEPARTMENT_OTHER): Payer: Medicaid Other

## 2013-11-25 ENCOUNTER — Telehealth: Payer: Self-pay | Admitting: Internal Medicine

## 2013-11-25 ENCOUNTER — Ambulatory Visit (HOSPITAL_COMMUNITY)
Admission: RE | Admit: 2013-11-25 | Discharge: 2013-11-25 | Disposition: A | Discharge status: Home / Self Care | Payer: Medicaid Other | Source: Ambulatory Visit | Attending: Physician Assistant | Admitting: Physician Assistant

## 2013-11-25 ENCOUNTER — Ambulatory Visit: Payer: Self-pay | Admitting: Internal Medicine

## 2013-11-25 ENCOUNTER — Ambulatory Visit (HOSPITAL_BASED_OUTPATIENT_CLINIC_OR_DEPARTMENT_OTHER): Payer: Medicaid Other | Admitting: Physician Assistant

## 2013-11-25 ENCOUNTER — Other Ambulatory Visit: Payer: Self-pay | Admitting: Lab

## 2013-11-25 ENCOUNTER — Encounter: Payer: Self-pay | Admitting: *Deleted

## 2013-11-25 VITALS — BP 136/92 | HR 88 | Temp 97.0°F | Resp 18 | Ht 75.0 in | Wt 171.4 lb

## 2013-11-25 DIAGNOSIS — C3430 Malignant neoplasm of lower lobe, unspecified bronchus or lung: Secondary | ICD-10-CM

## 2013-11-25 DIAGNOSIS — R0602 Shortness of breath: Secondary | ICD-10-CM

## 2013-11-25 DIAGNOSIS — C3490 Malignant neoplasm of unspecified part of unspecified bronchus or lung: Secondary | ICD-10-CM

## 2013-11-25 DIAGNOSIS — C778 Secondary and unspecified malignant neoplasm of lymph nodes of multiple regions: Secondary | ICD-10-CM

## 2013-11-25 DIAGNOSIS — R5381 Other malaise: Secondary | ICD-10-CM

## 2013-11-25 DIAGNOSIS — J90 Pleural effusion, not elsewhere classified: Secondary | ICD-10-CM | POA: Insufficient documentation

## 2013-11-25 DIAGNOSIS — J984 Other disorders of lung: Secondary | ICD-10-CM | POA: Insufficient documentation

## 2013-11-25 LAB — CBC WITH DIFFERENTIAL/PLATELET
BASO%: 0.8 % (ref 0.0–2.0)
EOS%: 1.8 % (ref 0.0–7.0)
Eosinophils Absolute: 0.1 10*3/uL (ref 0.0–0.5)
HCT: 27.2 % — ABNORMAL LOW (ref 38.4–49.9)
LYMPH%: 20.7 % (ref 14.0–49.0)
MCH: 34.2 pg — ABNORMAL HIGH (ref 27.2–33.4)
MCHC: 33.5 g/dL (ref 32.0–36.0)
MONO#: 0.4 10*3/uL (ref 0.1–0.9)
NEUT%: 69.8 % (ref 39.0–75.0)
Platelets: 311 10*3/uL (ref 140–400)
RBC: 2.66 10*6/uL — ABNORMAL LOW (ref 4.20–5.82)
RDW: 23.1 % — ABNORMAL HIGH (ref 11.0–14.6)
WBC: 6 10*3/uL (ref 4.0–10.3)
nRBC: 0 % (ref 0–0)

## 2013-11-25 LAB — COMPREHENSIVE METABOLIC PANEL (CC13)
ALT: 24 U/L (ref 0–55)
AST: 41 U/L — ABNORMAL HIGH (ref 5–34)
Alkaline Phosphatase: 97 U/L (ref 40–150)
BUN: 10.4 mg/dL (ref 7.0–26.0)
CO2: 27 mEq/L (ref 22–29)
Calcium: 9.7 mg/dL (ref 8.4–10.4)
Creatinine: 1.3 mg/dL (ref 0.7–1.3)
Total Bilirubin: 0.25 mg/dL (ref 0.20–1.20)

## 2013-11-25 NOTE — Patient Instructions (Signed)
You will have an chest x-ray today to evaluate your complaints of increased shortness of breath Continue taking Tarceva 150 mg by mouth daily Followup in 2 weeks for another symptom management visit Notify us immediately when you're abdominal fluid reaccumulation becomes too uncomfortable to bear, requiring another paracentesis

## 2013-11-25 NOTE — Progress Notes (Signed)
[facility] CA [id number]. Mr. Jay Watts was in the cancer center today for lab work and physical exam. He had blood drawn for clinical care and research. The phlebotomist reports difficulty obtaining the required amount of blood for the research specimen. He completed the PRO questionnaires before seeing a healthcare provider.  Mr. Jay Watts reports he reeived the prescription for erlotinib and took the first dose on Thursday, Dec. 4.

## 2013-11-25 NOTE — Telephone Encounter (Signed)
Gave pt appt for lab and Md for December 2014

## 2013-11-25 NOTE — Progress Notes (Addendum)
[facility] [phone]   [phone]  OFFICE PROGRESS NOTE  Geraldo Pitter, MD [address] [place] [address]  DIAGNOSIS: Metastatic non-small cell lung cancer, adenocarcinoma with negative EGFR mutation and negative ALK gene translocation diagnosed in December of 2013.   PRIOR THERAPY:  1. Status post Pleurx catheter placement for drainage of right-sided pleural effusion under the care of Dr. Laneta Simmers on 11/29/2012.  2. Systemic chemotherapy with carboplatin for AUC of 6, paclitaxel 200 mg/M2 and Avastin 15 mg/kg every 3 weeks according to the ECoG protocol 5508. Status post 4 cycles.  3. Maintenance chemotherapy according to the ECoG 5508 protocol, patient has been randomized to pemetrexed 500 mg per meter squared given every 3 weeks. Status post 6 cycles.  4. Maintenance chemotherapy with single agent Alimta at 500 mg per meter squared given every 3 weeks status post 2 cycles. Last dose was given 09/16/2013 discontinued today secondary to intolerance.  CURRENT THERAPY: Tarceva 150 mg by mouth daily. Therapy beginning 11/21/2013  CHEMOTHERAPY INTENT: Palliative  CURRENT # OF CHEMOTHERAPY CYCLES: 8  CURRENT ANTIEMETICS: Zofran, dexamethasone and Compazine  CURRENT SMOKING STATUS: Former smoker quit 11/25/2012  ORAL CHEMOTHERAPY AND CONSENT: None  CURRENT BISPHOSPHONATES USE: None  PAIN MANAGEMENT: 0/10  NARCOTICS INDUCED CONSTIPATION: None  LIVING WILL AND CODE STATUS: No CODE BLUE   INTERVAL HISTORY: Jay Watts 52 y.o. male returns to the clinic today for a followup visit. He is status post a paracentesis on 11/19/2013-yeilding of 4.9 L of ascites. Unfortunately the fluid was not sent off for reevaluation. He underwent left thoracentesis on 10/30/2013 yielding 650 ML's of clear yellow fluid. The fluid was not sent off for any laboratory evaluation. He complains of some shortness of breath. He reports that since having the  almost 5 L of fluid removed from his abdomen his appetite has improved and he is eating better. He is only been on Tarceva for about for 5 days and thus far as had no difficulties. He reports that he enjoyed his time in [place] with his family particularly spending time with his grandchild. He denied having any significant chest pain, cough or hemoptysis. The patient denied having any nausea or vomiting. He has no fever or chills.   MEDICAL HISTORY: Past Medical History  Diagnosis Date  . Bipolar 2 disorder   . Shortness of breath     with exertion  . Pneumothorax     right side- spring 2013  . Lung cancer   . Adenocarcinoma, lung 11/30/2012  . Unspecified hypothyroidism 10/10/2013  . Malnutrition of moderate degree 10/08/2013    ALLERGIES:  has No Known Allergies.  MEDICATIONS:  Current Outpatient Prescriptions  Medication Sig Dispense Refill  . lamoTRIgine (LAMICTAL) 150 MG tablet Take 150 mg by mouth at bedtime.      Marland Kitchen levothyroxine (SYNTHROID) 75 MCG tablet Take 1 tablet (75 mcg total) by mouth daily before breakfast.  30 tablet  1  . mirtazapine (REMERON) 45 MG tablet Take 45 mg by mouth at bedtime.      . Multiple Vitamin (MULTIVITAMIN WITH MINERALS) TABS Take 1 tablet by mouth daily.      . polyethylene glycol powder (MIRALAX) powder Take 17 g by mouth daily.  255 g  0  . traZODone (DESYREL) 100 MG tablet Take 1 tablet (100 mg total) by mouth at bedtime as needed for sleep.  30 tablet  2  . PRESCRIPTION MEDICATION Inject into the  vein every 21 ( twenty-one) days. Alimta infusion q3weeks.       No current facility-administered medications for this visit.    SURGICAL HISTORY:  Past Surgical History  Procedure Laterality Date  . Turbinate reduction    . Chest tube insertion  11/29/2012    Procedure: INSERTION PLEURAL DRAINAGE CATHETER;  Surgeon: [name], MD;  Location: [facility] OR;  Service: Thoracic;  Laterality: Right;  . Removal of pleural drainage catheter Right  02/14/2013    Procedure: REMOVAL OF PLEURAL DRAINAGE CATHETER;  Surgeon: [name], MD;  Location: [facility] OR;  Service: Thoracic;  Laterality: Right;    REVIEW OF SYSTEMS:  Constitutional: positive for fatigue Eyes: negative Ears, nose, mouth, throat, and face: negative Respiratory: positive for dyspnea on exertion Cardiovascular: negative Gastrointestinal: positive for Mild abdominal distention Genitourinary:negative Integument/breast: negative Hematologic/lymphatic: negative Musculoskeletal:negative Neurological: positive for paresthesia Behavioral/Psych: negative Endocrine: negative Allergic/Immunologic: negative   PHYSICAL EXAMINATION: General appearance: alert, cooperative, fatigued and no distress Head: Normocephalic, without obvious abnormality, atraumatic Neck: no adenopathy, no JVD, supple, symmetrical, trachea midline and thyroid not enlarged, symmetric, no tenderness/mass/nodules Lymph nodes: Cervical, supraclavicular, and axillary nodes normal. Resp: clear to auscultation bilaterally Back: symmetric, no curvature. ROM normal. No CVA tenderness. Cardio: regular rate and rhythm, S1, S2 normal, no murmur, click, rub or gallop GI: soft, non-tender; mildly distended with bowel sounds normal; no masses,  no organomegaly Extremities: extremities no edema with redness secondary to venous stasis. Neurologic: Alert and oriented X 3, normal strength and tone. Normal symmetric reflexes. Normal coordination and gait  ECOG PERFORMANCE STATUS: 1 - Symptomatic but completely ambulatory  Blood pressure 136/92, pulse 88, temperature 97 F (36.1 C), temperature source Oral, resp. rate 18, height 6\' 3"  (1.905 m), weight 171 lb 6.4 oz (77.747 kg), SpO2 97.00%.  LABORATORY DATA: Lab Results  Component Value Date   WBC 6.0 11/25/2013   HGB 9.1* 11/25/2013   HCT 27.2* 11/25/2013   MCV 102.3* 11/25/2013   PLT 311 11/25/2013      Chemistry      Component Value Date/Time   NA 138  11/25/2013 0959   NA 134* 10/10/2013 0405   K 3.8 11/25/2013 0959   K 3.6 10/10/2013 0405   CL 101 10/10/2013 0405   CL 98 06/03/2013 0914   CO2 27 11/25/2013 0959   CO2 22 10/10/2013 0405   BUN 10.4 11/25/2013 0959   BUN 9 10/10/2013 0405   CREATININE 1.3 11/25/2013 0959   CREATININE 1.47* 10/10/2013 0405      Component Value Date/Time   CALCIUM 9.7 11/25/2013 0959   CALCIUM 8.3* 10/10/2013 0405   ALKPHOS 97 11/25/2013 0959   ALKPHOS 65 10/08/2013 0425   AST 41* 11/25/2013 0959   AST 33 10/08/2013 0425   ALT 24 11/25/2013 0959   ALT 12 10/08/2013 0425   BILITOT 0.25 11/25/2013 0959   BILITOT 0.7 10/08/2013 0425       RADIOGRAPHIC STUDIES: Dg Chest 1 View  10/30/2013   CLINICAL DATA:  Post left-sided thoracentesis.  Lung cancer.  EXAM: CHEST - 1 VIEW  COMPARISON:  10/23/2013 CT and 09/2013 chest x-ray.  FINDINGS: No pneumothorax detected post thoracentesis.  Right-sided pleural effusion/pleural thickening and possible underlying recurrent tumor (best noted on CT).  IMPRESSION: No evidence of pneumothorax post thoracentesis.   Electronically Signed   By: Bridgett Larsson M.D.   On: 10/30/2013 11:47   Dg Chest 2 View  11/25/2013   CLINICAL DATA:  Lung cancer patient. Increasing  shortness of breath.  EXAM: CHEST  2 VIEW  COMPARISON:  10/30/2013  FINDINGS: Small left a moderate pleural effusions. There are irregularly thickened interstitial markings which are stable. Areas of reticular scarring most evident in the right lung base in both apices is also stable. There is stable pleural thickening at the right apex.  The cardiac silhouette is normal in size. No mediastinal or hilar masses are noted.  The bony thorax is diffusely demineralized.  IMPRESSION: Moderate right and small left effusions. The left pleural effusion is new or larger than it was previously. Right pleural effusion is similar.  Areas of interstitial and lung scarring are stable. No focal lung consolidation or pulmonary edema.    Electronically Signed   By: Amie Portland M.D.   On: 11/25/2013 13:36   US Paracentesis  11/19/2013   CLINICAL DATA:  Metastatic non-small cell lung carcinoma, recurrent ascites. Request is made for therapeutic paracentesis.  EXAM: ULTRASOUND GUIDED THERAPEUTIC  PARACENTESIS  COMPARISON:  PREVIOUS PARACENTESIS ON 10/29/2013.  PROCEDURE: An ultrasound guided paracentesis was thoroughly discussed with the patient and questions answered. The benefits, risks, alternatives and complications were also discussed. The patient understands and wishes to proceed with the procedure. Written consent was obtained.  Ultrasound was performed to localize and mark an adequate pocket of fluid in the left lower quadrant of the abdomen. The area was then prepped and draped in the normal sterile fashion. 1% Lidocaine was used for local anesthesia. Under ultrasound guidance a 19 gauge Yueh catheter was introduced. Paracentesis was performed. The catheter was removed and a dressing applied.  COMPLICATIONS: None immediate  FINDINGS: A total of approximately 4.9 liters of yellow fluid was removed.  IMPRESSION: Successful ultrasound guided therapeutic paracentesis yielding 4.9 liters of ascites.  Read by: Jeananne Rama ,P.A.-C.   Electronically Signed   By: Malachy Moan M.D.   On: 11/19/2013 11:14   US Paracentesis  10/29/2013   CLINICAL DATA:  Lung cancer, ascites  EXAM: ULTRASOUND GUIDED PARACENTESIS  TECHNIQUE: Survey ultrasound of the abdomen was performed and an appropriate skin entry site in the RLQ abdomen was selected. Skin site was marked, prepped with Betadine, and draped in usual sterile fashion, and infiltrated locally with 1% lidocaine. A 5 French multi sidehole Yueh sheath needle was advanced into the peritoneal space until fluid could be aspirated. The sheath was advanced and the needle removed. 4.1 liters of serous ascites were aspirated. No immediate complication. The fluid was sent for cytology.  IMPRESSION:  Technically successful ultrasound guided paracentesis, removing 4.1 liters of ascites.  Read By:  Pattricia Boss PA-C   Electronically Signed   By: Simonne Come M.D.   On: 10/29/2013 13:04   US Thoracentesis Asp Pleural Space W/img Guide  10/30/2013   CLINICAL DATA:  Metastatic lung cancer, bilateral pleural effusions. Request thoracentesis.  EXAM: ULTRASOUND GUIDED left THORACENTESIS  COMPARISON:  CT scan of the chest from 10/23/2013  FINDINGS: A total of approximately 650 mL of clear yellow fluid was removed. A fluid sample was notsent for laboratory analysis.  IMPRESSION: Successful ultrasound guided left thoracentesis yielding 650 mL of pleural fluid.  Read by: Brayton El PA-C  PROCEDURE: An ultrasound guided thoracentesis was thoroughly discussed with the patient and questions answered. The benefits, risks, alternatives and complications were also discussed. The patient understands and wishes to proceed with the procedure. Written consent was obtained.  Ultrasound survey of bilateral chest fields finds a severely loculated effusion on the right. This is likely consistent  with patient's known history of lung cancer and prior placement of pleural drainage catheter which likely caused mechanical pleurodesis. The right-side is not amenable for thoracentesis. Moderate-sized left-sided pleural effusion appears simple and amenable for thoracentesis  Ultrasound was performed to localize and mark an adequate pocket of fluid in the left chest. The area was then prepped and draped in the normal sterile fashion. 1% Lidocaine was used for local anesthesia. Under ultrasound guidance a 19 gauge Yueh catheter was introduced. Thoracentesis was performed. The catheter was removed and a dressing applied.  Complications:  None immediate   Electronically Signed   By: Ruel Favors M.D.   On: 10/30/2013 11:47     ASSESSMENT AND PLAN: This is a very pleasant 52 years old white male with metastatic non-small cell lung cancer,  adenocarcinoma status post 6 cycles of induction chemotherapy was carboplatin, paclitaxel and Avastin followed by 8 cycles of maintenance Avastin discontinued recently secondary to intolerance with significant pancytopenia. Patient was discussed with also seen by Dr. Arbutus Ped. Started Tarceva 150 mg by mouth daily on 11/19/2013. We will plan to have the patient come back in another 2 weeks for symptom management visit. Regarding the reaccumulation of ascites and the increasing amount, the next time the patient requires paracentesis we will requests fluid be sent off for cytology/pathology. The initial evaluation did not reveal malignant cells however his presentation is extremely concerning for malignant ascites. Regarding his shortness of breath, the patient did require a previous left thoracentesis and fluid from that was not sent for cytology. We will obtain a chest x-ray today looking for increased pleural effusion and will schedule a therapeutic thoracentesis if needed.   Patient voiced understanding of all of these instructions   Silas Sedam E, PA-C   The patient voices understanding of current disease status and treatment options and is in agreement with the current care plan. All questions were answered. The patient knows to call the clinic with any problems, questions or concerns. We can certainly see the patient much sooner if necessary.  ADDENDUM: Hematology/Oncology Attending: I had a face to face encounter with the patient. I recommended her care plan. This is a very pleasant 52 years old white male with metastatic non-small cell lung cancer, adenocarcinoma with recent evidence for disease progression. The patient was started on Tarceva 150 mg by mouth daily status post 5 days of treatment. He is rating his treatment fairly well with no significant adverse effects. He denied having any skin rash or diarrhea. The patient recently had ultrasound guided paracenteses with drainage of 4.9 L of  ascitic fluid He continues to complain of increasing shortness of breath and fatigue. I will order a chest x-ray on him today to evaluate for pleural effusion and if needed we would consider the patient for ultrasound guided thoracentesis. The patient would come back for follow up visit in 2 weeks for reevaluation and consideration of ultrasound guided paracentesis if needed. He was advised to call earlier if he has any significant abdominal distention or further shortness of breath. He will continue on Tarceva with the same dose. The patient was advised to call immediately if he has any concerning symptoms in the interval. Lajuana Matte., MD 11/26/2013

## 2013-12-03 ENCOUNTER — Other Ambulatory Visit: Payer: Self-pay | Admitting: *Deleted

## 2013-12-03 DIAGNOSIS — C3490 Malignant neoplasm of unspecified part of unspecified bronchus or lung: Secondary | ICD-10-CM

## 2013-12-05 ENCOUNTER — Ambulatory Visit (HOSPITAL_COMMUNITY)
Admission: RE | Admit: 2013-12-05 | Discharge: 2013-12-05 | Disposition: A | Discharge status: Home / Self Care | Payer: Medicaid Other | Source: Ambulatory Visit | Attending: Physician Assistant | Admitting: Physician Assistant

## 2013-12-05 ENCOUNTER — Ambulatory Visit (HOSPITAL_COMMUNITY): Payer: MEDICAID

## 2013-12-05 VITALS — BP 124/88

## 2013-12-05 DIAGNOSIS — C3490 Malignant neoplasm of unspecified part of unspecified bronchus or lung: Secondary | ICD-10-CM | POA: Insufficient documentation

## 2013-12-05 DIAGNOSIS — R188 Other ascites: Secondary | ICD-10-CM | POA: Insufficient documentation

## 2013-12-05 NOTE — Procedures (Signed)
US guided diagnostic/therapeutic paracentesis performed yielding 6 liters yellow fluid. A portion of the fluid was sent to the lab for preordered studies. No immediate complications. 

## 2013-12-06 ENCOUNTER — Ambulatory Visit (HOSPITAL_COMMUNITY)
Admission: RE | Admit: 2013-12-06 | Discharge: 2013-12-06 | Disposition: A | Discharge status: Home / Self Care | Payer: Medicaid Other | Source: Ambulatory Visit | Attending: Radiology | Admitting: Radiology

## 2013-12-06 ENCOUNTER — Ambulatory Visit (HOSPITAL_COMMUNITY)
Admission: RE | Admit: 2013-12-06 | Discharge: 2013-12-06 | Disposition: A | Discharge status: Home / Self Care | Payer: Medicaid Other | Source: Ambulatory Visit | Attending: Physician Assistant | Admitting: Physician Assistant

## 2013-12-06 ENCOUNTER — Other Ambulatory Visit: Payer: Self-pay | Admitting: *Deleted

## 2013-12-06 ENCOUNTER — Other Ambulatory Visit (HOSPITAL_COMMUNITY): Payer: Self-pay

## 2013-12-06 VITALS — BP 97/61

## 2013-12-06 DIAGNOSIS — C3490 Malignant neoplasm of unspecified part of unspecified bronchus or lung: Secondary | ICD-10-CM

## 2013-12-06 DIAGNOSIS — J90 Pleural effusion, not elsewhere classified: Secondary | ICD-10-CM | POA: Insufficient documentation

## 2013-12-06 NOTE — Procedures (Signed)
US guided diagnostic/therapeutic left thoracentesis performed yielding 1.1 liters yellow fluid. The fluid was sent to the lab for cytology. F/u CXR pending. No immediate complications.  

## 2013-12-09 ENCOUNTER — Encounter: Payer: Self-pay | Admitting: *Deleted

## 2013-12-09 ENCOUNTER — Other Ambulatory Visit (HOSPITAL_BASED_OUTPATIENT_CLINIC_OR_DEPARTMENT_OTHER): Payer: Medicaid Other

## 2013-12-09 ENCOUNTER — Ambulatory Visit (HOSPITAL_BASED_OUTPATIENT_CLINIC_OR_DEPARTMENT_OTHER): Payer: Medicaid Other | Admitting: Physician Assistant

## 2013-12-09 ENCOUNTER — Telehealth: Payer: Self-pay | Admitting: Internal Medicine

## 2013-12-09 ENCOUNTER — Encounter: Payer: Self-pay | Admitting: Physician Assistant

## 2013-12-09 VITALS — BP 125/80 | HR 87 | Temp 97.1°F | Resp 18 | Ht 75.0 in | Wt 158.6 lb

## 2013-12-09 DIAGNOSIS — C3490 Malignant neoplasm of unspecified part of unspecified bronchus or lung: Secondary | ICD-10-CM

## 2013-12-09 DIAGNOSIS — R188 Other ascites: Secondary | ICD-10-CM

## 2013-12-09 DIAGNOSIS — J910 Malignant pleural effusion: Secondary | ICD-10-CM

## 2013-12-09 DIAGNOSIS — C3430 Malignant neoplasm of lower lobe, unspecified bronchus or lung: Secondary | ICD-10-CM

## 2013-12-09 DIAGNOSIS — C778 Secondary and unspecified malignant neoplasm of lymph nodes of multiple regions: Secondary | ICD-10-CM

## 2013-12-09 LAB — COMPREHENSIVE METABOLIC PANEL (CC13)
Albumin: 2.5 g/dL — ABNORMAL LOW (ref 3.5–5.0)
Anion Gap: 9 mEq/L (ref 3–11)
CO2: 29 mEq/L (ref 22–29)
Creatinine: 1.2 mg/dL (ref 0.7–1.3)
Glucose: 77 mg/dl (ref 70–140)
Potassium: 3.5 mEq/L (ref 3.5–5.1)
Sodium: 137 mEq/L (ref 136–145)
Total Bilirubin: <0.2 mg/dL (ref 0.20–1.20)
Total Protein: 6.3 g/dL — ABNORMAL LOW (ref 6.4–8.3)

## 2013-12-09 LAB — CBC WITH DIFFERENTIAL/PLATELET
Basophils Absolute: 0 10*3/uL (ref 0.0–0.1)
Eosinophils Absolute: 0.1 10*3/uL (ref 0.0–0.5)
HCT: 28.2 % — ABNORMAL LOW (ref 38.4–49.9)
HGB: 9.8 g/dL — ABNORMAL LOW (ref 13.0–17.1)
LYMPH%: 25.2 % (ref 14.0–49.0)
MONO#: 0.5 10*3/uL (ref 0.1–0.9)
NEUT#: 3.3 10*3/uL (ref 1.5–6.5)
NEUT%: 63.4 % (ref 39.0–75.0)
Platelets: 367 10*3/uL (ref 140–400)
RBC: 2.72 10*6/uL — ABNORMAL LOW (ref 4.20–5.82)
WBC: 5.2 10*3/uL (ref 4.0–10.3)

## 2013-12-09 NOTE — Patient Instructions (Signed)
Continue Tarceva 150 mg by mouth daily Per your request a paracentesis has been ordered for 12/20/2013 Followup in 3 weeks for symptom management visit

## 2013-12-09 NOTE — Progress Notes (Addendum)
[facility] [phone]   [phone]  OFFICE PROGRESS NOTE  Jay Pitter, MD [address] [place] [address]  DIAGNOSIS: Metastatic non-small cell lung cancer, adenocarcinoma with negative EGFR mutation and negative ALK gene translocation diagnosed in December of 2013.   PRIOR THERAPY:  1. Status post Pleurx catheter placement for drainage of right-sided pleural effusion under the care of Dr. Laneta Simmers on 11/29/2012.  2. Systemic chemotherapy with carboplatin for AUC of 6, paclitaxel 200 mg/M2 and Avastin 15 mg/kg every 3 weeks according to the ECoG protocol 5508. Status post 4 cycles.  3. Maintenance chemotherapy according to the ECoG 5508 protocol, patient has been randomized to pemetrexed 500 mg per meter squared given every 3 weeks. Status post 6 cycles.  4. Maintenance chemotherapy with single agent Alimta at 500 mg per meter squared given every 3 weeks status post 2 cycles. Last dose was given 09/16/2013 discontinued today secondary to intolerance.  CURRENT THERAPY: Tarceva 150 mg by mouth daily. Therapy beginning 11/21/2013  CHEMOTHERAPY INTENT: Palliative  CURRENT # OF CHEMOTHERAPY CYCLES: 8  CURRENT ANTIEMETICS: Zofran, dexamethasone and Compazine  CURRENT SMOKING STATUS: Former smoker quit 11/25/2012  ORAL CHEMOTHERAPY AND CONSENT: None  CURRENT BISPHOSPHONATES USE: None  PAIN MANAGEMENT: 0/10  NARCOTICS INDUCED CONSTIPATION: None  LIVING WILL AND CODE STATUS: No CODE BLUE   INTERVAL HISTORY: Jay Watts 52 y.o. male returns to the clinic today for a followup visit. He is status post a paracentesis on 12/05/2013-yeilding of 6.0 L of ascites. Fluid was sent for cytology and revealed reactive mesothelial cells. He underwent left thoracentesis on 11/06/2013 yielding 1.1L of clear yellow fluid. Cytology is pending. Overall he feels better after the paracentesis and thoracentesis procedures. Thus far he is tolerating his  Tarceva therapy without difficulty. He denied any issues with diarrhea or skin rash. He does note some shortness of breath with exertion. His albumin has been low at 2.8. He also has a history of previously being a heavy drinker. 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 He is planning to go back to visit his family in [place] for Christmas. He requests that we schedule him for paracentesis on 12/20/2013 as he feels that he will again need this procedure by then.  He denied having any significant chest pain, cough or hemoptysis. The patient denied having any nausea or vomiting. He has no fever or chills. He continues to complain of heartburn but is getting some relief with Zantac.  MEDICAL HISTORY: Past Medical History  Diagnosis Date  . Bipolar 2 disorder   . Shortness of breath     with exertion  . Pneumothorax     right side- spring 2013  . Lung cancer   . Adenocarcinoma, lung 11/30/2012  . Unspecified hypothyroidism 10/10/2013  . Malnutrition of moderate degree 10/08/2013    ALLERGIES:  has No Known Allergies.  MEDICATIONS:  Current Outpatient Prescriptions  Medication Sig Dispense Refill  . lamoTRIgine (LAMICTAL) 150 MG tablet Take 150 mg by mouth at bedtime.      Marland Kitchen levothyroxine (SYNTHROID) 75 MCG tablet Take 1 tablet (75 mcg total) by mouth daily before breakfast.  30 tablet  1  . mirtazapine (REMERON) 45 MG tablet Take 45 mg by mouth at bedtime.      . Multiple Vitamin (MULTIVITAMIN WITH MINERALS) TABS Take  1 tablet by mouth daily.      . polyethylene glycol powder (MIRALAX) powder Take 17 g by mouth daily.  255 g  0  . PRESCRIPTION MEDICATION Inject into the vein every 21 ( twenty-one) days. Alimta infusion q3weeks.      . traZODone (DESYREL) 100 MG tablet Take 1 tablet (100 mg total) by mouth at bedtime as needed for sleep.  30 tablet  2   No current facility-administered medications for this  visit.    SURGICAL HISTORY:  Past Surgical History  Procedure Laterality Date  . Turbinate reduction    . Chest tube insertion  11/29/2012    Procedure: INSERTION PLEURAL DRAINAGE CATHETER;  Surgeon: [name], MD;  Location: [facility] OR;  Service: Thoracic;  Laterality: Right;  . Removal of pleural drainage catheter Right 02/14/2013    Procedure: REMOVAL OF PLEURAL DRAINAGE CATHETER;  Surgeon: [name], MD;  Location: [facility] OR;  Service: Thoracic;  Laterality: Right;    REVIEW OF SYSTEMS:  Constitutional: positive for fatigue Eyes: negative Ears, nose, mouth, throat, and face: negative Respiratory: positive for dyspnea on exertion Cardiovascular: negative Gastrointestinal: positive for reflux symptoms Genitourinary:negative Integument/breast: negative Hematologic/lymphatic: negative Musculoskeletal:negative Neurological: positive for paresthesia Behavioral/Psych: negative Endocrine: negative Allergic/Immunologic: negative   PHYSICAL EXAMINATION: General appearance: alert, cooperative, fatigued and no distress Head: Normocephalic, without obvious abnormality, atraumatic Neck: no adenopathy, no JVD, supple, symmetrical, trachea midline and thyroid not enlarged, symmetric, no tenderness/mass/nodules Lymph nodes: Cervical, supraclavicular, and axillary nodes normal. Resp: clear to auscultation bilaterally Back: symmetric, no curvature. ROM normal. No CVA tenderness. Cardio: regular rate and rhythm, S1, S2 normal, no murmur, click, rub or gallop GI: soft, non-tender; mildly distended with bowel sounds normal; no masses,  no organomegaly Extremities: extremities no edema with redness secondary to venous stasis. Neurologic: Alert and oriented X 3, normal strength and tone. Normal symmetric reflexes. Normal coordination and gait  ECOG PERFORMANCE STATUS: 1 - Symptomatic but completely ambulatory  Blood pressure 125/80, pulse 87, temperature 97.1 F (36.2 C), temperature source  Oral, resp. rate 18, height 6\' 3"  (1.905 m), weight 158 lb 9.6 oz (71.94 kg), SpO2 100.00%.  LABORATORY DATA: Lab Results  Component Value Date   WBC 5.2 12/09/2013   HGB 9.8* 12/09/2013   HCT 28.2* 12/09/2013   MCV 103.6* 12/09/2013   PLT 367 12/09/2013      Chemistry      Component Value Date/Time   NA 137 12/09/2013 0908   NA 134* 10/10/2013 0405   K 3.5 12/09/2013 0908   K 3.6 10/10/2013 0405   CL 101 10/10/2013 0405   CL 98 06/03/2013 0914   CO2 29 12/09/2013 0908   CO2 22 10/10/2013 0405   BUN 7.6 12/09/2013 0908   BUN 9 10/10/2013 0405   CREATININE 1.2 12/09/2013 0908   CREATININE 1.47* 10/10/2013 0405      Component Value Date/Time   CALCIUM 8.2* 12/09/2013 0908   CALCIUM 8.3* 10/10/2013 0405   ALKPHOS 102 12/09/2013 0908   ALKPHOS 65 10/08/2013 0425   AST 45* 12/09/2013 0908   AST 33 10/08/2013 0425   ALT 21 12/09/2013 0908   ALT 12 10/08/2013 0425   BILITOT <0.20 12/09/2013 0908   BILITOT 0.7 10/08/2013 0425       RADIOGRAPHIC STUDIES: Dg Chest 1 View  12/06/2013   CLINICAL DATA:  Status post left thoracentesis. History of lung carcinoma.  EXAM: CHEST - 1 VIEW  COMPARISON:  11/25/2013  FINDINGS: A left pleural effusion  has been completely evacuated with no visualized pleural fluid remaining. No pneumothorax is identified after the procedure. No edema is seen. Stable pleural fluid on the right with parenchymal scarring.  IMPRESSION: Evacuation of left pleural effusion.  No pneumothorax identified.   Electronically Signed   By: Irish Lack M.D.   On: 12/06/2013 12:40   Dg Chest 2 View  11/25/2013   CLINICAL DATA:  Lung cancer patient. Increasing shortness of breath.  EXAM: CHEST  2 VIEW  COMPARISON:  10/30/2013  FINDINGS: Small left a moderate pleural effusions. There are irregularly thickened interstitial markings which are stable. Areas of reticular scarring most evident in the right lung base in both apices is also stable. There is stable pleural  thickening at the right apex.  The cardiac silhouette is normal in size. No mediastinal or hilar masses are noted.  The bony thorax is diffusely demineralized.  IMPRESSION: Moderate right and small left effusions. The left pleural effusion is new or larger than it was previously. Right pleural effusion is similar.  Areas of interstitial and lung scarring are stable. No focal lung consolidation or pulmonary edema.   Electronically Signed   By: Amie Portland M.D.   On: 11/25/2013 13:36   US Paracentesis  12/05/2013   CLINICAL DATA:  Metastatic lung carcinoma, recurrent ascites, abdominal distention. Request is made for diagnostic and therapeutic paracentesis.  EXAM: ULTRASOUND GUIDED DIAGNOSTIC AND THERAPEUTIC PARACENTESIS  COMPARISON:  PREVIOUS PARACENTESIS ON 11/19/2013.  PROCEDURE: An ultrasound guided paracentesis was thoroughly discussed with the patient and questions answered. The benefits, risks, alternatives and complications were also discussed. The patient understands and wishes to proceed with the procedure. Written consent was obtained.  Ultrasound was performed to localize and mark an adequate pocket of fluid in the left lower. quadrant of the abdomen. The area was then prepped and draped in the normal sterile fashion. 1% Lidocaine was used for local anesthesia. Under ultrasound guidance a 19 gauge Yueh catheter was introduced. Paracentesis was performed. The catheter was removed and a dressing applied.  COMPLICATIONS: None immediate  FINDINGS: A total of approximately 6 liters of yellow fluid was removed. A fluid sample was sent for laboratory analysis.  IMPRESSION: Successful ultrasound guided diagnostic and therapeutic paracentesis yielding 6 liters of ascites.  Read by: Jeananne Rama ,P.A.-C.   Electronically Signed   By: Simonne Come M.D.   On: 12/05/2013 15:18   US Paracentesis  11/19/2013   CLINICAL DATA:  Metastatic non-small cell lung carcinoma, recurrent ascites. Request is made for  therapeutic paracentesis.  EXAM: ULTRASOUND GUIDED THERAPEUTIC  PARACENTESIS  COMPARISON:  PREVIOUS PARACENTESIS ON 10/29/2013.  PROCEDURE: An ultrasound guided paracentesis was thoroughly discussed with the patient and questions answered. The benefits, risks, alternatives and complications were also discussed. The patient understands and wishes to proceed with the procedure. Written consent was obtained.  Ultrasound was performed to localize and mark an adequate pocket of fluid in the left lower quadrant of the abdomen. The area was then prepped and draped in the normal sterile fashion. 1% Lidocaine was used for local anesthesia. Under ultrasound guidance a 19 gauge Yueh catheter was introduced. Paracentesis was performed. The catheter was removed and a dressing applied.  COMPLICATIONS: None immediate  FINDINGS: A total of approximately 4.9 liters of yellow fluid was removed.  IMPRESSION: Successful ultrasound guided therapeutic paracentesis yielding 4.9 liters of ascites.  Read by: Jeananne Rama ,P.A.-C.   Electronically Signed   By: Malachy Moan M.D.   On: 11/19/2013 11:14  US Thoracentesis Asp Pleural Space W/img Guide  12/06/2013   CLINICAL DATA:  Metastatic lung carcinoma, dyspnea, recurrent left pleural effusion. Request is made for diagnostic and therapeutic left thoracentesis.  EXAM: ULTRASOUND GUIDED DIAGNOSTIC AND THERAPEUTIC LEFT THORACENTESIS  COMPARISON:  PREVIOUS THORACENTESIS ON 10/30/2013.  FINDINGS: A total of approximately 1.1 liters of yellow fluid was removed. A fluid sample wassent for laboratory analysis.  IMPRESSION: Successful ultrasound guided diagnostic and therapeutic left thoracentesis yielding 1.1 liters of pleural fluid.  Read by: Jeananne Rama ,P.A.-C.  PROCEDURE: An ultrasound guided thoracentesis was thoroughly discussed with the patient and questions answered. The benefits, risks, alternatives and complications were also discussed. The patient understands and wishes to  proceed with the procedure. Written consent was obtained.  Ultrasound was performed to localize and mark an adequate pocket of fluid in the left chest. The area was then prepped and draped in the normal sterile fashion. 1% Lidocaine was used for local anesthesia. Under ultrasound guidance a 19 gauge Yueh catheter was introduced. Thoracentesis was performed. The catheter was removed and a dressing applied.  Complications:  none   Electronically Signed   By: Irish Lack M.D.   On: 12/06/2013 14:47    ASSESSMENT AND PLAN: This is a very pleasant 52 years old white male with metastatic non-small cell lung cancer, adenocarcinoma status post 6 cycles of induction chemotherapy was carboplatin, paclitaxel and Avastin followed by 8 cycles of maintenance Avastin discontinued recently secondary to intolerance with significant pancytopenia. Patient was discussed with also seen by Dr. Arbutus Ped. Started Tarceva 150 mg by mouth daily on 11/19/2013. He will continue on the Tarceva 150 mg by mouth daily and return in approximately 3 weeks for another symptom management visit. At that time he may also need to be set up for another therapeutic paracentesis. We will plan to do restaging CT scans at the end of January 2015 to reevaluate his disease. We'll place an order for a therapeutic paracentesis on 12/20/2013 for symptomatic relief. The cytology analysis is revealed reactive mesothelial cells however there remains a high index of suspicion for malignant process. Cytology is pending from the patient's recent thoracentesis on 12/06/2013. Patient voiced understanding of all of these instructions   Armine Rizzolo E, PA-C   The patient voices understanding of current disease status and treatment options and is in agreement with the current care plan. All questions were answered. The patient knows to call the clinic with any problems, questions or concerns. We can certainly see the patient much sooner if  necessary.  ADDENDUM: Hematology/Oncology Attending: I had the face to face encounter with the patient. I recommended for her care plan. This is a very pleasant 52 years old white male with metastatic non-small cell lung cancer, adenocarcinoma currently on treatment with Tarceva started 11/21/2013 and tolerating it fairly well with no significant skin rash or diarrhea. He continues to have an accumulation of ascites and the last drainage was up to 6 L. The cytology showed no malignant cells but mesothelial cells. He also underwent left-sided thoracentesis with drainage of 1.1 L of pleural fluid and the cytology was positive for adenocarcinoma. He is feeling much better after the paracentesis as well as the thoracentesis but continues to have shortness breath with exertion. I recommended for the patient to continue his current treatment with Tarceva. We will arrange for him to have another paracentesis in 2 weeks.  He would come back for follow up visit in 4 weeks for reevaluation with repeat CT scan of  the chest, abdomen and pelvis for restaging of his disease. He was advised to call immediately if he has any concerning symptoms in the interval. Lajuana Matte., MD 12/09/2013

## 2013-12-09 NOTE — Telephone Encounter (Signed)
Gave pt Appt for lab and ML, gave pt contact number to Radiology for paracenthesis on  January 2015

## 2013-12-09 NOTE — Progress Notes (Signed)
12/09/13 Jay Watts is in the cancer center today for lab work and physical exam. He independently completed the PROs for the BMS 118 study before procedures were performed.

## 2013-12-16 ENCOUNTER — Telehealth: Payer: Self-pay | Admitting: Dietician

## 2013-12-16 NOTE — Telephone Encounter (Addendum)
Nutrition Brief Note  Patient identified on [facility] Nutrition Screen.   Wt Readings from Last 30 Encounters:  12/09/13 158 lb 9.6 oz (71.94 kg)  11/25/13 171 lb 6.4 oz (77.747 kg)  11/04/13 160 lb (72.576 kg)  10/29/13 169 lb (76.658 kg)  10/14/13 168 lb 8 oz (76.431 kg)  10/10/13 162 lb 6.4 oz (73.664 kg)  10/07/13 163 lb 3.2 oz (74.027 kg)  09/23/13 165 lb 12.8 oz (75.206 kg)  09/16/13 163 lb 4.8 oz (74.072 kg)  08/26/13 161 lb 12.8 oz (73.392 kg)  08/05/13 163 lb 6.4 oz (74.118 kg)  07/15/13 162 lb (73.483 kg)  06/24/13 162 lb 11.2 oz (73.8 kg)  06/03/13 156 lb 6.4 oz (70.943 kg)  05/27/13 158 lb 8 oz (71.895 kg)  05/06/13 158 lb 11.2 oz (71.986 kg)  04/29/13 161 lb 3.2 oz (73.12 kg)  04/15/13 155 lb 9.6 oz (70.58 kg)  04/05/13 162 lb 4.8 oz (73.619 kg)  03/18/13 163 lb 1.6 oz (73.982 kg)  02/25/13 163 lb 6.4 oz (74.118 kg)  02/12/13 160 lb 8 oz (72.802 kg)  02/05/13 160 lb (72.576 kg)  02/04/13 160 lb 14.4 oz (72.984 kg)  01/14/13 160 lb (72.576 kg)  01/07/13 155 lb (70.308 kg)  12/24/12 155 lb 3.2 oz (70.398 kg)  12/13/12 152 lb (68.947 kg)  12/05/12 153 lb 14.4 oz (69.809 kg)  11/25/12 170 lb (77.111 kg)   Pt with metastatic lung cancer.   Wt hx reveals fluctuations in weight. Noted a 6# (3.9%) x 1 year, 0% x 6 months, 3% loss x 3 months, and 2# (1.3%) x 1 month.   Called pt at [phone]. He reports "You don't know the whole story; when the take fluid off of you you lose weight like crazy". He reports his appetite is good and he has no nutrition concerns at this time. Noted scheduled paracentesis for Jan 2015.   Manmeet Arzola A. Mayford Knife, RD, LDN Pager: [phone]

## 2013-12-20 ENCOUNTER — Ambulatory Visit (HOSPITAL_COMMUNITY)
Admission: RE | Admit: 2013-12-20 | Discharge: 2013-12-20 | Disposition: A | Discharge status: Home / Self Care | Payer: Medicaid Other | Source: Ambulatory Visit | Attending: Physician Assistant | Admitting: Physician Assistant

## 2013-12-20 DIAGNOSIS — R188 Other ascites: Secondary | ICD-10-CM | POA: Insufficient documentation

## 2013-12-20 DIAGNOSIS — C3490 Malignant neoplasm of unspecified part of unspecified bronchus or lung: Secondary | ICD-10-CM | POA: Insufficient documentation

## 2013-12-20 NOTE — Procedures (Signed)
Successful US guided paracentesis from RLQ.  Yielded 5.4 liters of clear yellow fluid.  No immediate complications.  Pt tolerated well.   Specimen was not sent for labs.  Tsosie Billing D PA-C 12/20/2013 11:43 AM

## 2013-12-31 ENCOUNTER — Other Ambulatory Visit: Payer: Self-pay | Admitting: *Deleted

## 2013-12-31 DIAGNOSIS — C3490 Malignant neoplasm of unspecified part of unspecified bronchus or lung: Secondary | ICD-10-CM

## 2014-01-01 ENCOUNTER — Encounter: Payer: Self-pay | Admitting: Physician Assistant

## 2014-01-01 ENCOUNTER — Other Ambulatory Visit (HOSPITAL_BASED_OUTPATIENT_CLINIC_OR_DEPARTMENT_OTHER): Payer: Medicaid Other

## 2014-01-01 ENCOUNTER — Telehealth: Payer: Self-pay | Admitting: Internal Medicine

## 2014-01-01 ENCOUNTER — Ambulatory Visit (HOSPITAL_BASED_OUTPATIENT_CLINIC_OR_DEPARTMENT_OTHER): Payer: Medicaid Other | Admitting: Physician Assistant

## 2014-01-01 ENCOUNTER — Ambulatory Visit (HOSPITAL_BASED_OUTPATIENT_CLINIC_OR_DEPARTMENT_OTHER): Payer: Medicaid Other

## 2014-01-01 VITALS — BP 113/77 | HR 107 | Temp 97.5°F | Resp 18 | Ht 75.0 in | Wt 161.1 lb

## 2014-01-01 DIAGNOSIS — R143 Flatulence: Secondary | ICD-10-CM

## 2014-01-01 DIAGNOSIS — C3490 Malignant neoplasm of unspecified part of unspecified bronchus or lung: Secondary | ICD-10-CM

## 2014-01-01 DIAGNOSIS — R142 Eructation: Secondary | ICD-10-CM

## 2014-01-01 DIAGNOSIS — R0989 Other specified symptoms and signs involving the circulatory and respiratory systems: Secondary | ICD-10-CM

## 2014-01-01 DIAGNOSIS — C3430 Malignant neoplasm of lower lobe, unspecified bronchus or lung: Secondary | ICD-10-CM

## 2014-01-01 DIAGNOSIS — R12 Heartburn: Secondary | ICD-10-CM

## 2014-01-01 DIAGNOSIS — R188 Other ascites: Secondary | ICD-10-CM

## 2014-01-01 DIAGNOSIS — C778 Secondary and unspecified malignant neoplasm of lymph nodes of multiple regions: Secondary | ICD-10-CM

## 2014-01-01 DIAGNOSIS — E039 Hypothyroidism, unspecified: Secondary | ICD-10-CM

## 2014-01-01 DIAGNOSIS — R0609 Other forms of dyspnea: Secondary | ICD-10-CM

## 2014-01-01 DIAGNOSIS — R141 Gas pain: Secondary | ICD-10-CM

## 2014-01-01 LAB — COMPREHENSIVE METABOLIC PANEL (CC13)
ALT: 13 U/L (ref 0–55)
AST: 28 U/L (ref 5–34)
Albumin: 2.1 g/dL — ABNORMAL LOW (ref 3.5–5.0)
Alkaline Phosphatase: 100 U/L (ref 40–150)
Anion Gap: 11 mEq/L (ref 3–11)
BUN: 7.3 mg/dL (ref 7.0–26.0)
CO2: 27 mEq/L (ref 22–29)
Calcium: 8.8 mg/dL (ref 8.4–10.4)
Chloride: 97 mEq/L — ABNORMAL LOW (ref 98–109)
Creatinine: 0.9 mg/dL (ref 0.7–1.3)
Glucose: 95 mg/dl (ref 70–140)
Potassium: 3.6 mEq/L (ref 3.5–5.1)
Sodium: 135 mEq/L — ABNORMAL LOW (ref 136–145)
Total Bilirubin: 0.28 mg/dL (ref 0.20–1.20)
Total Protein: 6.3 g/dL — ABNORMAL LOW (ref 6.4–8.3)

## 2014-01-01 LAB — CBC WITH DIFFERENTIAL/PLATELET
BASO%: 0.8 % (ref 0.0–2.0)
Basophils Absolute: 0.1 10*3/uL (ref 0.0–0.1)
EOS%: 0.9 % (ref 0.0–7.0)
Eosinophils Absolute: 0.1 10*3/uL (ref 0.0–0.5)
HCT: 29.3 % — ABNORMAL LOW (ref 38.4–49.9)
HGB: 10.2 g/dL — ABNORMAL LOW (ref 13.0–17.1)
LYMPH%: 19.3 % (ref 14.0–49.0)
MCH: 35.1 pg — AB (ref 27.2–33.4)
MCHC: 34.7 g/dL (ref 32.0–36.0)
MCV: 101.2 fL — AB (ref 79.3–98.0)
MONO#: 0.5 10*3/uL (ref 0.1–0.9)
MONO%: 8.2 % (ref 0.0–14.0)
NEUT%: 70.8 % (ref 39.0–75.0)
NEUTROS ABS: 4.6 10*3/uL (ref 1.5–6.5)
PLATELETS: 515 10*3/uL — AB (ref 140–400)
RBC: 2.9 10*6/uL — ABNORMAL LOW (ref 4.20–5.82)
RDW: 18.9 % — AB (ref 11.0–14.6)
WBC: 6.5 10*3/uL (ref 4.0–10.3)
lymph#: 1.2 10*3/uL (ref 0.9–3.3)

## 2014-01-01 LAB — RESEARCH LABS

## 2014-01-01 LAB — TSH CHCC: TSH: 67.43 m(IU)/L — ABNORMAL HIGH (ref 0.320–4.118)

## 2014-01-01 MED ORDER — TRAZODONE HCL 100 MG PO TABS: 100.0000 mg | ORAL_TABLET | Freq: Every evening | ORAL | Status: AC | PRN

## 2014-01-01 MED ORDER — LEVOTHYROXINE SODIUM 125 MCG PO TABS: 125.0000 ug | ORAL_TABLET | Freq: Every day | ORAL | Status: AC

## 2014-01-01 NOTE — Patient Instructions (Signed)
Your thyroid medication dose has been increased. Take this as prescribed. Return on 01/03/14 for a CXR and another therapeutic paracentesis. Continue taking Tarceva 150 mg by mouth daily Follow up with Dr. Julien Nordmann in 3 weeks with a restaging CT scan of your chest, abdomen and pelvis to re-evaluate your disease

## 2014-01-01 NOTE — Progress Notes (Addendum)
[place] Telephone:([phone]   Fax:([phone]) [place]  Elyn Peers, MD [phone] [address]. [address] [place] [address]  DIAGNOSIS: Metastatic non-small cell lung cancer, adenocarcinoma with negative EGFR mutation and negative ALK gene translocation diagnosed in December of 2013.   PRIOR THERAPY:  1. Status post Pleurx catheter placement for drainage of right-sided pleural effusion under the care of Dr. Cyndia Bent on 11/29/2012.  2. Systemic chemotherapy with carboplatin for AUC of 6, paclitaxel 200 mg/M2 and Avastin 15 mg/kg every 3 weeks according to the ECoG protocol 5508. Status post 4 cycles.  3. Maintenance chemotherapy according to the ECoG 5508 protocol, patient has been randomized to pemetrexed 500 mg per meter squared given every 3 weeks. Status post 6 cycles.  4. Maintenance chemotherapy with single agent Alimta at 500 mg per meter squared given every 3 weeks status post 2 cycles. Last dose was given 09/16/2013 discontinued today secondary to intolerance.  CURRENT THERAPY: Tarceva 150 mg by mouth daily. Therapy beginning 11/21/2013. Status post approximately 6 weeks of therapy.  CHEMOTHERAPY INTENT: Palliative  CURRENT # OF CHEMOTHERAPY CYCLES: 8  CURRENT ANTIEMETICS: Zofran, dexamethasone and Compazine  CURRENT SMOKING STATUS: Former smoker quit 11/25/2012  ORAL CHEMOTHERAPY AND CONSENT: None  CURRENT BISPHOSPHONATES USE: None  PAIN MANAGEMENT: 0/10  NARCOTICS INDUCED CONSTIPATION: None  LIVING WILL AND CODE STATUS: No CODE BLUE   INTERVAL HISTORY: Jay Watts 53 y.o. male returns to the clinic today for a followup visit. He is status post a paracentesis on 12/20/2013-yeilding of 5.4 L of ascites. He reports that he feels better for about 3-4 days after the paracentesis. During that time frame he does not have any issues with heartburn decreased appetite or constipation. He complains of increased abdominal girth today as  well as some increased shortness of breath. Thus far he is tolerating his Tarceva therapy without difficulty.  Regarding the Tarceva is not having any diarrhea and he has some dry skin but no distinct rash. He requests a refill for his trazodone and his levothyroxine.  He does note some shortness of breath with exertion. His albumin has been low now at 2.1. He also has a history of previously being a heavy drinker. 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 The patient denied having any nausea or vomiting. He has no fever or chills. He continues to complain of heartburn but is getting some relief with Zantac.  MEDICAL HISTORY: Past Medical History  Diagnosis Date  . Bipolar 2 disorder   . Shortness of breath     with exertion  . Pneumothorax     right side- spring 2013  . Lung cancer   . Adenocarcinoma, lung 11/30/2012  . Unspecified hypothyroidism 10/10/2013  . Malnutrition of moderate degree 10/08/2013    ALLERGIES:  has No Known Allergies.  MEDICATIONS:  Current Outpatient Prescriptions  Medication Sig Dispense Refill  . lamoTRIgine (LAMICTAL) 150 MG tablet Take 150 mg by mouth at bedtime.      . mirtazapine (REMERON) 45 MG tablet Take 45 mg by mouth at bedtime.      . Multiple Vitamin (MULTIVITAMIN WITH MINERALS) TABS Take 1 tablet by mouth daily.      . polyethylene glycol powder (MIRALAX) powder Take 17 g by mouth daily.  255 g  0  . PRESCRIPTION MEDICATION Inject into the vein every 21 ( twenty-one) days. Alimta infusion q3weeks.      . traZODone (DESYREL) 100 MG tablet Take 1 tablet (100 mg total) by mouth at bedtime as needed for sleep.  30 tablet  2  . levothyroxine (SYNTHROID) 125 MCG tablet Take 1 tablet (125 mcg total) by mouth daily before breakfast.  30 tablet  1   No current facility-administered medications for this visit.     SURGICAL HISTORY:  Past Surgical History  Procedure Laterality Date  . Turbinate reduction    . Chest tube insertion  11/29/2012    Procedure: INSERTION PLEURAL DRAINAGE CATHETER;  Surgeon: [name], MD;  Location: [facility] OR;  Service: Thoracic;  Laterality: Right;  . Removal of pleural drainage catheter Right 02/14/2013    Procedure: REMOVAL OF PLEURAL DRAINAGE CATHETER;  Surgeon: [name], MD;  Location: [facility] OR;  Service: Thoracic;  Laterality: Right;    REVIEW OF SYSTEMS:  Constitutional: positive for fatigue Eyes: negative Ears, nose, mouth, throat, and face: negative Respiratory: positive for dyspnea on exertion Cardiovascular: negative Gastrointestinal: positive for constipation, reflux symptoms and increased abdominal distention Genitourinary:negative Integument/breast: positive for dryness Hematologic/lymphatic: negative Musculoskeletal:negative Neurological: positive for paresthesia Behavioral/Psych: negative Endocrine: negative Allergic/Immunologic: negative   PHYSICAL EXAMINATION: General appearance: alert, cooperative, fatigued and no distress Head: Normocephalic, without obvious abnormality, atraumatic Neck: no adenopathy, no JVD, supple, symmetrical, trachea midline and thyroid not enlarged, symmetric, no tenderness/mass/nodules Lymph nodes: Cervical, supraclavicular, and axillary nodes normal. Resp: dullness and decreased breath sounds in the right lung fields with positive egophany, left lung fields clear  Back: symmetric, no curvature. ROM normal. No CVA tenderness. Cardio: regular rate and rhythm, S1, S2 normal, no murmur, click, rub or gallop GI: soft, non-tender; moderately distended with bowel sounds normal; no masses,  no organomegaly Extremities: extremities no edema with redness secondary to venous stasis. Neurologic: Alert and oriented X 3, normal strength and tone. Normal symmetric reflexes. Normal coordination and gait  ECOG PERFORMANCE  STATUS: 1 - Symptomatic but completely ambulatory  Blood pressure 113/77, pulse 107, temperature 97.5 F (36.4 C), resp. rate 18, height 6\' 3"  (1.905 m), weight 161 lb 1.6 oz (73.074 kg).  LABORATORY DATA: Lab Results  Component Value Date   WBC 6.5 01/01/2014   HGB 10.2* 01/01/2014   HCT 29.3* 01/01/2014   MCV 101.2* 01/01/2014   PLT 515* 01/01/2014      Chemistry      Component Value Date/Time   NA 135* 01/01/2014 0949   NA 134* 10/10/2013 0405   K 3.6 01/01/2014 0949   K 3.6 10/10/2013 0405   CL 101 10/10/2013 0405   CL 98 06/03/2013 0914   CO2 27 01/01/2014 0949   CO2 22 10/10/2013 0405   BUN 7.3 01/01/2014 0949   BUN 9 10/10/2013 0405   CREATININE 0.9 01/01/2014 0949   CREATININE 1.47* 10/10/2013 0405      Component Value Date/Time   CALCIUM 8.8 01/01/2014 0949   CALCIUM 8.3* 10/10/2013 0405   ALKPHOS 100 01/01/2014 0949   ALKPHOS 65 10/08/2013 0425   AST 28 01/01/2014 0949   AST 33 10/08/2013 0425   ALT 13 01/01/2014 0949   ALT 12 10/08/2013 0425   BILITOT 0.28 01/01/2014 0949   BILITOT 0.7 10/08/2013 0425       RADIOGRAPHIC STUDIES: Dg Chest 1 View  12/06/2013   CLINICAL DATA:  Status post left thoracentesis. History of lung carcinoma.  EXAM: CHEST - 1 VIEW  COMPARISON:  11/25/2013  FINDINGS: A left pleural effusion has been completely evacuated with no visualized pleural fluid remaining. No pneumothorax is identified after the procedure. No edema is seen. Stable pleural fluid on the right with parenchymal scarring.  IMPRESSION: Evacuation of left pleural effusion.  No pneumothorax identified.   Electronically Signed   By: Aletta Edouard M.D.   On: 12/06/2013 12:40   US Paracentesis  12/20/2013   CLINICAL DATA:  Metastatic lung cancer, recurrent ascites  EXAM: ULTRASOUND GUIDED PARACENTESIS  TECHNIQUE: Survey ultrasound of the abdomen was performed and an appropriate skin entry site in the RLQ abdomen was selected. Skin site was marked, prepped with Betadine, and draped in  usual sterile fashion, and infiltrated locally with 1% lidocaine. A 5 French multisidehole Yueh sheath needle was advanced into the peritoneal space until fluid could be aspirated. The sheath was advanced and the needle removed. 5.4 liters of clear yellow ascites were aspirated. No immediate complication.  IMPRESSION: Technically successful ultrasound guided paracentesis, removing 5.4 liters of ascites.  Read By:  Tsosie Billing PA-C   Electronically Signed   By: Arne Cleveland M.D.   On: 12/20/2013 13:45   US Paracentesis  12/05/2013   CLINICAL DATA:  Metastatic lung carcinoma, recurrent ascites, abdominal distention. Request is made for diagnostic and therapeutic paracentesis.  EXAM: ULTRASOUND GUIDED DIAGNOSTIC AND THERAPEUTIC PARACENTESIS  COMPARISON:  PREVIOUS PARACENTESIS ON 11/19/2013.  PROCEDURE: An ultrasound guided paracentesis was thoroughly discussed with the patient and questions answered. The benefits, risks, alternatives and complications were also discussed. The patient understands and wishes to proceed with the procedure. Written consent was obtained.  Ultrasound was performed to localize and mark an adequate pocket of fluid in the left lower. quadrant of the abdomen. The area was then prepped and draped in the normal sterile fashion. 1% Lidocaine was used for local anesthesia. Under ultrasound guidance a 19 gauge Yueh catheter was introduced. Paracentesis was performed. The catheter was removed and a dressing applied.  COMPLICATIONS: None immediate  FINDINGS: A total of approximately 6 liters of yellow fluid was removed. A fluid sample was sent for laboratory analysis.  IMPRESSION: Successful ultrasound guided diagnostic and therapeutic paracentesis yielding 6 liters of ascites.  Read by: Rowe Robert ,P.A.-C.   Electronically Signed   By: Sandi Mariscal M.D.   On: 12/05/2013 15:18   US Thoracentesis Asp Pleural Space W/img Guide  12/06/2013   CLINICAL DATA:  Metastatic lung carcinoma,  dyspnea, recurrent left pleural effusion. Request is made for diagnostic and therapeutic left thoracentesis.  EXAM: ULTRASOUND GUIDED DIAGNOSTIC AND THERAPEUTIC LEFT THORACENTESIS  COMPARISON:  PREVIOUS THORACENTESIS ON 10/30/2013.  FINDINGS: A total of approximately 1.1 liters of yellow fluid was removed. A fluid sample wassent for laboratory analysis.  IMPRESSION: Successful ultrasound guided diagnostic and therapeutic left thoracentesis yielding 1.1 liters of pleural fluid.  Read by: Rowe Robert ,P.A.-C.  PROCEDURE: An ultrasound guided thoracentesis was thoroughly discussed with the patient and questions answered. The benefits, risks, alternatives and complications were also discussed. The patient understands and wishes to proceed with the procedure. Written consent was obtained.  Ultrasound was performed to localize and mark an adequate pocket of fluid in the left chest. The area was then prepped and draped in the normal sterile fashion. 1% Lidocaine was used for local anesthesia. Under ultrasound guidance a 19 gauge Yueh catheter was introduced. Thoracentesis was performed. The catheter was removed and a dressing applied.  Complications:  none  Electronically Signed   By: Aletta Edouard M.D.   On: 12/06/2013 14:47    ASSESSMENT AND PLAN: This is a very pleasant 53 years old white male with metastatic non-small cell lung cancer, adenocarcinoma status post 6 cycles of induction chemotherapy was carboplatin, paclitaxel and Avastin followed by 8 cycles of maintenance Avastin discontinued recently secondary to intolerance with significant pancytopenia. Patient was discussed with also seen by Dr. Julien Nordmann. He started Tarceva 150 mg by mouth daily on 11/19/2013, status post approximately 6 weeks of therapy. He will continue on the Tarceva 150 mg by mouth daily. To evaluate the complaints of increased shortness of breath as well as the changes on his physical examination with decreased breath sounds, dullness  and positive egophony the patient will have a chest x-ray. He preferred to get this when he returns on Friday for another therapeutic paracentesis. We will again send fluid off for cytology as we strongly believe that the recurrent ascites is related to a malignant process. Will plan to have the patient return in approximately 3 weeks with restaging CT scan of the chest, abdomen and pelvis to reevaluate his disease to see if he is having any positive effects from the [place] therapy. Rechecked a TSH to reevaluate his therapy with levothyroxine. Although his TSH has improved from 90.41-67.30, it is still elevated. His current dose of levothyroxine is 75 mcg we will increase this to 125 mcg by mouth daily. Patient was made aware of the change in his dose. He was also given a prescription to refill his trazodone. Patient voiced understanding of all of these instructions   Dreshon Proffit E, PA-C   The patient voices understanding of current disease status and treatment options and is in agreement with the current care plan. All questions were answered. The patient knows to call the clinic with any problems, questions or concerns. We can certainly see the patient much sooner if necessary.  ADDENDUM: Hematology/Oncology Attending: I had the face to face encounter with the patient. I recommended his care plan. This is a very pleasant 53 years old white male with metastatic non-small cell lung cancer, adenocarcinoma currently on treatment with Tarceva 150 mg by mouth daily started early December of 2014. The patient is tolerating his treatment fairly well with no significant adverse effects. He continues to have abdominal distention as well as shortness of breath. He has dullness to percussion and decreased breath sounds on the right lung field. I will arrange for the patient to have ultrasound guided right sided thoracentesis in addition to ultrasound guided paracentesis. Will send the peritoneal fluid to be  tested for cytology. He was advised to continue his current treatment was Tarceva at the same dose. I would see the patient back for followup visit in 3 weeks with repeat CT scan of the chest, abdomen and pelvis for restaging of his disease. For hypothyroidism, would increase his dose of levothyroxine to 125 mcg by mouth daily. He was advised to call immediately if he has any concerning symptoms in the interval.  Disclaimer: This note was dictated with voice recognition software. Similar sounding words can inadvertently be transcribed and may not be corrected upon review. Eilleen Kempf., MD 01/01/2014

## 2014-01-01 NOTE — Telephone Encounter (Signed)
gv and printed aptp sched and avs for pt for Jan and Feb...gv pt barium.Marland KitchenMarland Kitchen

## 2014-01-03 ENCOUNTER — Ambulatory Visit (HOSPITAL_COMMUNITY)
Admission: RE | Admit: 2014-01-03 | Discharge: 2014-01-03 | Disposition: A | Discharge status: Home / Self Care | Payer: Medicaid Other | Source: Ambulatory Visit | Attending: Physician Assistant | Admitting: Physician Assistant

## 2014-01-03 ENCOUNTER — Telehealth: Payer: Self-pay | Admitting: Medical Oncology

## 2014-01-03 ENCOUNTER — Other Ambulatory Visit (HOSPITAL_COMMUNITY)
Admission: RE | Admit: 2014-01-03 | Discharge: 2014-01-03 | Disposition: A | Discharge status: Home / Self Care | Payer: Medicaid Other | Source: Ambulatory Visit | Attending: Radiology | Admitting: Radiology

## 2014-01-03 ENCOUNTER — Other Ambulatory Visit: Payer: Self-pay | Admitting: Physician Assistant

## 2014-01-03 VITALS — BP 114/76

## 2014-01-03 DIAGNOSIS — C3490 Malignant neoplasm of unspecified part of unspecified bronchus or lung: Secondary | ICD-10-CM | POA: Insufficient documentation

## 2014-01-03 DIAGNOSIS — R188 Other ascites: Secondary | ICD-10-CM | POA: Insufficient documentation

## 2014-01-03 DIAGNOSIS — Z85118 Personal history of other malignant neoplasm of bronchus and lung: Secondary | ICD-10-CM | POA: Insufficient documentation

## 2014-01-03 DIAGNOSIS — J90 Pleural effusion, not elsewhere classified: Secondary | ICD-10-CM | POA: Insufficient documentation

## 2014-01-03 NOTE — Procedures (Signed)
US guided diagnostic/therapeutic paracentesis performed yielding 3.9 liters slightly turbid, amber fluid. A portion of the fluid was sent to the lab for preordered studies. No immediate complications.

## 2014-01-03 NOTE — Telephone Encounter (Signed)
Per Jay Watts told pt he can take 2 ex-lax.

## 2014-01-03 NOTE — Telephone Encounter (Signed)
Paracentesis today . Pt reported it was difficult because "my bowels were in the way. i need to know if I can take 2 ex- lax instead of  one" Last good BM was 2 ago . He did have small BM 2 days ago.

## 2014-01-09 ENCOUNTER — Telehealth: Payer: Self-pay | Admitting: Medical Oncology

## 2014-01-09 DIAGNOSIS — C3490 Malignant neoplasm of unspecified part of unspecified bronchus or lung: Secondary | ICD-10-CM

## 2014-01-09 MED ORDER — ERLOTINIB HCL 150 MG PO TABS: 150.0000 mg | ORAL_TABLET | Freq: Every day | ORAL | Status: AC

## 2014-01-15 ENCOUNTER — Telehealth: Payer: Self-pay | Admitting: *Deleted

## 2014-01-15 ENCOUNTER — Other Ambulatory Visit: Payer: Self-pay | Admitting: *Deleted

## 2014-01-15 DIAGNOSIS — C3490 Malignant neoplasm of unspecified part of unspecified bronchus or lung: Secondary | ICD-10-CM

## 2014-01-15 NOTE — Telephone Encounter (Signed)
Pt called stating he is having increased SOB and is wondering if he may need a thoracentesis.  Per Dr Vista Mink, okay to arrange for tentative thoracentesis if the CT scan of the chest on 1/30 shows moderate to large pleural effusion.  Pt is aware of appts on 1/30

## 2014-01-17 ENCOUNTER — Ambulatory Visit (HOSPITAL_COMMUNITY)
Admission: RE | Admit: 2014-01-17 | Discharge: 2014-01-17 | Disposition: A | Discharge status: Home / Self Care | Payer: Medicaid Other | Source: Ambulatory Visit | Attending: Internal Medicine | Admitting: Internal Medicine

## 2014-01-17 ENCOUNTER — Ambulatory Visit (HOSPITAL_COMMUNITY)
Admission: RE | Admit: 2014-01-17 | Discharge: 2014-01-17 | Disposition: A | Discharge status: Home / Self Care | Payer: Medicaid Other | Source: Ambulatory Visit | Attending: Physician Assistant | Admitting: Physician Assistant

## 2014-01-17 ENCOUNTER — Other Ambulatory Visit: Payer: Self-pay | Admitting: Internal Medicine

## 2014-01-17 VITALS — BP 100/70

## 2014-01-17 DIAGNOSIS — R0602 Shortness of breath: Secondary | ICD-10-CM | POA: Insufficient documentation

## 2014-01-17 DIAGNOSIS — R0989 Other specified symptoms and signs involving the circulatory and respiratory systems: Secondary | ICD-10-CM | POA: Insufficient documentation

## 2014-01-17 DIAGNOSIS — R05 Cough: Secondary | ICD-10-CM | POA: Insufficient documentation

## 2014-01-17 DIAGNOSIS — R0609 Other forms of dyspnea: Secondary | ICD-10-CM | POA: Insufficient documentation

## 2014-01-17 DIAGNOSIS — R059 Cough, unspecified: Secondary | ICD-10-CM | POA: Insufficient documentation

## 2014-01-17 DIAGNOSIS — R188 Other ascites: Secondary | ICD-10-CM | POA: Insufficient documentation

## 2014-01-17 DIAGNOSIS — C787 Secondary malignant neoplasm of liver and intrahepatic bile duct: Secondary | ICD-10-CM | POA: Insufficient documentation

## 2014-01-17 DIAGNOSIS — C3490 Malignant neoplasm of unspecified part of unspecified bronchus or lung: Secondary | ICD-10-CM | POA: Insufficient documentation

## 2014-01-17 DIAGNOSIS — R079 Chest pain, unspecified: Secondary | ICD-10-CM | POA: Insufficient documentation

## 2014-01-17 DIAGNOSIS — R599 Enlarged lymph nodes, unspecified: Secondary | ICD-10-CM | POA: Insufficient documentation

## 2014-01-17 DIAGNOSIS — J90 Pleural effusion, not elsewhere classified: Secondary | ICD-10-CM | POA: Insufficient documentation

## 2014-01-17 MED ORDER — IOHEXOL 300 MG/ML  SOLN
100.0000 mL | Freq: Once | INTRAMUSCULAR | Status: AC | PRN
  Administered 2014-01-17: 100 mL via INTRAVENOUS

## 2014-01-17 NOTE — Procedures (Signed)
US guided diagnostic/therapeutic paracentesis performed yielding 3.5 liters yellow fluid. A portion of the fluid was sent to the lab for cytology. No immediate complications.

## 2014-01-20 ENCOUNTER — Encounter: Payer: Self-pay | Admitting: *Deleted

## 2014-01-20 ENCOUNTER — Encounter: Payer: Self-pay | Admitting: Internal Medicine

## 2014-01-20 ENCOUNTER — Ambulatory Visit (HOSPITAL_BASED_OUTPATIENT_CLINIC_OR_DEPARTMENT_OTHER): Payer: Medicaid Other | Admitting: Internal Medicine

## 2014-01-20 ENCOUNTER — Other Ambulatory Visit (HOSPITAL_BASED_OUTPATIENT_CLINIC_OR_DEPARTMENT_OTHER): Payer: Medicaid Other

## 2014-01-20 ENCOUNTER — Telehealth: Payer: Self-pay | Admitting: *Deleted

## 2014-01-20 VITALS — BP 105/72 | HR 119 | Temp 96.8°F | Resp 20 | Ht 75.0 in | Wt 145.3 lb

## 2014-01-20 DIAGNOSIS — R143 Flatulence: Secondary | ICD-10-CM

## 2014-01-20 DIAGNOSIS — R141 Gas pain: Secondary | ICD-10-CM

## 2014-01-20 DIAGNOSIS — R0989 Other specified symptoms and signs involving the circulatory and respiratory systems: Secondary | ICD-10-CM

## 2014-01-20 DIAGNOSIS — R5383 Other fatigue: Secondary | ICD-10-CM

## 2014-01-20 DIAGNOSIS — C3430 Malignant neoplasm of lower lobe, unspecified bronchus or lung: Secondary | ICD-10-CM

## 2014-01-20 DIAGNOSIS — R142 Eructation: Secondary | ICD-10-CM

## 2014-01-20 DIAGNOSIS — J910 Malignant pleural effusion: Secondary | ICD-10-CM

## 2014-01-20 DIAGNOSIS — R5381 Other malaise: Secondary | ICD-10-CM

## 2014-01-20 DIAGNOSIS — R0609 Other forms of dyspnea: Secondary | ICD-10-CM

## 2014-01-20 DIAGNOSIS — C3490 Malignant neoplasm of unspecified part of unspecified bronchus or lung: Secondary | ICD-10-CM

## 2014-01-20 LAB — COMPREHENSIVE METABOLIC PANEL (CC13)
ALBUMIN: 1.8 g/dL — AB (ref 3.5–5.0)
ALK PHOS: 149 U/L (ref 40–150)
ALT: 33 U/L (ref 0–55)
AST: 55 U/L — AB (ref 5–34)
Anion Gap: 11 mEq/L (ref 3–11)
BUN: 8.3 mg/dL (ref 7.0–26.0)
CALCIUM: 8.6 mg/dL (ref 8.4–10.4)
CO2: 26 mEq/L (ref 22–29)
Chloride: 96 mEq/L — ABNORMAL LOW (ref 98–109)
Creatinine: 0.8 mg/dL (ref 0.7–1.3)
Glucose: 89 mg/dl (ref 70–140)
POTASSIUM: 3.5 meq/L (ref 3.5–5.1)
SODIUM: 134 meq/L — AB (ref 136–145)
TOTAL PROTEIN: 5.7 g/dL — AB (ref 6.4–8.3)
Total Bilirubin: 0.26 mg/dL (ref 0.20–1.20)

## 2014-01-20 LAB — CBC WITH DIFFERENTIAL/PLATELET
BASO%: 0.4 % (ref 0.0–2.0)
BASOS ABS: 0 10*3/uL (ref 0.0–0.1)
EOS%: 1.2 % (ref 0.0–7.0)
Eosinophils Absolute: 0.1 10*3/uL (ref 0.0–0.5)
HCT: 28.3 % — ABNORMAL LOW (ref 38.4–49.9)
HGB: 9.9 g/dL — ABNORMAL LOW (ref 13.0–17.1)
LYMPH#: 0.5 10*3/uL — AB (ref 0.9–3.3)
LYMPH%: 4.9 % — ABNORMAL LOW (ref 14.0–49.0)
MCH: 34.7 pg — AB (ref 27.2–33.4)
MCHC: 35.1 g/dL (ref 32.0–36.0)
MCV: 98.8 fL — ABNORMAL HIGH (ref 79.3–98.0)
MONO#: 0.4 10*3/uL (ref 0.1–0.9)
MONO%: 3.9 % (ref 0.0–14.0)
NEUT%: 89.6 % — ABNORMAL HIGH (ref 39.0–75.0)
NEUTROS ABS: 9.1 10*3/uL — AB (ref 1.5–6.5)
Platelets: 384 10*3/uL (ref 140–400)
RBC: 2.86 10*6/uL — ABNORMAL LOW (ref 4.20–5.82)
RDW: 16.3 % — AB (ref 11.0–14.6)
WBC: 10.1 10*3/uL (ref 4.0–10.3)

## 2014-01-20 NOTE — Progress Notes (Signed)
[facility] [phone]   [phone]) [phone]  OFFICE PROGRESS NOTE  Elyn Peers, MD [address] [place] [address]  DIAGNOSIS: Metastatic non-small cell lung cancer, adenocarcinoma with negative EGFR mutation and negative ALK gene translocation diagnosed in December of 2013.   PRIOR THERAPY:  1. Status post Pleurx catheter placement for drainage of right-sided pleural effusion under the care of Dr. Cyndia Bent on 11/29/2012.  2. Systemic chemotherapy with carboplatin for AUC of 6, paclitaxel 200 mg/M2 and Avastin 15 mg/kg every 3 weeks according to the ECoG protocol 5508. Status post 4 cycles.  3. Maintenance chemotherapy according to the ECoG 5508 protocol, patient has been randomized to pemetrexed 500 mg per meter squared given every 3 weeks. Status post 6 cycles.  4. Maintenance chemotherapy with single agent Alimta at 500 mg per meter squared given every 3 weeks status post 2 cycles. Last dose was given 09/16/2013 discontinued today secondary to intolerance. 5. Tarceva 150 mg by mouth daily status post 2 months of treatment, discontinued today secondary to disease progression.  CURRENT THERAPY: None.  CHEMOTHERAPY INTENT: Palliative  CURRENT # OF CHEMOTHERAPY CYCLES: 2 CURRENT ANTIEMETICS: Zofran, dexamethasone and Compazine  CURRENT SMOKING STATUS: Former smoker quit 11/25/2012  ORAL CHEMOTHERAPY AND CONSENT: Tarceva CURRENT BISPHOSPHONATES USE: None  PAIN MANAGEMENT: 0/10  NARCOTICS INDUCED CONSTIPATION: None  LIVING WILL AND CODE STATUS: No CODE BLUE   INTERVAL HISTORY: Jay Watts 53 y.o. male returns to the clinic today for followup visit. The patient tolerated his treatment with Tarceva fairly well with no significant adverse effects. Unfortunately he continues to complain of increasing fatigue and weakness especially in the lower extremities. He had recurrent ascites as well as right-sided pleural effusion that require  ultrasound-guided paracentesis as well as thoracentesis few times recently. The cytology of the peritoneal fluid showed reactive mesothelial cells. The patient still complaining of lack of appetite and early satiety. He continues to have abdominal distention and shortness of breath with exertion. He denied having any significant chest pain, cough or hemoptysis. The patient denied having any nausea or vomiting. He has no fever or chills. He had repeat CT scan of the chest, abdomen and pelvis performed recently and he is here for evaluation and discussion of his scan results.   MEDICAL HISTORY: Past Medical History  Diagnosis Date  . Bipolar 2 disorder   . Shortness of breath     with exertion  . Pneumothorax     right side- spring 2013  . Lung cancer   . Adenocarcinoma, lung 11/30/2012  . Unspecified hypothyroidism 10/10/2013  . Malnutrition of moderate degree 10/08/2013    ALLERGIES:  has No Known Allergies.  MEDICATIONS:  Current Outpatient Prescriptions  Medication Sig Dispense Refill  . erlotinib (TARCEVA) 150 MG tablet Take 1 tablet (150 mg total) by mouth daily. Take on an empty stomach 1 hour before meals or 2 hours after.  30 tablet  1  . lamoTRIgine (LAMICTAL) 150 MG tablet Take 150 mg by mouth at bedtime.      Marland Kitchen levothyroxine (SYNTHROID) 125 MCG tablet Take 1 tablet (125 mcg total) by mouth daily before breakfast.  30 tablet  1  . mirtazapine (REMERON) 45 MG tablet Take 45 mg by mouth at bedtime.      . Multiple Vitamin (MULTIVITAMIN WITH MINERALS) TABS Take 1 tablet by mouth daily.      . polyethylene glycol powder (MIRALAX) powder Take 17 g by mouth daily.  255 g  0  . PRESCRIPTION MEDICATION Inject into the vein every 21 ( twenty-one) days. Alimta infusion q3weeks.      . traZODone (DESYREL) 100 MG tablet Take 1 tablet (100 mg total) by mouth at bedtime as needed for sleep.  30 tablet  2   No current facility-administered medications for this visit.    SURGICAL HISTORY:    Past Surgical History  Procedure Laterality Date  . Turbinate reduction    . Chest tube insertion  11/29/2012    Procedure: INSERTION PLEURAL DRAINAGE CATHETER;  Surgeon: Gaye Pollack, MD;  Location: [facility];  Service: Thoracic;  Laterality: Right;  . Removal of pleural drainage catheter Right 02/14/2013    Procedure: REMOVAL OF PLEURAL DRAINAGE CATHETER;  Surgeon: Gaye Pollack, MD;  Location: [facility];  Service: Thoracic;  Laterality: Right;    REVIEW OF SYSTEMS:  Constitutional: positive for fatigue Eyes: negative Ears, nose, mouth, throat, and face: negative Respiratory: positive for dyspnea on exertion Cardiovascular: negative Gastrointestinal: negative Genitourinary:negative Integument/breast: negative Hematologic/lymphatic: positive for petechiae Musculoskeletal:negative Neurological: negative Behavioral/Psych: negative Endocrine: negative Allergic/Immunologic: negative   PHYSICAL EXAMINATION: General appearance: alert, cooperative, fatigued and no distress Head: Normocephalic, without obvious abnormality, atraumatic Neck: no adenopathy, no JVD, supple, symmetrical, trachea midline and thyroid not enlarged, symmetric, no tenderness/mass/nodules Lymph nodes: Cervical, supraclavicular, and axillary nodes normal. Resp: clear to auscultation bilaterally Back: symmetric, no curvature. ROM normal. No CVA tenderness. Cardio: regular rate and rhythm, S1, S2 normal, no murmur, click, rub or gallop GI: soft, non-tender; very distended with bowel sounds normal; no masses,  no organomegaly Extremities: extremities no edema with redness secondary to venous stasis. Neurologic: Alert and oriented X 3, normal strength and tone. Normal symmetric reflexes. Normal coordination and gait  ECOG PERFORMANCE STATUS: 2 - Symptomatic, <50% confined to bed  Blood pressure 105/72, pulse 119, temperature 96.8 F (36 C), temperature source Oral, resp. rate 20, height _0  (1.905 m), weight 145 lb 4.8  oz (65.908 kg).  LABORATORY DATA: Lab Results  Component Value Date   WBC 10.1 01/20/2014   HGB 9.9* 01/20/2014   HCT 28.3* 01/20/2014   MCV 98.8* 01/20/2014   PLT 384 01/20/2014      Chemistry      Component Value Date/Time   NA 135* 01/01/2014 0949   NA 134* 10/10/2013 0405   K 3.6 01/01/2014 0949   K 3.6 10/10/2013 0405   CL 101 10/10/2013 0405   CL 98 06/03/2013 0914   CO2 27 01/01/2014 0949   CO2 22 10/10/2013 0405   BUN 7.3 01/01/2014 0949   BUN 9 10/10/2013 0405   CREATININE 0.9 01/01/2014 0949   CREATININE 1.47* 10/10/2013 0405      Component Value Date/Time   CALCIUM 8.8 01/01/2014 0949   CALCIUM 8.3* 10/10/2013 0405   ALKPHOS 100 01/01/2014 0949   ALKPHOS 65 10/08/2013 0425   AST 28 01/01/2014 0949   AST 33 10/08/2013 0425   ALT 13 01/01/2014 0949   ALT 12 10/08/2013 0425   BILITOT 0.28 01/01/2014 0949   BILITOT 0.7 10/08/2013 0425       RADIOGRAPHIC STUDIES:  Dg Chest 2 View  01/03/2014   CLINICAL DATA:  Shortness of breath.  EXAM: CHEST  2 VIEW  COMPARISON:  Chest x-ray 12/06/2013.  FINDINGS: Mediastinum and hilar structures are normal. Progressive bilateral pleural effusions are present. Right upper lobe atelectasis and/or scarring. Cardiovascular structures stable. No acute osseous abnormality.  IMPRESSION: 1. Progressive bilateral pleural effusions.  2. Right upper lobe atelectasis and/or scarring.   Electronically Signed   By: Marcello Moores  Register   On: 01/03/2014 10:20   Ct Chest W Contrast  01/17/2014   CLINICAL DATA:  Right-sided chest pain. Cough and shortness of breath. History of lung cancer.  EXAM: CT CHEST, ABDOMEN, AND PELVIS WITH CONTRAST  TECHNIQUE: Multidetector CT imaging of the chest, abdomen and pelvis was performed following the standard protocol during bolus administration of intravenous contrast.  CONTRAST:  115m OMNIPAQUE IOHEXOL 300 MG/ML  SOLN  COMPARISON:  08/23/2013, and 10/23/2013  FINDINGS:   CT CHEST FINDINGS  There is a a moderate volume, partially  loculated right pleural effusion which appears increased from previous exam. Simple appearing left pleural effusion is moderate in volume and is new from the previous study. Pleural tumor versus rounded atelectasis is noted within the right lower lobe. On today's examination this measures 4.4 cm, image 46/series 2. Previously this measured 3.4 cm. Right apical tumor measures 4.8 cm, image 11/ series 2. Previously 4.5 cm. Perihilar tumor measures 3.2 x 2.2 cm, image 18/series 2. Similar appearance of radiation change within the right lung.  The trachea appears patent and is midline. Pre vascular lymph node measures 2.4 cm, image 18/series 2. Previously this measured 0.7 cm. There is a AP window lymph node which measures 1.8 cm, image 25/series 2. Previously this measured 1 cm. The sub- carinal lymph node measures 1.9 cm, image 29/series 2. Previously 1.1 cm.  There is a new enlarged left axillary lymph node which measures 2.7 cm, image 11/series 2. There is a new subcutaneous nodule within the right axilla which measures 1.5 cm, image 12/series 2.  Review of the visualized osseous structures is negative for lytic or sclerotic bone lesion.    CT ABDOMEN AND PELVIS FINDINGS  There is a new low attenuation structure within the dome of liver measuring 1.9 cm, image 54/series 2. Normal appearance of the pancreas. The spleen is normal. The adrenal glands are both within normal limits. New left adrenal enlargement measures 2.8 by 1.0 cm, image 64/series 2. The right adrenal gland appears normal. Normal appearance of both kidneys. The urinary bladder is not well visualized. Prostate gland and seminal vesicles are unremarkable.  Normal caliber of the abdominal aorta. Enlarged gastrohepatic ligament lymph node is new from previous exam and measures 1.8 cm, image 64/series 2. There is no enlarged pelvic or inguinal lymph nodes. There is a moderate to marked amount of ascites identified within the abdomen and pelvis.  Peritoneal nodule within the left abdomen measures 2.4 cm, image 60/series 2. This is new from previous exam. Peritoneal nodule within the left pericolic gutter measures 9 mm and is also new from previous exam, image 99/series 2.    IMPRESSION: CT chest:  1. Interval progression of thoracic tumor. 2. Interval progression of thoracic adenopathy. 3. Increased in volume of bilateral pleural effusions. 4. Pleural tumor versus rounded atelectasis in the right lower lobe has increased in size from previous exam. 5. Right apical and right perihilar tumor have increased in the interval.  CT abdomen and pelvis:  1. Large volume of ascites is increased in volume from previous exam. 2. New peritoneal nodules 3. New liver metastasis 4. Enlarged gastrohepatic ligament lymph node compatible with metastatic adenopathy 5. Interval increase in size of left adrenal gland which is worrisome for adrenal metastasis.   Electronically Signed   By: TKerby MoorsM.D.   On: 01/17/2014 11:46   UKoreaParacentesis  01/17/2014  CLINICAL DATA:  Metastatic lung carcinoma, recurrent ascites, dyspnea. Request is made for diagnostic and therapeutic paracentesis.  EXAM: ULTRASOUND GUIDED DIAGNOSTIC AND THERAPEUTIC PARACENTESIS  COMPARISON:  PRIOR PARACENTESIS 01/03/2014  PROCEDURE: An ultrasound guided paracentesis was thoroughly discussed with the patient and questions answered. The benefits, risks, alternatives and complications were also discussed. The patient understands and wishes to proceed with the procedure. Written consent was obtained.  Ultrasound was performed to localize and mark an adequate pocket of fluid in the right lower. quadrant of the abdomen. The area was then prepped and draped in the normal sterile fashion. 1% Lidocaine was used for local anesthesia. Under ultrasound guidance a 19 gauge Yueh catheter was introduced. Paracentesis was performed. The catheter was removed and a dressing applied.  COMPLICATIONS: None immediate   FINDINGS: A total of approximately 3.5 liters. of yellow. fluid was removed. A fluid sample was sent for cytology.  IMPRESSION: Successful ultrasound guided diagnostic and therapeutic paracentesis yielding 3.5 liters. of ascites.  Read by: Rowe Robert ,P.A.-C.   Electronically Signed   By: Sandi Mariscal M.D.   On: 01/17/2014 13:52   US Paracentesis  01/03/2014   CLINICAL DATA:  Metastatic lung carcinoma, recurrent ascites. Request is made for diagnostic and therapeutic paracentesis  EXAM: ULTRASOUND GUIDED DIAGNOSTIC AND THERAPEUTIC PARACENTESIS  COMPARISON:  PREVIOUS PARACENTESIS ON 12/20/2013.  PROCEDURE: An ultrasound guided paracentesis was thoroughly discussed with the patient and questions answered. The benefits, risks, alternatives and complications were also discussed. The patient understands and wishes to proceed with the procedure. Written consent was obtained.  Ultrasound was performed to localize and mark an adequate pocket of fluid in the left lower. quadrant of the abdomen. The area was then prepped and draped in the normal sterile fashion. 1% Lidocaine was used for local anesthesia. Under ultrasound guidance a 19 gauge Yueh catheter was introduced. Paracentesis was performed. The catheter was removed and a dressing applied.  COMPLICATIONS: None immediate  FINDINGS: A total of approximately 3.9 liters. of slightly turbid, amber. fluid was removed. A fluid sample was sent for laboratory analysis.  IMPRESSION: Successful ultrasound guided diagnostic and therapeutic paracentesis yielding 3.9 liters of ascites.  Read by: Rowe Robert ,P.A.-C.   Electronically Signed   By: Maryclare Bean M.D.   On: 01/03/2014 12:34   ASSESSMENT AND PLAN: This is a very pleasant 53 years old white male with metastatic non-small cell lung cancer, adenocarcinoma status post 6 cycles of induction chemotherapy was carboplatin, paclitaxel and Avastin followed by 8 cycles of maintenance Alimta discontinued secondary to disease  progression. This was followed by 2 months treatment with Tarceva 150 mg by mouth daily but unfortunately the patient continues to have evidence for significant disease progression in the chest and abdomen. He continues to complain of increasing fatigue and weakness as well as declining of his general condition. I have a lengthy discussion with the patient today about his current disease status and treatment options. I gave the patient the option of palliative care and hospice referral versus consideration of systemic therapy with either single agent docetaxel for consideration of enrollment in the immunotherapy clinical trial with Nivolumab. The patient is very reluctant to consider any further treatment at this point and would like to consider palliative care and hospice referral I will refer him to the palliative care and hospice of [facility]. I would see the patient on as-needed basis at this point but he knows to call me immediately if he has any concerning symptoms. The patient voices understanding  of current disease status and treatment options and is in agreement with the current care plan. All questions were answered. The patient knows to call the clinic with any problems, questions or concerns. We can certainly see the patient much sooner if necessary.  I spent 15 minutes counseling the patient face to face. The total time spent in the appointment was 25 minutes.

## 2014-01-20 NOTE — Progress Notes (Signed)
01/20/14 Met briefly with Jay Watts this morning to complete PROs for the BMS 118 observational trial. Questionnaires were completed independently by Mr. Jay Watts. He is seeing the MD today to discuss the result of his latest CT scans.   Received a message from Dr. Worthy Flank nurse that he was referred to Hospice after his MD appointment.

## 2014-01-20 NOTE — Patient Instructions (Signed)
Smoking Cessation Quitting smoking is important to your health and has many advantages. However, it is not always easy to quit since nicotine is a very addictive drug. Often times, people try 3 times or more before being able to quit. This document explains the best ways for you to prepare to quit smoking. Quitting takes hard work and a lot of effort, but you can do it. ADVANTAGES OF QUITTING SMOKING  You will live longer, feel better, and live better.  Your body will feel the impact of quitting smoking almost immediately.  Within 20 minutes, blood pressure decreases. Your pulse returns to its normal level.  After 8 hours, carbon monoxide levels in the blood return to normal. Your oxygen level increases.  After 24 hours, the chance of having a heart attack starts to decrease. Your breath, hair, and body stop smelling like smoke.  After 48 hours, damaged nerve endings begin to recover. Your sense of taste and smell improve.  After 72 hours, the body is virtually free of nicotine. Your bronchial tubes relax and breathing becomes easier.  After 2 to 12 weeks, lungs can hold more air. Exercise becomes easier and circulation improves.  The risk of having a heart attack, stroke, cancer, or lung disease is greatly reduced.  After 1 year, the risk of coronary heart disease is cut in half.  After 5 years, the risk of stroke falls to the same as a nonsmoker.  After 10 years, the risk of lung cancer is cut in half and the risk of other cancers decreases significantly.  After 15 years, the risk of coronary heart disease drops, usually to the level of a nonsmoker.  If you are pregnant, quitting smoking will improve your chances of having a healthy baby.  The people you live with, especially any children, will be healthier.  You will have extra money to spend on things other than cigarettes. QUESTIONS TO THINK ABOUT BEFORE ATTEMPTING TO QUIT You may want to talk about your answers with your  caregiver.  Why do you want to quit?  If you tried to quit in the past, what helped and what did not?  What will be the most difficult situations for you after you quit? How will you plan to handle them?  Who can help you through the tough times? Your family? Friends? A caregiver?  What pleasures do you get from smoking? What ways can you still get pleasure if you quit? Here are some questions to ask your caregiver:  How can you help me to be successful at quitting?  What medicine do you think would be best for me and how should I take it?  What should I do if I need more help?  What is smoking withdrawal like? How can I get information on withdrawal? GET READY  Set a quit date.  Change your environment by getting rid of all cigarettes, ashtrays, matches, and lighters in your home, car, or work. Do not let people smoke in your home.  Review your past attempts to quit. Think about what worked and what did not. GET SUPPORT AND ENCOURAGEMENT You have a better chance of being successful if you have help. You can get support in many ways.  Tell your family, friends, and co-workers that you are going to quit and need their support. Ask them not to smoke around you.  Get individual, group, or telephone counseling and support. Programs are available at local hospitals and health centers. Call your local health department for   information about programs in your area.  Spiritual beliefs and practices may help some smokers quit.  Download a "quit meter" on your computer to keep track of quit statistics, such as how long you have gone without smoking, cigarettes not smoked, and money saved.  Get a self-help book about quitting smoking and staying off of tobacco. LEARN NEW SKILLS AND BEHAVIORS  Distract yourself from urges to smoke. Talk to someone, go for a walk, or occupy your time with a task.  Change your normal routine. Take a different route to work. Drink tea instead of coffee.  Eat breakfast in a different place.  Reduce your stress. Take a hot bath, exercise, or read a book.  Plan something enjoyable to do every day. Reward yourself for not smoking.  Explore interactive web-based programs that specialize in helping you quit. GET MEDICINE AND USE IT CORRECTLY Medicines can help you stop smoking and decrease the urge to smoke. Combining medicine with the above behavioral methods and support can greatly increase your chances of successfully quitting smoking.  Nicotine replacement therapy helps deliver nicotine to your body without the negative effects and risks of smoking. Nicotine replacement therapy includes nicotine gum, lozenges, inhalers, nasal sprays, and skin patches. Some may be available over-the-counter and others require a prescription.  Antidepressant medicine helps people abstain from smoking, but how this works is unknown. This medicine is available by prescription.  Nicotinic receptor partial agonist medicine simulates the effect of nicotine in your brain. This medicine is available by prescription. Ask your caregiver for advice about which medicines to use and how to use them based on your health history. Your caregiver will tell you what side effects to look out for if you choose to be on a medicine or therapy. Carefully read the information on the package. Do not use any other product containing nicotine while using a nicotine replacement product.  RELAPSE OR DIFFICULT SITUATIONS Most relapses occur within the first 3 months after quitting. Do not be discouraged if you start smoking again. Remember, most people try several times before finally quitting. You may have symptoms of withdrawal because your body is used to nicotine. You may crave cigarettes, be irritable, feel very hungry, cough often, get headaches, or have difficulty concentrating. The withdrawal symptoms are only temporary. They are strongest when you first quit, but they will go away within  10 14 days. To reduce the chances of relapse, try to:  Avoid drinking alcohol. Drinking lowers your chances of successfully quitting.  Reduce the amount of caffeine you consume. Once you quit smoking, the amount of caffeine in your body increases and can give you symptoms, such as a rapid heartbeat, sweating, and anxiety.  Avoid smokers because they can make you want to smoke.  Do not let weight gain distract you. Many smokers will gain weight when they quit, usually less than 10 pounds. Eat a healthy diet and stay active. You can always lose the weight gained after you quit.  Find ways to improve your mood other than smoking. FOR MORE INFORMATION  www.smokefree.gov  Document Released: 11/29/2001 Document Revised: 06/05/2012 Document Reviewed: 03/15/2012 [employer] Patient Information 2014 [employer].  

## 2014-01-20 NOTE — Progress Notes (Signed)
Hospice referral called into [facility].  SLJ

## 2014-01-20 NOTE — Telephone Encounter (Signed)
Pt has requested that hospice not come out to the house until feb 17th.  He states he needs a little time before they come out.  Informed Dr Vista Mink.  Hospice will call back when they plan on going to his home.  SLJ

## 2014-01-22 NOTE — Telephone Encounter (Signed)
erroneous

## 2014-02-03 ENCOUNTER — Telehealth: Payer: Self-pay | Admitting: Medical Oncology

## 2014-02-03 NOTE — Telephone Encounter (Signed)
Referred by Dr Julien Nordmann on Jan 20, 2014 and pt was not agreeing to referral . Now pt agrees. Okay per Dr Julien Nordmann.

## 2014-02-04 ENCOUNTER — Other Ambulatory Visit: Payer: Self-pay | Admitting: *Deleted

## 2014-02-04 ENCOUNTER — Telehealth: Payer: Self-pay | Admitting: *Deleted

## 2014-02-04 DIAGNOSIS — C3490 Malignant neoplasm of unspecified part of unspecified bronchus or lung: Secondary | ICD-10-CM

## 2014-02-04 MED ORDER — MORPHINE SULFATE ER 15 MG PO TBCR
15.0000 mg | EXTENDED_RELEASE_TABLET | Freq: Two times a day (BID) | ORAL | Status: AC
Start: 2014-02-04 — End: ?

## 2014-02-04 MED ORDER — SENNOSIDES 8.6 MG PO TABS: 2.0000 | ORAL_TABLET | Freq: Every day | ORAL | Status: DC

## 2014-02-04 MED ORDER — SENNOSIDES 8.6 MG PO TABS: 2.0000 | ORAL_TABLET | Freq: Every day | ORAL | Status: AC

## 2014-02-04 MED ORDER — MORPHINE SULFATE 15 MG PO TABS: 15.0000 mg | ORAL_TABLET | ORAL | Status: AC | PRN

## 2014-02-04 NOTE — Telephone Encounter (Signed)
Jeani Hawking called reporting patient is having pain at rate of ten out of ten on pain scale.  Only has OTC pain medicines.  Pain is to his hips, legs, knees with right greater than the left.  Also needs senna-S because he doesn't have anything for bowels.  Uses [facility] on [address]. Near [address]. [place]. [phone].  Will notify providers.

## 2014-02-04 NOTE — Telephone Encounter (Signed)
Luanne Bras with orders signed by Dr. Julien Nordmann.  Reports Hospice has symptom management.  Will follow Dr. Worthy Flank orders today.  If pain progresses Hospice is considering Roxanol.

## 2014-02-11 ENCOUNTER — Telehealth: Payer: Self-pay | Admitting: *Deleted

## 2014-02-11 NOTE — Telephone Encounter (Signed)
Maura RN called reporting this patient has poor O2 sat and lethargy so may be transitioning.  Would like to switch to liquid morphine.  Patient has 30-day supply of Tarceva 150 mg dose the family wouldl ike to discard of.  Verbal order received and read back from Dr. Julien Nordmann for patient to switch to liquid morphine as ordered by Hospice.  They also may bring Tarceva to our office to discard.  Voicemail left for Maura with this information.

## 2014-02-16 DEATH — deceased

## 2014-08-12 ENCOUNTER — Other Ambulatory Visit: Payer: Self-pay | Admitting: Pharmacist

## 2015-05-14 IMAGING — CT CT CHEST W/ CM
2 of 3 series · 15 of 36 positions shown, 18 images · IV contrast (omnipaque)
Comparison: 06/22/2013

CLINICAL DATA: Lung cancer, chemotherapy ongoing, RECIST protocol

CT CHEST WITH CONTRAST
TECHNIQUE: Multidetector CT imaging of the chest was performed
following the standard protocol during bolus administration of
intravenous contrast.
Contrast: 80mL OMNIPAQUE IOHEXOL 300 MG/ML  SOLN

[Series 2: chest with st · axial · 0.76mm/px · z∈[+1004,+1319]mm · 12 of 75 slices shown, 15 images]
[im 6/75  mediastinal]
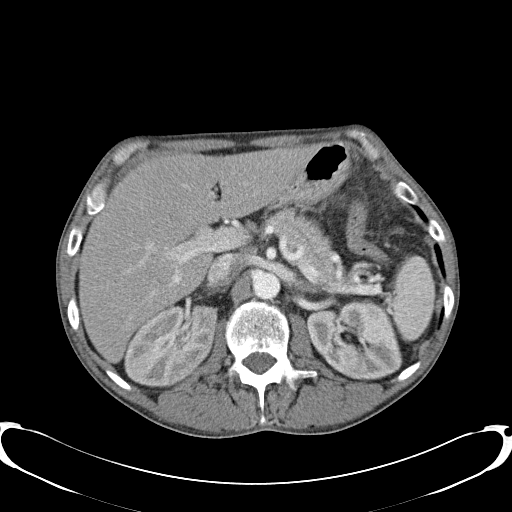
[im 6/75  lung]
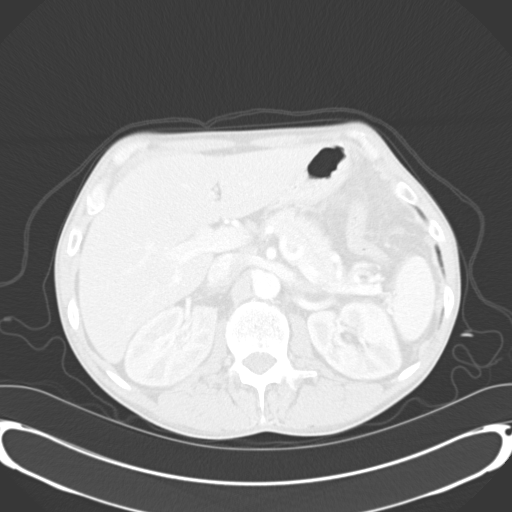
[im 11/75  lung]
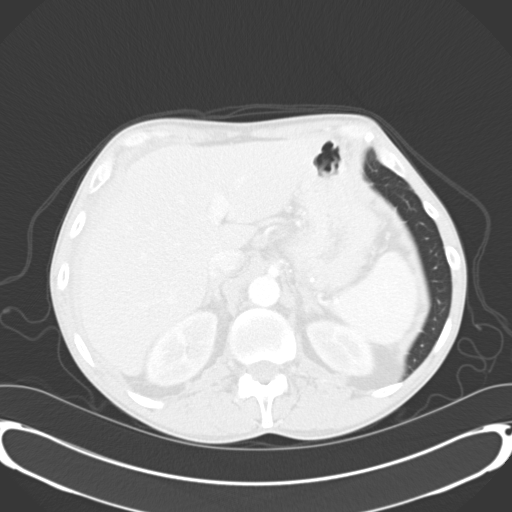
[im 17/75  lung]
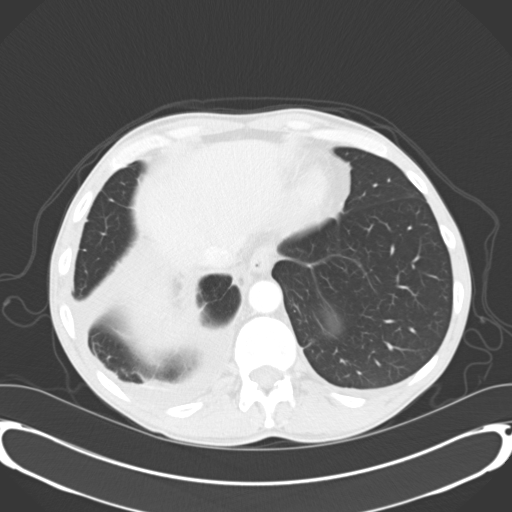
[im 22/75  lung]
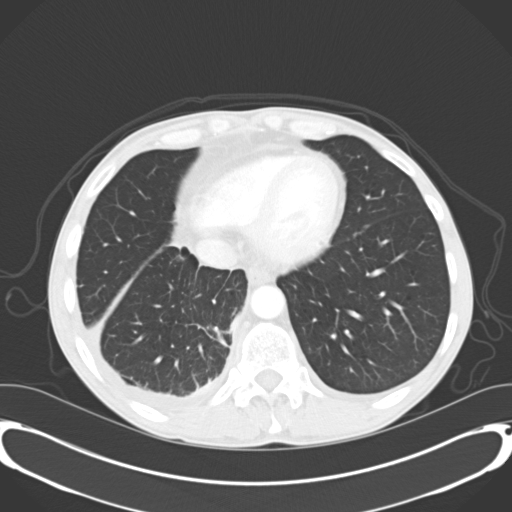
[im 28/75  mediastinal]
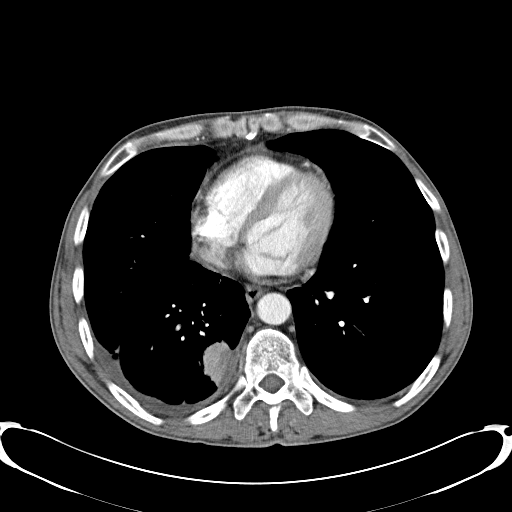
[im 28/75  lung]
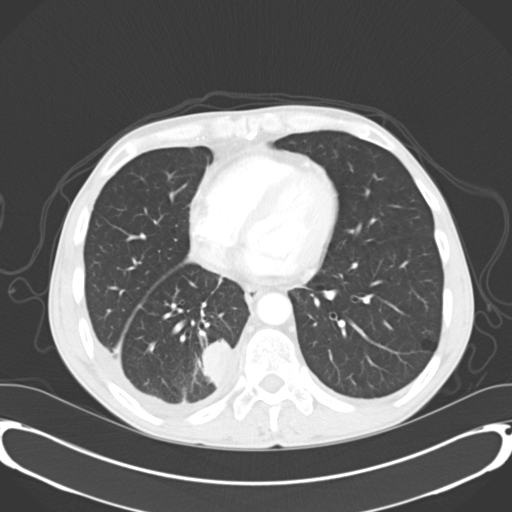
[im 33/75  lung]
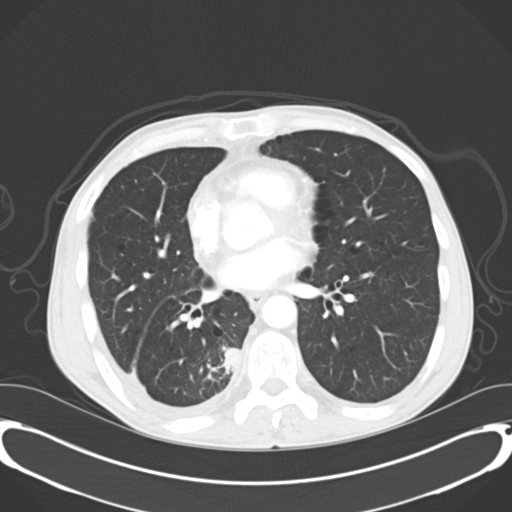
[im 42/75  lung]
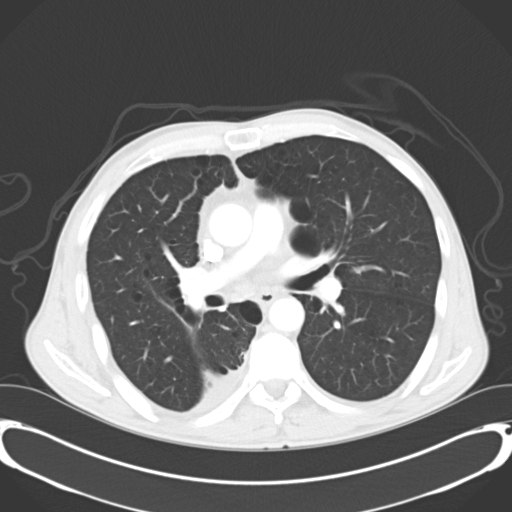
[im 47/75  lung]
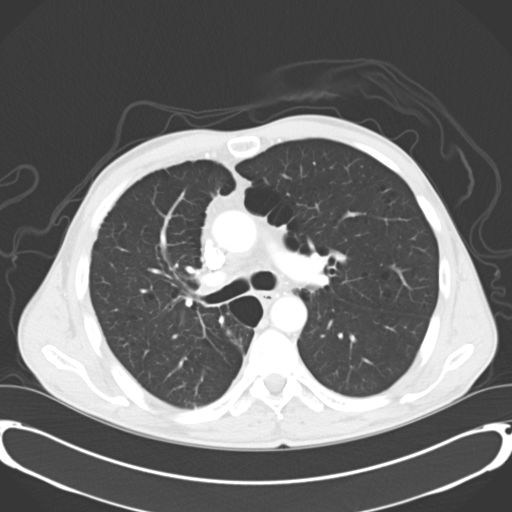
[im 53/75  mediastinal]
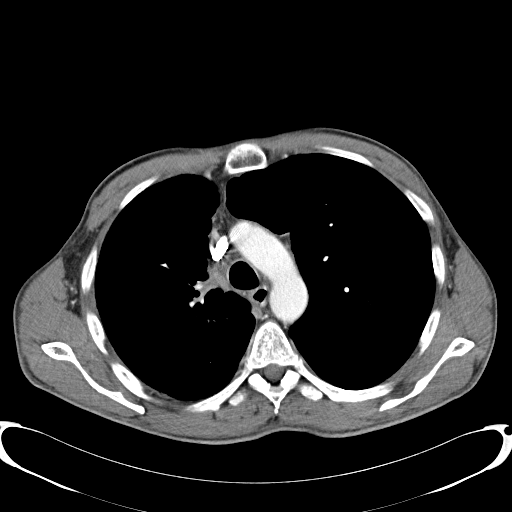
[im 53/75  lung]
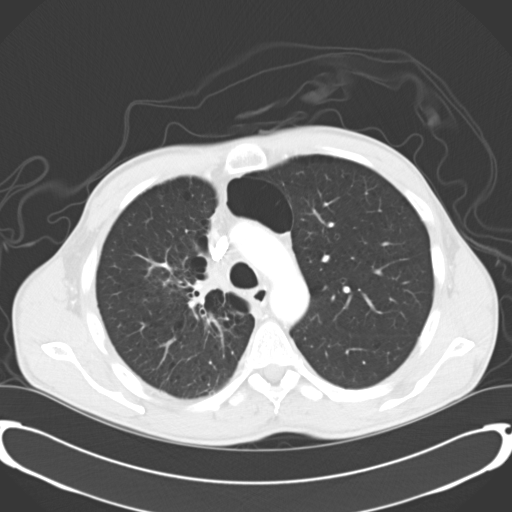
[im 58/75  lung]
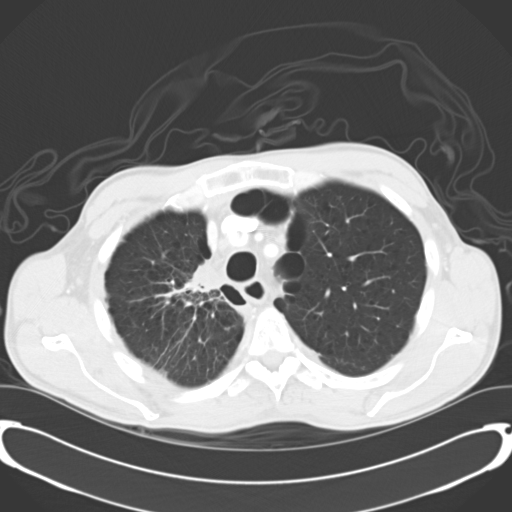
[im 64/75  lung]
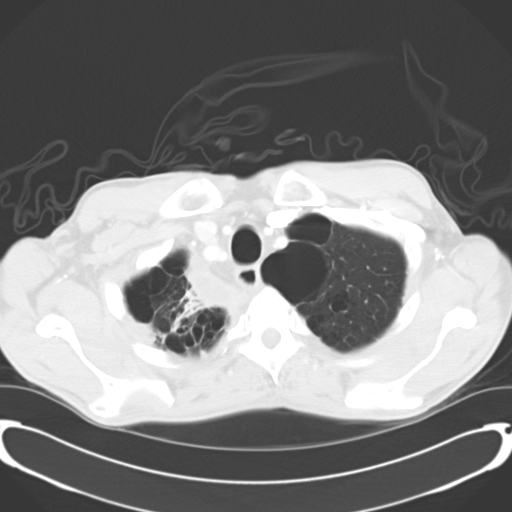
[im 69/75  lung]
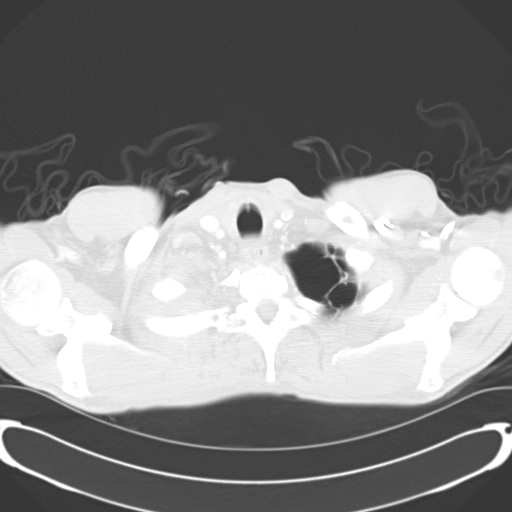

[Series 602: <mpr thick range> · coronal · 0.76mm/px · 3 of 84 slices shown]
[im 17/84  lung]
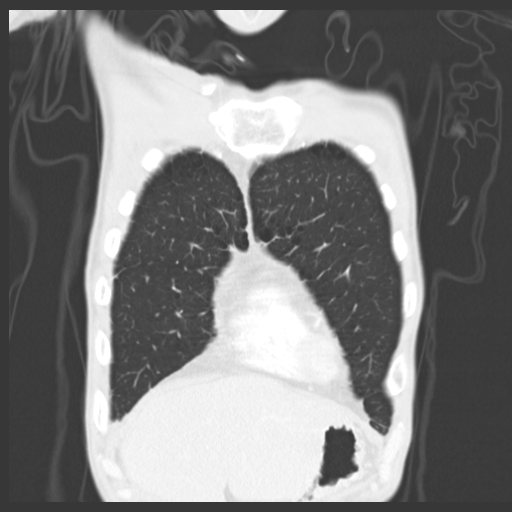
[im 34/84  lung]
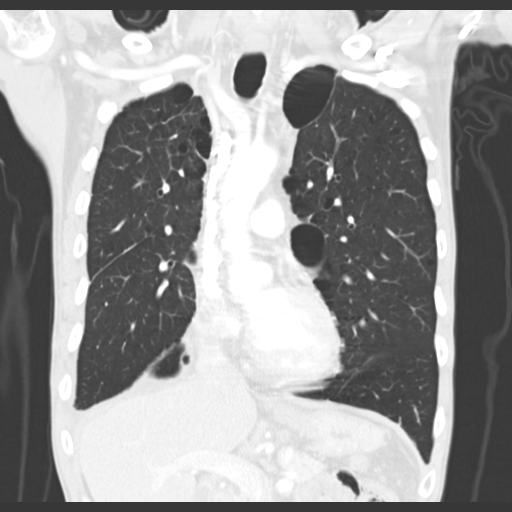
[im 50/84  lung]
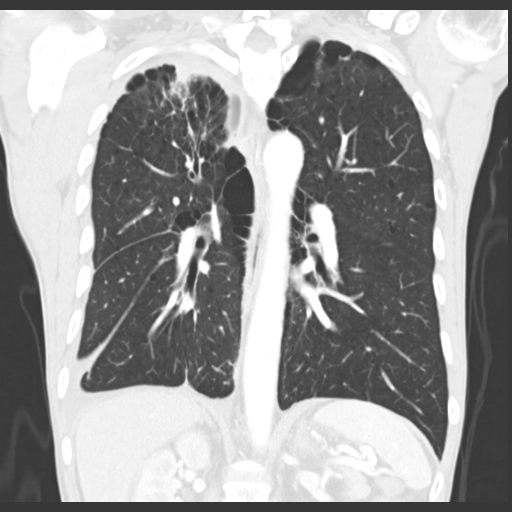

[15 of 36 positions shown; findings below may reference images not displayed]

---

RECIST

Target Lesions:

1. Right lower lobe lesion - difficult to discretely measure but
approximately 19 x 11 mm (series 2/image 50), previously 15 x 10 mm

2. Pretracheal lymph node - measures 15 x 10 mm (series 2/image
26), previously 14 x 12 mm

Non-target Lesions:

1. Subcarinal lymph node - present, measures 11 mm short axis
(series 2/image 32), previously 12 mm

2. Right hilar lymph node - less conspicuous but likely present,
measures 5 mm short axis (series 2/image 30), unchanged

3. Right anterior mediastinal lymph node - present, measures 6 mm
short axis (series 2/image 21), unchanged

4. Right pleural effusion - present, small, mildly increased

---
FINDINGS: 4.5 x 2.7 cm thick-walled fluid collection in the medial
right lung apex (series 2/image 11), decreased, likely
infectious/inflammatory.  Associated gas within the lesion has
resolved.  Surrounding spiculation/ground-glass opacity has
significantly improved (series 5/image 18).

Small right pleural effusion, mildly increased.  Associated rounded
atelectasis with ill-defined 1.9 x 1.1 cm enhancing pleural-based
lesion medially (series 2/image 50), previously 1.5 x 1.0 cm.

Underlying moderate paraseptal emphysematous changes.  No
pneumothorax.

The heart is normal in size.  No pericardial effusion.

Mediastinal lymphadenopathy, grossly unchanged, including:
--6 mm short-axis anterior mediastinal node (series 2/image 21),
unchanged
--6 mm short-axis high right paratracheal node (series 2/image 22),
previously 7 mm
--10 mm short axis precarinal node (series 2/image 26), previously
12 mm
--10 mm short-axis AP window node (series 2/image 27), previously
12 mm
--11 mm short-axis subcarinal node (series 2/image 32), previously
12 mm

Visualized upper abdomen is unremarkable.

Mild degenerative changes of the visualized thoracolumbar spine.
IMPRESSION: 19 x 11 mm enhancing pleural-based lesion at the right lung base
with associated rounded atelectasis, possibly mild increased.
Attention on follow-up suggested.

Mild mediastinal lymphadenopathy, grossly unchanged.

Improving 4.5 x 2.7 cm thick wall fluid collection in the medial
right lung apex, likely infectious/inflammatory.

RECIST 1.1 measurements as above.

## 2015-07-21 IMAGING — CR DG CHEST 1V
1 series · 1 of 1 positions shown · non-contrast
Comparison: 10/23/2013 CT and [DATE] chest x-ray.

CLINICAL DATA: Post left-sided thoracentesis.  Lung cancer.

EXAM:
CHEST - 1 VIEW

[w chest pa]
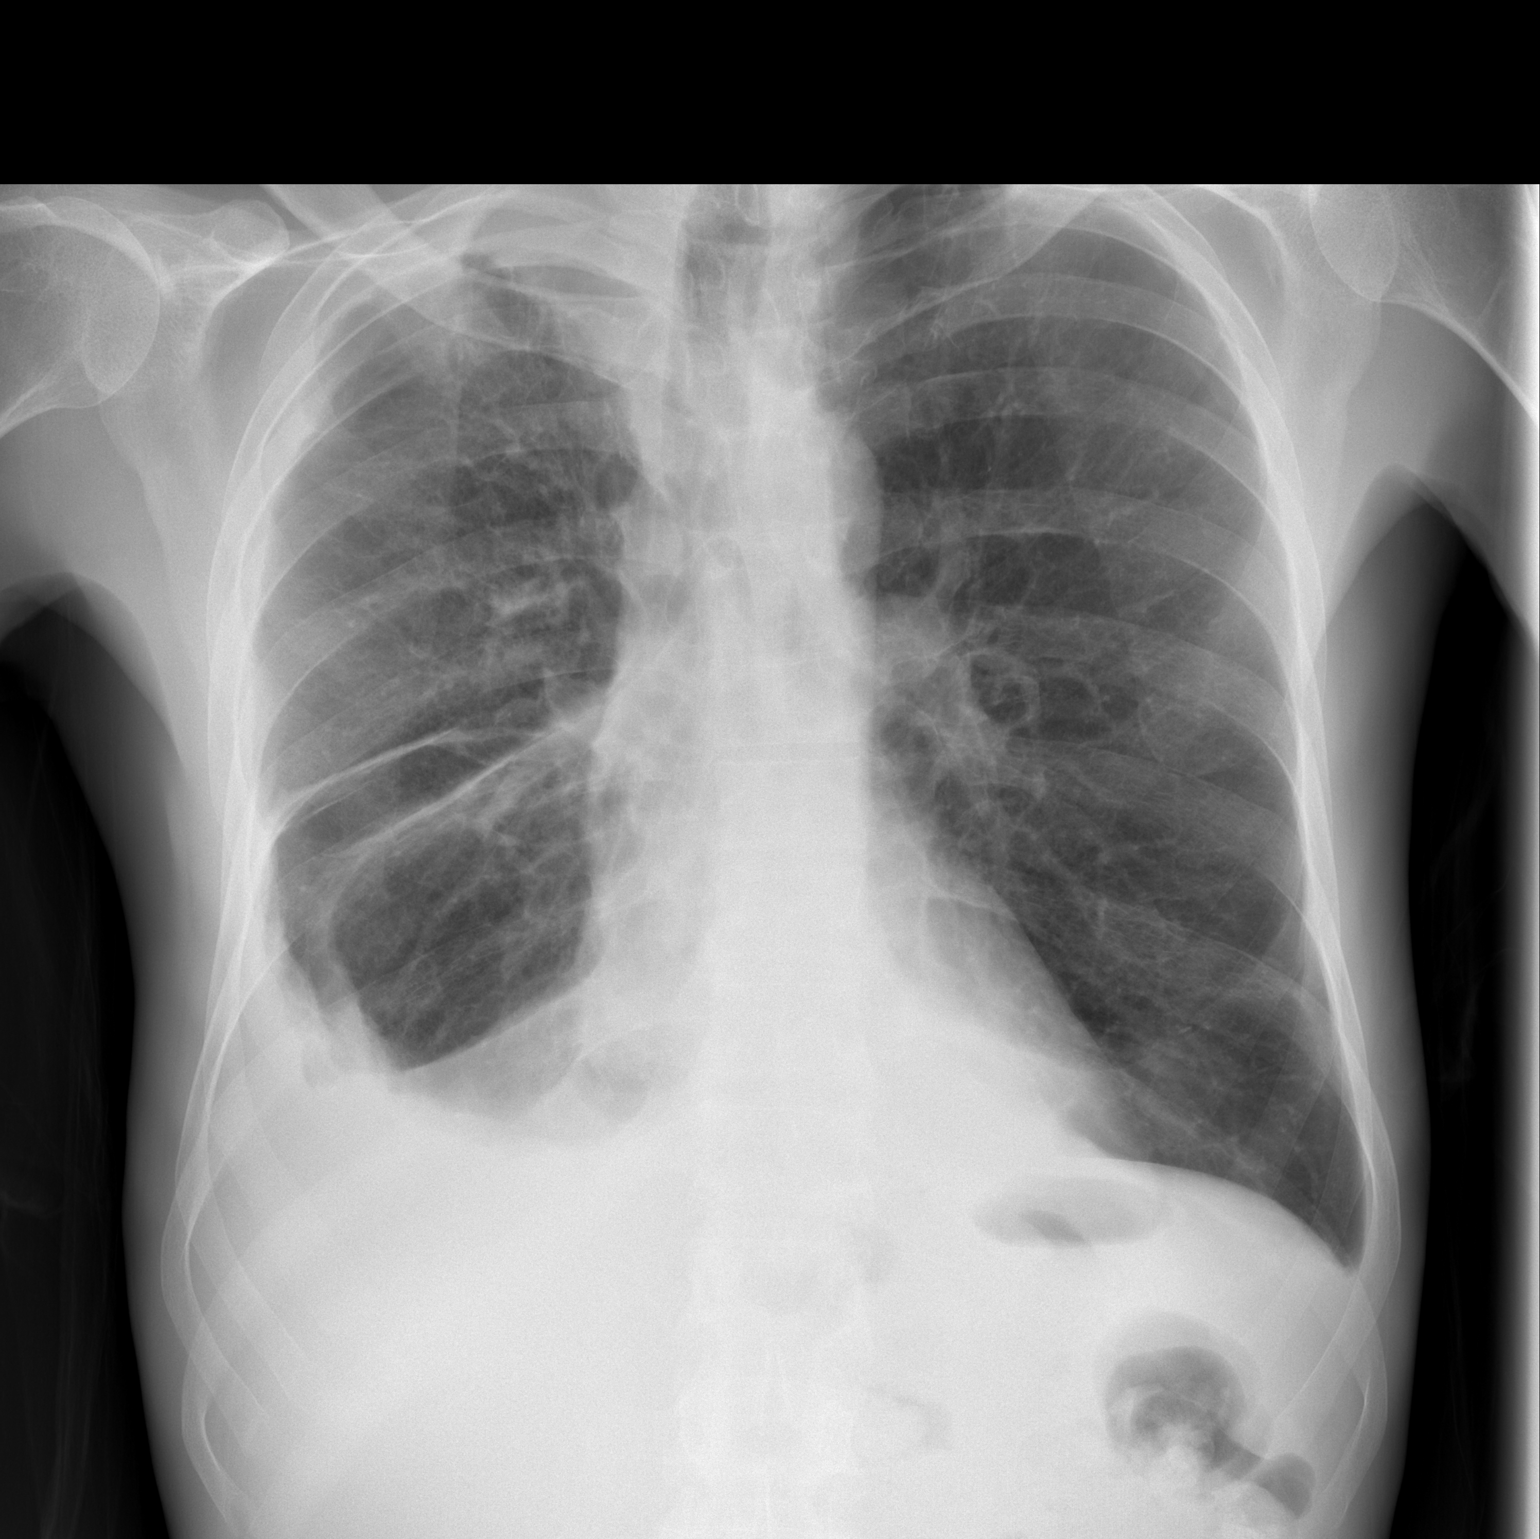

[1 of 1 positions shown; findings below may reference images not displayed]

FINDINGS: No pneumothorax detected post thoracentesis.

Right-sided pleural effusion/pleural thickening and possible
underlying recurrent tumor (best noted on CT).
IMPRESSION: No evidence of pneumothorax post thoracentesis.

## 2015-07-21 IMAGING — US US THORACENTESIS ASP PLEURAL SPACE W/IMG GUIDE
1 series · 6 of 6 positions shown · non-contrast
Comparison: CT scan of the chest from 10/23/2013

CLINICAL DATA: Metastatic lung cancer, bilateral pleural effusions.
Request thoracentesis.

EXAM:
ULTRASOUND GUIDED left THORACENTESIS

[Series 1: us thoracentesis asp pleural space w/img guide · 0.28mm/px · 6 of 6 slices shown]
[im 1/6]
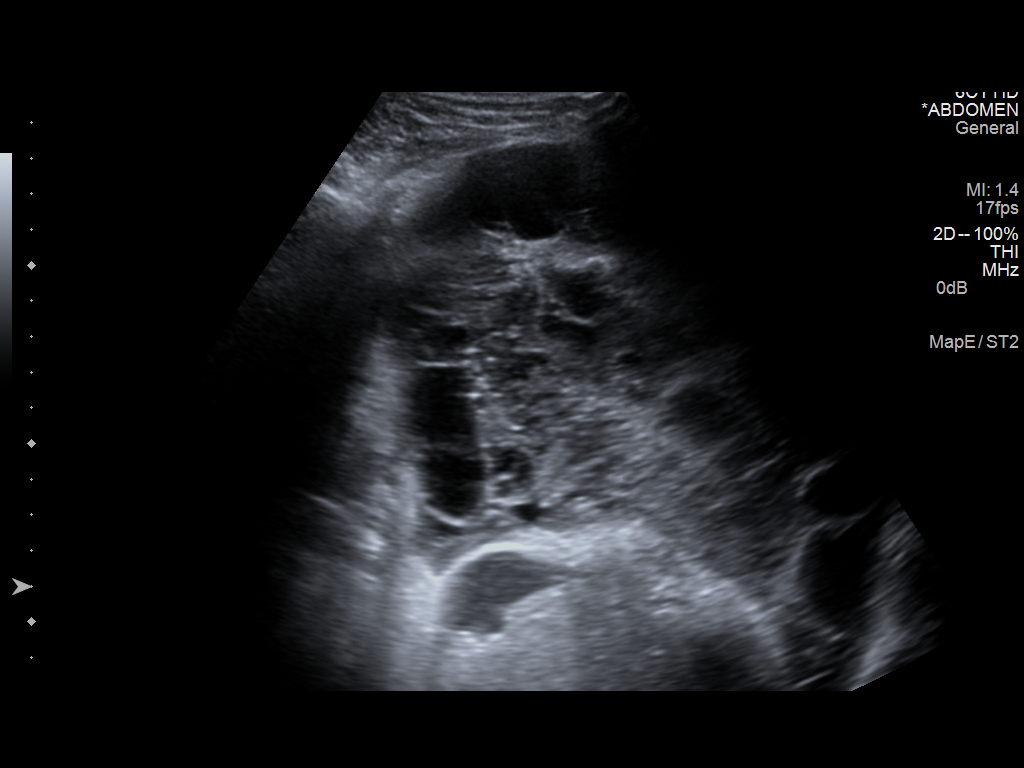
[im 2/6]
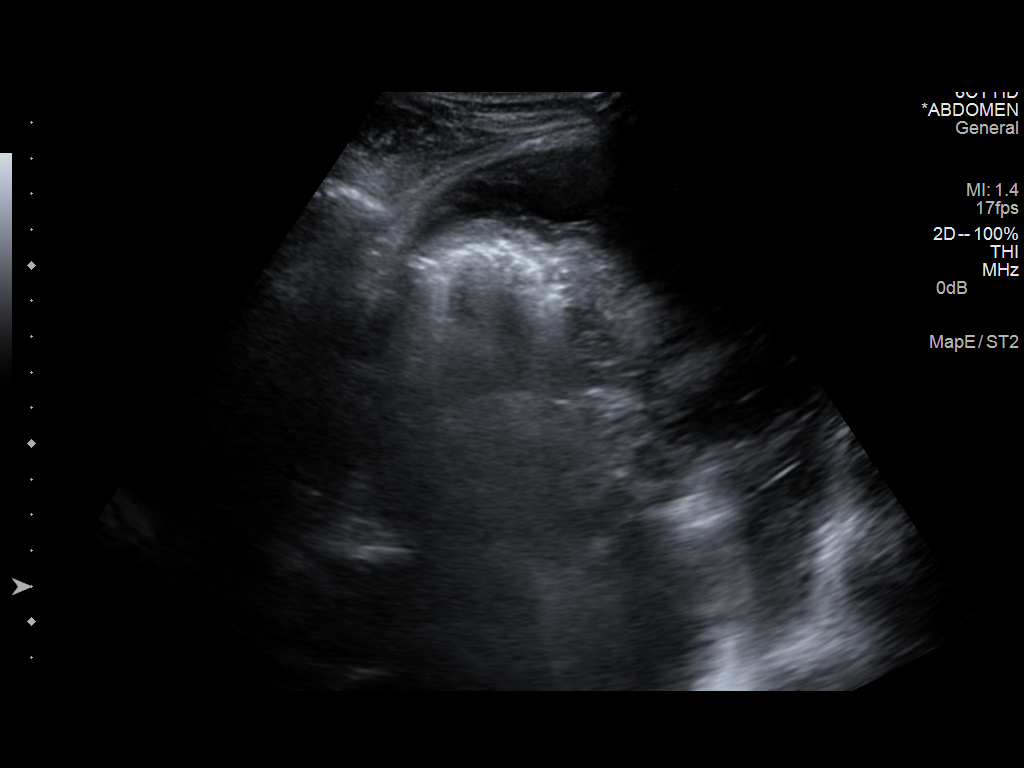
[im 3/6]
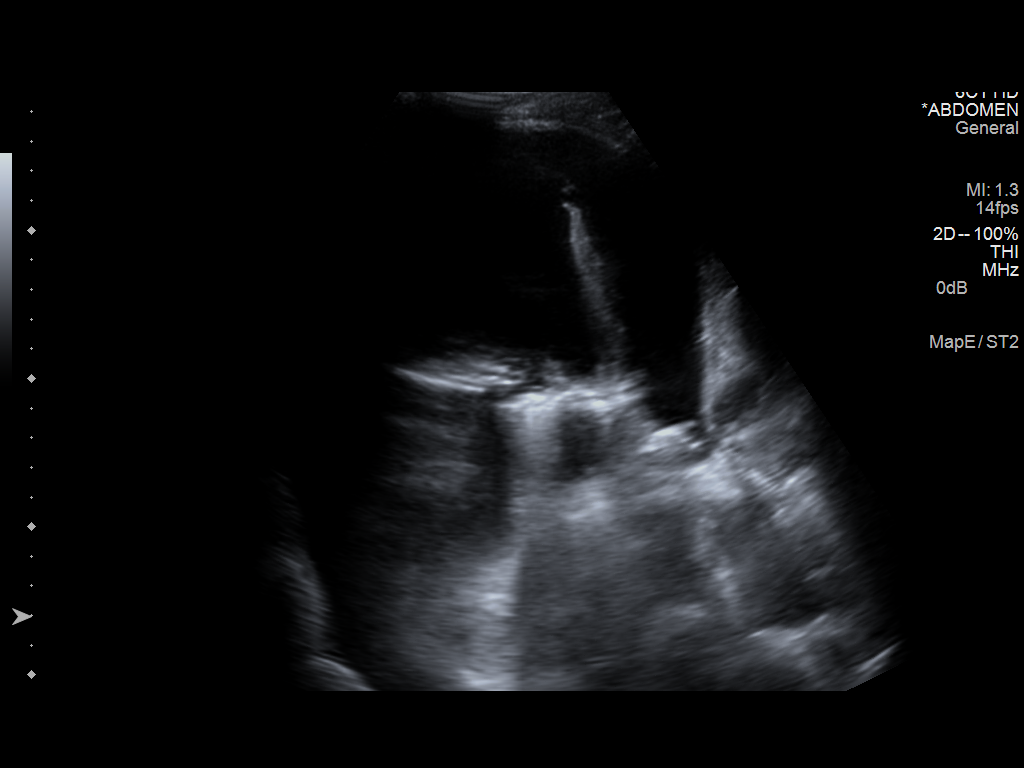
[im 4/6]
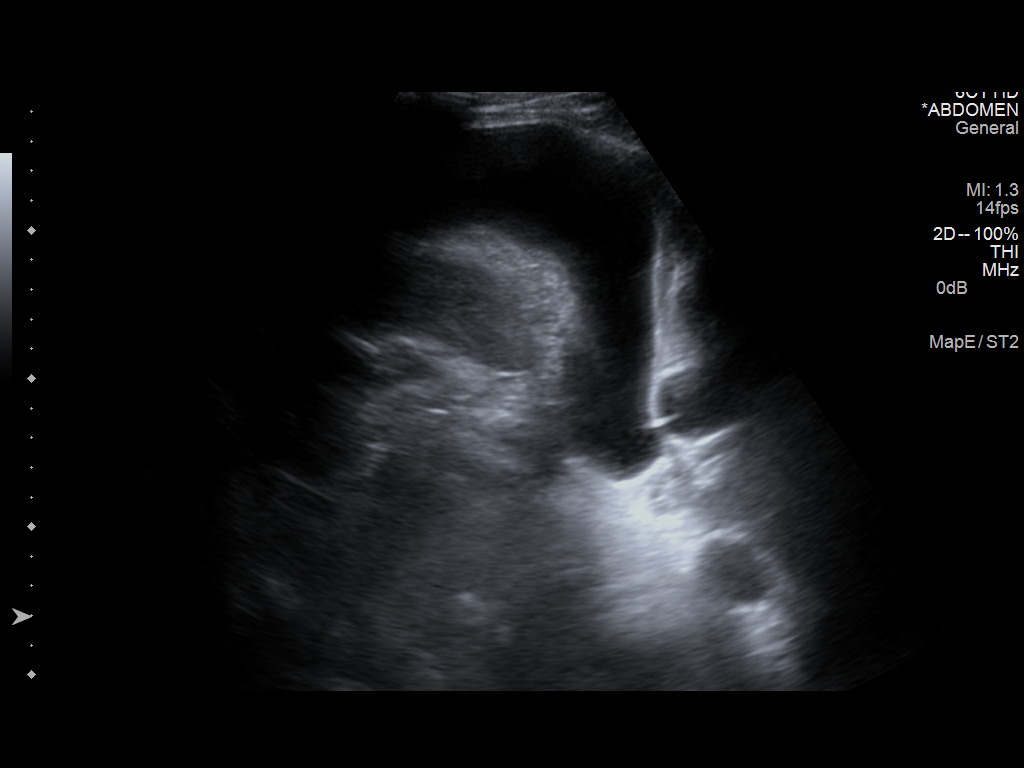
[im 5/6]
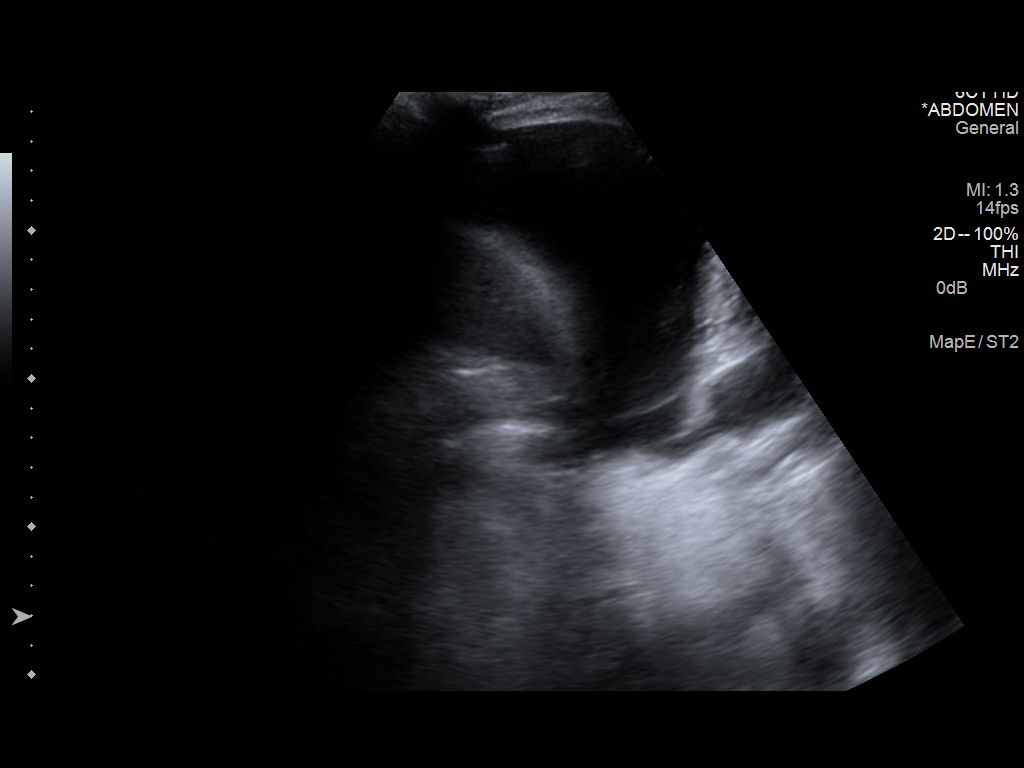
[im 6/6]
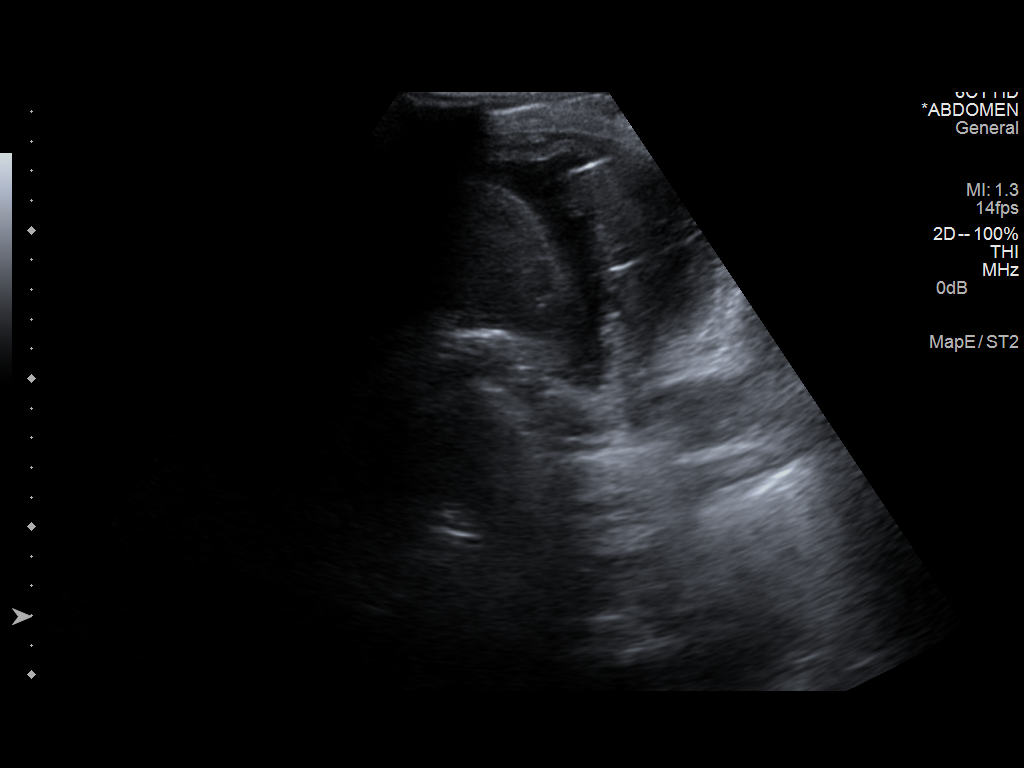

[6 of 6 positions shown; findings below may reference images not displayed]

FINDINGS: A total of approximately 650 mL of clear yellow fluid was removed. A
fluid sample was notsent for laboratory analysis.
IMPRESSION: Successful ultrasound guided left thoracentesis yielding 650 mL of
pleural fluid.

PROCEDURE:
An ultrasound guided thoracentesis was thoroughly discussed with the
patient and questions answered. The benefits, risks, alternatives
and complications were also discussed. The patient understands and
wishes to proceed with the procedure. Written consent was obtained.

Ultrasound survey of bilateral chest fields finds a severely
loculated effusion on the right. This is likely consistent with
patient's known history of lung cancer and prior placement of
pleural drainage catheter which likely caused mechanical
pleurodesis. The right-side is not amenable for thoracentesis.
Moderate-sized left-sided pleural effusion appears simple and
amenable for thoracentesis

Ultrasound was performed to localize and mark an adequate pocket of
fluid in the left chest. The area was then prepped and draped in the
normal sterile fashion. 1% Lidocaine was used for local anesthesia.
Under ultrasound guidance a 19 gauge Yueh catheter was introduced.
Thoracentesis was performed. The catheter was removed and a dressing
applied.

Complications:  None immediate
